# Patient Record
Sex: Female | Born: 1975 | Hispanic: Yes | Marital: Married | Smoking: Never smoker
Health system: Southern US, Community
[De-identification: ages and names within clinical notes are randomized; demographics above are authoritative.]

## PROBLEM LIST (undated history)

## (undated) ENCOUNTER — Emergency Department: Admission: EM | Discharge status: Absent without leave | Payer: BLUE CROSS/BLUE SHIELD

## (undated) DIAGNOSIS — E119 Type 2 diabetes mellitus without complications: Secondary | ICD-10-CM

## (undated) DIAGNOSIS — E781 Pure hyperglyceridemia: Secondary | ICD-10-CM

## (undated) DIAGNOSIS — K859 Acute pancreatitis without necrosis or infection, unspecified: Secondary | ICD-10-CM

## (undated) HISTORY — PX: ABDOMINAL HYSTERECTOMY: SHX81

## (undated) HISTORY — PX: LAPAROSCOPIC UNILATERAL SALPINGO OOPHERECTOMY: SHX5935

---

## 2004-12-04 ENCOUNTER — Emergency Department: Payer: Self-pay | Admitting: Emergency Medicine

## 2004-12-05 ENCOUNTER — Ambulatory Visit: Payer: Self-pay | Admitting: Emergency Medicine

## 2004-12-06 ENCOUNTER — Ambulatory Visit: Payer: Self-pay | Admitting: Internal Medicine

## 2007-12-07 ENCOUNTER — Emergency Department: Payer: Self-pay | Admitting: Unknown Physician Specialty

## 2011-04-27 ENCOUNTER — Emergency Department: Payer: Self-pay | Admitting: *Deleted

## 2011-04-27 LAB — COMPREHENSIVE METABOLIC PANEL
Albumin: 4 g/dL (ref 3.4–5.0)
Alkaline Phosphatase: 82 U/L (ref 50–136)
Anion Gap: 13 (ref 7–16)
BUN: 9 mg/dL (ref 7–18)
Bilirubin,Total: 0.3 mg/dL (ref 0.2–1.0)
Calcium, Total: 9.1 mg/dL (ref 8.5–10.1)
Chloride: 106 mmol/L (ref 98–107)
Creatinine: 0.65 mg/dL (ref 0.60–1.30)
EGFR (African American): >60
EGFR (Non-African Amer.): >60
Glucose: 102 mg/dL — ABNORMAL HIGH (ref 65–99)
Potassium: 3.6 mmol/L (ref 3.5–5.1)
SGPT (ALT): 41 U/L
Total Protein: 8.7 g/dL — ABNORMAL HIGH (ref 6.4–8.2)

## 2011-04-27 LAB — CBC
HGB: 12.7 g/dL (ref 12.0–16.0)
MCH: 29.7 pg (ref 26.0–34.0)
MCHC: 33.8 g/dL (ref 32.0–36.0)
MCV: 88 fL (ref 80–100)
Platelet: 336 10*3/uL (ref 150–440)

## 2011-07-03 ENCOUNTER — Emergency Department: Payer: Self-pay | Admitting: Emergency Medicine

## 2012-06-15 ENCOUNTER — Emergency Department: Payer: Self-pay | Admitting: Emergency Medicine

## 2013-11-01 ENCOUNTER — Emergency Department: Payer: Self-pay | Admitting: Emergency Medicine

## 2013-11-01 LAB — CBC WITH DIFFERENTIAL/PLATELET
BASOS ABS: 0.2 10*3/uL — AB (ref 0.0–0.1)
Basophil %: 1.2 %
EOS ABS: 0.2 10*3/uL (ref 0.0–0.7)
Eosinophil %: 1.2 %
HCT: 37.2 % (ref 35.0–47.0)
HGB: 12.6 g/dL (ref 12.0–16.0)
LYMPHS ABS: 3 10*3/uL (ref 1.0–3.6)
Lymphocyte %: 21.7 %
MCH: 29.7 pg (ref 26.0–34.0)
MCHC: 33.9 g/dL (ref 32.0–36.0)
MCV: 88 fL (ref 80–100)
Monocyte #: 0.7 x10 3/mm (ref 0.2–0.9)
Monocyte %: 5.1 %
NEUTROS PCT: 70.8 %
Neutrophil #: 9.8 10*3/uL — ABNORMAL HIGH (ref 1.4–6.5)
PLATELETS: 311 10*3/uL (ref 150–440)
RBC: 4.24 10*6/uL (ref 3.80–5.20)
RDW: 13 % (ref 11.5–14.5)
WBC: 13.9 10*3/uL — ABNORMAL HIGH (ref 3.6–11.0)

## 2013-11-01 LAB — COMPREHENSIVE METABOLIC PANEL
ALBUMIN: 2 g/dL — AB (ref 3.4–5.0)
Anion Gap: 9 (ref 7–16)
Bilirubin,Total: 0.8 mg/dL (ref 0.2–1.0)
CHLORIDE: 101 mmol/L (ref 98–107)
CO2: 24 mmol/L (ref 21–32)
GLUCOSE: 333 mg/dL — AB (ref 65–99)
Potassium: 4.7 mmol/L (ref 3.5–5.1)
SGOT(AST): 328 U/L — ABNORMAL HIGH (ref 15–37)
SGPT (ALT): 121 U/L — ABNORMAL HIGH
Sodium: 134 mmol/L — ABNORMAL LOW (ref 136–145)
Total Protein: 8 g/dL (ref 6.4–8.2)

## 2013-11-01 LAB — LIPASE, BLOOD: Lipase: 316 U/L (ref 73–393)

## 2013-11-01 LAB — TROPONIN I

## 2014-10-21 ENCOUNTER — Encounter: Payer: Self-pay | Admitting: Medical Oncology

## 2014-10-21 ENCOUNTER — Emergency Department: Payer: No Typology Code available for payment source

## 2014-10-21 ENCOUNTER — Emergency Department
Admission: EM | Admit: 2014-10-21 | Discharge: 2014-10-21 | Disposition: A | Discharge status: Home / Self Care | Payer: No Typology Code available for payment source | Attending: Emergency Medicine | Admitting: Emergency Medicine

## 2014-10-21 DIAGNOSIS — Z880 Allergy status to penicillin: Secondary | ICD-10-CM | POA: Insufficient documentation

## 2014-10-21 DIAGNOSIS — K5792 Diverticulitis of intestine, part unspecified, without perforation or abscess without bleeding: Secondary | ICD-10-CM | POA: Insufficient documentation

## 2014-10-21 DIAGNOSIS — F419 Anxiety disorder, unspecified: Secondary | ICD-10-CM | POA: Diagnosis not present

## 2014-10-21 DIAGNOSIS — R1032 Left lower quadrant pain: Secondary | ICD-10-CM | POA: Diagnosis present

## 2014-10-21 LAB — COMPREHENSIVE METABOLIC PANEL
ALBUMIN: 4.2 g/dL (ref 3.5–5.0)
ALT: 43 U/L (ref 14–54)
AST: 41 U/L (ref 15–41)
Alkaline Phosphatase: 67 U/L (ref 38–126)
Anion gap: 11 (ref 5–15)
BUN: 9 mg/dL (ref 6–20)
CHLORIDE: 104 mmol/L (ref 101–111)
CO2: 24 mmol/L (ref 22–32)
CREATININE: 0.46 mg/dL (ref 0.44–1.00)
Calcium: 9.5 mg/dL (ref 8.9–10.3)
GFR calc Af Amer: >60 mL/min (ref >60)
GLUCOSE: 113 mg/dL — AB (ref 65–99)
POTASSIUM: 3.8 mmol/L (ref 3.5–5.1)
SODIUM: 139 mmol/L (ref 135–145)
Total Bilirubin: 0.6 mg/dL (ref 0.3–1.2)
Total Protein: 8.1 g/dL (ref 6.5–8.1)

## 2014-10-21 LAB — URINALYSIS COMPLETE WITH MICROSCOPIC (ARMC ONLY)
BACTERIA UA: NONE SEEN
BILIRUBIN URINE: NEGATIVE
GLUCOSE, UA: NEGATIVE mg/dL
HGB URINE DIPSTICK: NEGATIVE
Ketones, ur: NEGATIVE mg/dL
Leukocytes, UA: NEGATIVE
Nitrite: NEGATIVE
PH: 5 (ref 5.0–8.0)
Protein, ur: NEGATIVE mg/dL
Specific Gravity, Urine: 1.025 (ref 1.005–1.030)

## 2014-10-21 LAB — CBC
HEMATOCRIT: 38.6 % (ref 35.0–47.0)
Hemoglobin: 12.8 g/dL (ref 12.0–16.0)
MCH: 29.4 pg (ref 26.0–34.0)
MCHC: 33.3 g/dL (ref 32.0–36.0)
MCV: 88.3 fL (ref 80.0–100.0)
PLATELETS: 275 10*3/uL (ref 150–440)
RBC: 4.37 MIL/uL (ref 3.80–5.20)
RDW: 12.6 % (ref 11.5–14.5)
WBC: 9.3 10*3/uL (ref 3.6–11.0)

## 2014-10-21 LAB — LIPASE, BLOOD: LIPASE: 24 U/L (ref 22–51)

## 2014-10-21 MED ORDER — MORPHINE SULFATE (PF) 4 MG/ML IV SOLN
4.0000 mg | Freq: Once | INTRAVENOUS | Status: AC
  Administered 2014-10-21: 4 mg via INTRAVENOUS
  Filled 2014-10-21: qty 1

## 2014-10-21 MED ORDER — METRONIDAZOLE 500 MG PO TABS: 500.0000 mg | ORAL_TABLET | Freq: Three times a day (TID) | ORAL | Status: AC

## 2014-10-21 MED ORDER — CIPROFLOXACIN HCL 500 MG PO TABS
500.0000 mg | ORAL_TABLET | ORAL | Status: AC
  Administered 2014-10-21: 500 mg via ORAL
  Filled 2014-10-21: qty 1

## 2014-10-21 MED ORDER — ONDANSETRON HCL 4 MG PO TABS: ORAL_TABLET | ORAL | Status: DC

## 2014-10-21 MED ORDER — HYDROCODONE-ACETAMINOPHEN 5-325 MG PO TABS: 1.0000 | ORAL_TABLET | ORAL | Status: DC | PRN

## 2014-10-21 MED ORDER — IOHEXOL 300 MG/ML  SOLN
100.0000 mL | Freq: Once | INTRAMUSCULAR | Status: AC | PRN
  Administered 2014-10-21: 100 mL via INTRAVENOUS

## 2014-10-21 MED ORDER — ONDANSETRON HCL 4 MG/2ML IJ SOLN
4.0000 mg | INTRAMUSCULAR | Status: AC
  Administered 2014-10-21: 4 mg via INTRAVENOUS
  Filled 2014-10-21: qty 2

## 2014-10-21 MED ORDER — CIPROFLOXACIN HCL 500 MG PO TABS: 500.0000 mg | ORAL_TABLET | Freq: Two times a day (BID) | ORAL | Status: AC

## 2014-10-21 MED ORDER — IOHEXOL 240 MG/ML SOLN
25.0000 mL | Freq: Once | INTRAMUSCULAR | Status: AC | PRN
  Administered 2014-10-21: 25 mL via ORAL

## 2014-10-21 MED ORDER — METRONIDAZOLE 500 MG PO TABS
500.0000 mg | ORAL_TABLET | Freq: Once | ORAL | Status: AC
  Administered 2014-10-21: 500 mg via ORAL
  Filled 2014-10-21: qty 1

## 2014-10-21 NOTE — ED Notes (Signed)
Via interpreter: Pt reports that she was recently diagnosed with a vaginal infection at [facility], was placed on abx. Since then pt has been having worsening left sided lower abd pain that is now radiating to left flank.

## 2014-10-21 NOTE — Discharge Instructions (Signed)
Diverticulitis (Diverticulitis) La diverticulitis es la inflamacin o infeccin de pequeas bolsas en el colon que se forman por una afeccin llamada diverticulosis. Las bolsas en el colon se denominan divertculos. El colon, o intestino grueso, es el lugar donde se absorbe agua y se forman las heces. Entre las complicaciones de la diverticulitis, se incluyen:  Hemorragias.  Infeccin grave.  Dolor intenso.  Perforacin del colon.  Obstruccin del colon. CAUSAS  La diverticulitis est causada por bacterias y La diverticulitis se produce cuando queda materia fecal retenida en los divertculos. Esto permite que crezcan bacterias en los divertculos, lo que puede llevar a la inflamacin e infeccin. FACTORES DE RIESGO Las personas con diverticulosis corren riesgo de presentar diverticulitis. Las dietas que no incluyen suficiente fibra proveniente de frutas y vegetales pueden predisponer a la diverticulitis. SNTOMAS  Entre los sntomas de diverticulitis, se incluyen:  Dolor y molestias en el abdomen. El dolor en general aparece en el lado izquierdo del abdomen, pero puede aparecer en otras zonas.  Fiebre o escalofros  [place].  Clicos.  Nuseas.  Vmitos.  Estreimiento.  Diarrea.  Sangre en la materia fecal. DIAGNSTICO  El mdico le preguntar acerca de sus antecedentes mdicos y le har un examen fsico. Es posible que le hagan estudios, porque hay muchas enfermedades que pueden causar los mismos sntomas que la diverticulitis. Los estudios pueden incluir:  Anlisis de sangre.  Anlisis de orina.  Estudios por imgenes del abdomen, entre ellos, radiografas y tomografas computarizadas. Cuando la afeccin est controlada, el mdico puede recomendarle que se haga una colonoscopa. La colonoscopa puede mostrar la gravedad de los divertculos y si hay algo ms que est causando los sntomas. TRATAMIENTO  La mayora de los casos de diverticulitis son leves y pueden  tratarse en el hogar. El tratamiento puede incluir:  Tomar medicamentos para el dolor de venta libre.  Seguir una dieta lquida absoluta.  Tomar antibiticos por va oral durante 7a 10das. Los casos ms graves pueden tratarse en el hospital. El tratamiento puede incluir:  No comer ni beber nada.  Tomar medicamentos para el dolor recetados.  Recibir antibiticos a travs de una va IV.  Recibir lquidos y nutricin a travs de una va IV.  Ciruga. INSTRUCCIONES PARA EL CUIDADO EN EL HOGAR   Siga cuidadosamente las indicaciones del mdico.  Siga las indicaciones del mdico acerca de si debe consumir una dieta lquida o de otro tipo. Cuando los sntomas mejoren, el mdico puede indicarle que modifique la dieta y recomendarle que consuma una dieta con alto contenido de fibra. Las frutas y los vegetales son buenas fuentes de fibra. La fibra facilita la eliminacin de heces.  Tome los suplementos con fibra o los probiticos segn las indicaciones del mdico.  Tome los medicamentos solamente como se lo haya indicado el mdico.  Cumpla con todas las visitas de control. SOLICITE ATENCIN MDICA SI:   El dolor no mejora.  Le resulta difcil alimentarse.  Los movimientos intestinales no se normalizan. SOLICITE ATENCIN MDICA DE INMEDIATO SI:   El dolor empeora.  Los sntomas no mejoran.  Los sntomas empeoran repentinamente.  Tiene fiebre.  Ha vomitado repetidas veces.  La materia fecal es sanguinolenta o negra, de aspecto alquitranado. ASEGRESE DE QUE:   Comprende estas instrucciones.  Controlar su afeccin.  Recibir ayuda de inmediato si no mejora o si empeora. Document Released: 11/20/2004 Document Revised: 02/15/2013 [employer] Patient Information 2015 [employer]. This information is not intended to replace advice given to you by your health care   provider. Make sure you discuss any questions you have with your health care provider. ° °

## 2014-10-21 NOTE — ED Provider Notes (Signed)
[facility] Emergency Department Provider Note  ____________________________________________  Time seen: Approximately 6:12 PM  I have reviewed the triage vital signs and the nursing notes.   HISTORY  Chief Complaint Abdominal Pain  A hospital Spanish interpreter was utilized for this patient encounter  HPI Sabrina Hanna is a 39 y.o. female with a past medical history of ovarian cysts and fibroids status post hysterectomy and right-sided oophorectomy about 2 years ago at [facility].  She presents now with 2 weeks of gradual onset but worsening left lower abdominal pain that is radiating to her left flank.  She was recently treated for a vaginal infection at [facility] but continues to have pain.  She is not having any vaginal discharge or vaginal bleeding.She denies dysuria and states that her urine does not have a foul order odor.  She is had some nausea but no vomiting.  She is very concerned and anxious about the pain and her husband is concerned that she may have cancer.  Movement makes the pain worse and nothing makes it better.  She has been taking ibuprofen but it is not helping her.   History reviewed. No pertinent past medical history.  There are no active problems to display for this patient.   Past Surgical History  Procedure Laterality Date  . Abdominal hysterectomy    . Laparoscopic unilateral salpingo oopherectomy Right     Current Outpatient Rx  Name  Route  Sig  Dispense  Refill  . ciprofloxacin (CIPRO) 500 MG tablet   Oral   Take 1 tablet (500 mg total) by mouth 2 (two) times daily.   20 tablet   0   . HYDROcodone-acetaminophen (NORCO/VICODIN) 5-325 MG per tablet   Oral   Take 1-2 tablets by mouth every 4 (four) hours as needed for moderate pain.   15 tablet   0   . metroNIDAZOLE (FLAGYL) 500 MG tablet   Oral   Take 1 tablet (500 mg total) by mouth 3 (three) times daily.   30 tablet   0   . ondansetron (ZOFRAN) 4 MG tablet     Take 1-2 tabs by mouth every 8 hours as needed for nausea/vomiting   30 tablet   0     Allergies Penicillins  History reviewed. No pertinent family history.  Social History Social History  Substance Use Topics  . Smoking status: Never Smoker   . Smokeless tobacco: None  . Alcohol Use: No    Review of Systems Constitutional: Intermittent subjective fever/chills Eyes: No visual changes. ENT: No sore throat. Cardiovascular: Denies chest pain. Respiratory: Denies shortness of breath. Gastrointestinal: Severe gradual worsening left lower quadrant abdominal pain with nausea, no vomiting.  No diarrhea.  No constipation. Genitourinary: Negative for dysuria. Musculoskeletal: Negative for back pain. Skin: Negative for rash. Neurological: Negative for headaches, focal weakness or numbness.  10-point ROS otherwise negative.  ____________________________________________   PHYSICAL EXAM:  VITAL SIGNS: ED Triage Vitals  Enc Vitals Group     BP 10/21/14 1609 115/75 mmHg     Pulse Rate 10/21/14 1609 76     Resp 10/21/14 1609 18     Temp 10/21/14 1609 97.7 F (36.5 C)     Temp Source 10/21/14 1609 Oral     SpO2 10/21/14 1609 98 %     Weight 10/21/14 1609 161 lb (73.029 kg)     Height 10/21/14 1609 5\' 2"  (1.575 m)     Head Cir --      Peak Flow --  Pain Score 10/21/14 1610 10     Pain Loc --      Pain Edu? --      Excl. in GC? --     Constitutional: Alert and oriented. Well appearing and in no acute distress.  Anxious Eyes: Conjunctivae are normal. PERRL. EOMI. Head: Atraumatic. Nose: No congestion/rhinnorhea. Mouth/Throat: Mucous membranes are moist.  Oropharynx non-erythematous. Neck: No stridor.   Cardiovascular: Normal rate, regular rhythm. Grossly normal heart sounds.  Good peripheral circulation. Respiratory: Normal respiratory effort.  No retractions. Lungs CTAB. Gastrointestinal: Soft with tenderness to palpation primarily in the suprapubic region and left  lower quadrant.  Guarding, no rebound. Musculoskeletal: No lower extremity tenderness nor edema.  No joint effusions. Neurologic:  Normal speech and language. No gross focal neurologic deficits are appreciated.  Skin:  Skin is warm, dry and intact. No rash noted.   ____________________________________________   LABS (all labs ordered are listed, but only abnormal results are displayed)  Labs Reviewed  COMPREHENSIVE METABOLIC PANEL - Abnormal; Notable for the following:    Glucose, Bld 113 (*)    All other components within normal limits  URINALYSIS COMPLETEWITH MICROSCOPIC ([facility] ONLY) - Abnormal; Notable for the following:    Color, Urine YELLOW (*)    APPearance CLEAR (*)    Squamous Epithelial / LPF 0-5 (*)    All other components within normal limits  LIPASE, BLOOD  CBC   ____________________________________________  EKG  Not indicated ____________________________________________  RADIOLOGY   Ct Abdomen Pelvis W Contrast  10/21/2014   CLINICAL DATA:  LEFT lower quadrant pain, recently diagnosed with vaginal infection, on antibiotics but pain has increased, past history of partial hysterectomy and RIGHT oophorectomy  EXAM: CT ABDOMEN AND PELVIS WITH CONTRAST  TECHNIQUE: Multidetector CT imaging of the abdomen and pelvis was performed using the standard protocol following bolus administration of intravenous contrast.  CONTRAST:  OMNIPAQUE IOHEXOL 300 MG/ML SOLN IV. Dilute oral contrast.  COMPARISON:  11/01/2013  FINDINGS: Lung bases clear.  Diffuse fatty infiltration of liver.  Liver, spleen, gallbladder, pancreas, kidneys, and adrenal glands normal.  Focal pericolic inflammation identified adjacent to the descending colon with note of a few scattered colonic diverticula, question diverticulitis.  No evidence of perforation or abscess.  Stomach and remaining bowel loops unremarkable.  Uterus surgically absent with nonvisualization of LEFT ovary ; RIGHT ovary is grossly  normal in appearance and unchanged.  Unremarkable bladder and ureters.  No mass, adenopathy, free air, free fluid, hernia, or acute bone lesion.  IMPRESSION: Minimal wall thickening and pericolic inflammatory changes at the mid descending colon most consistent with acute diverticulitis.  Fatty infiltration of liver.   Electronically Signed   By: Ulyses Southward M.D.   On: 10/21/2014 19:47    ____________________________________________   PROCEDURES  Procedure(s) performed: None  Critical Care performed: No ____________________________________________   INITIAL IMPRESSION / ASSESSMENT AND PLAN / ED COURSE  Pertinent labs & imaging results that were available during my care of the patient were reviewed by me and considered in my medical decision making (see chart for details).  The patient's CT scan is consistent with acute diverticulitis with no free air or abscess formation.  Her lab work was all reassuring including no evidence of UTI and no evidence of systemic infection (no leukocytosis).  Using the Spanish interpreter I updated the patient and her husband about the findings and explained that I was prescribing antibiotics and pain medicine.  I explained that she should return to the emergency  department if she gets worse or if she is not improved significantly within a few days.  They understand and agree with the plan.  ____________________________________________  FINAL CLINICAL IMPRESSION(S) / ED DIAGNOSES  Final diagnoses:  Acute diverticulitis      NEW MEDICATIONS STARTED DURING THIS VISIT:  Discharge Medication List as of 10/21/2014  8:44 PM    START taking these medications   Details  ciprofloxacin (CIPRO) 500 MG tablet Take 1 tablet (500 mg total) by mouth 2 (two) times daily., Starting 10/21/2014, Until Sat 10/28/14, Print    HYDROcodone-acetaminophen (NORCO/VICODIN) 5-325 MG per tablet Take 1-2 tablets by mouth every 4 (four) hours as needed for moderate pain., Starting  10/21/2014, Until Discontinued, Print    metroNIDAZOLE (FLAGYL) 500 MG tablet Take 1 tablet (500 mg total) by mouth 3 (three) times daily., Starting 10/21/2014, Until Sat 11/04/14, Print    ondansetron (ZOFRAN) 4 MG tablet Take 1-2 tabs by mouth every 8 hours as needed for nausea/vomiting, Print         Loleta Rose, MD 10/21/14 2230

## 2014-10-23 ENCOUNTER — Encounter: Payer: Self-pay | Admitting: Emergency Medicine

## 2014-10-23 ENCOUNTER — Emergency Department
Admission: EM | Admit: 2014-10-23 | Discharge: 2014-10-23 | Disposition: A | Discharge status: Home / Self Care | Payer: No Typology Code available for payment source | Attending: Emergency Medicine | Admitting: Emergency Medicine

## 2014-10-23 DIAGNOSIS — G8929 Other chronic pain: Secondary | ICD-10-CM | POA: Insufficient documentation

## 2014-10-23 DIAGNOSIS — Z880 Allergy status to penicillin: Secondary | ICD-10-CM | POA: Diagnosis not present

## 2014-10-23 DIAGNOSIS — R1030 Lower abdominal pain, unspecified: Secondary | ICD-10-CM | POA: Insufficient documentation

## 2014-10-23 DIAGNOSIS — R109 Unspecified abdominal pain: Secondary | ICD-10-CM | POA: Diagnosis present

## 2014-10-23 LAB — COMPREHENSIVE METABOLIC PANEL
ALBUMIN: 4.1 g/dL (ref 3.5–5.0)
ALK PHOS: 72 U/L (ref 38–126)
ALT: 55 U/L — ABNORMAL HIGH (ref 14–54)
AST: 68 U/L — AB (ref 15–41)
Anion gap: 13 (ref 5–15)
BILIRUBIN TOTAL: 0.7 mg/dL (ref 0.3–1.2)
BUN: 9 mg/dL (ref 6–20)
CALCIUM: 9 mg/dL (ref 8.9–10.3)
CO2: 19 mmol/L — ABNORMAL LOW (ref 22–32)
Chloride: 105 mmol/L (ref 101–111)
Creatinine, Ser: 0.64 mg/dL (ref 0.44–1.00)
GFR calc Af Amer: >60 mL/min (ref >60)
GLUCOSE: 322 mg/dL — AB (ref 65–99)
Potassium: 4.5 mmol/L (ref 3.5–5.1)
Sodium: 137 mmol/L (ref 135–145)
TOTAL PROTEIN: 7.8 g/dL (ref 6.5–8.1)

## 2014-10-23 LAB — CBC WITH DIFFERENTIAL/PLATELET
BASOS ABS: 0 10*3/uL (ref 0–0.1)
BASOS PCT: 0 %
Eosinophils Absolute: 0.1 10*3/uL (ref 0–0.7)
Eosinophils Relative: 2 %
HEMATOCRIT: 39.5 % (ref 35.0–47.0)
HEMOGLOBIN: 13.1 g/dL (ref 12.0–16.0)
LYMPHS PCT: 27 %
Lymphs Abs: 1.9 10*3/uL (ref 1.0–3.6)
MCH: 29.8 pg (ref 26.0–34.0)
MCHC: 33.2 g/dL (ref 32.0–36.0)
MCV: 89.6 fL (ref 80.0–100.0)
MONOS PCT: 5 %
Monocytes Absolute: 0.3 10*3/uL (ref 0.2–0.9)
NEUTROS ABS: 4.6 10*3/uL (ref 1.4–6.5)
NEUTROS PCT: 66 %
Platelets: 234 10*3/uL (ref 150–440)
RBC: 4.41 MIL/uL (ref 3.80–5.20)
RDW: 12.8 % (ref 11.5–14.5)
WBC: 7 10*3/uL (ref 3.6–11.0)

## 2014-10-23 LAB — LIPASE, BLOOD: Lipase: 29 U/L (ref 22–51)

## 2014-10-23 MED ORDER — HYDROMORPHONE HCL 1 MG/ML IJ SOLN
1.0000 mg | Freq: Once | INTRAMUSCULAR | Status: AC
  Administered 2014-10-23: 1 mg via INTRAVENOUS
  Filled 2014-10-23: qty 1

## 2014-10-23 NOTE — ED Notes (Signed)
Pt presents with abd pain since Saturday. Pt was seen here on Saturday for same and dx with diverticulitis. Pt was given zofran, flagyl, cipro and hydrocodone at time of disacharge. Was told to return if no better in 48hrs.

## 2014-10-23 NOTE — ED Notes (Signed)
Interpreter here to help with discharge of patient. Patient and spouse asked questions and were given answers via interpreter.

## 2014-10-23 NOTE — ED Notes (Signed)
Patient medicated for pain. Resting quietly, watching TV, husband at bedside.

## 2014-10-23 NOTE — Discharge Instructions (Signed)
Dolor abdominal en las mujeres  (Abdominal Pain, Women)  El dolor abdominal (en el estómago, la pelvis o el vientre) puede tener muchas causas. Es importante que le informe a su médico:  · La ubicación del dolor.  · ¿Viene y se va, o persiste todo el tiempo?  · ¿Hay situaciones que inician el dolor (comer ciertos alimentos, la actividad física)?  · ¿Tiene otros síntomas asociados al dolor (fiebre, náuseas, vómitos, diarrea)?  Todo es de gran ayuda cuando se trata de hallar la causa del dolor.  CAUSAS  · Estómago: Infecciones por virus o bacterias, o úlcera.  · Intestino: Apendicitis (apéndice inflamado), ileitis regional (enfermedad de Crohn), colitis ulcerosa (colon inflamado), síndrome del colon irritable, diverticulitis (inflamación de los divertículos del colon) o cáncer de estómago oo intestino.  · Enfermedades de la vesícula biliar o cálculos.  · Enfermedades renales, cálculos o infecciones en el riñón.  · Infección o cáncer del páncreas.  · Fibromialgia (trastorno doloroso)  · Enfermedades de los órganos femeninos:  · Uterus: Útero: fibroma (tumor no canceroso) o infección  · Trompas de [place]: infección o embarazo ectópico  · En los ovarios, quistes o tumores.  · Adherencias pélvicas (tejido cicatrizal).  · Endometriosis (el tejido que cubre el útero se desarrolla en la pelvis y los órganos pélvicos).  · Síndrome de congestión pélvica (los órganos femeninos se llenan de sangre antes del periodo menstrual(  · Dolor durante el periodo menstrual.  · Dolor durante la ovulación (al producir óvulos).  · Dolor al usar el DIU (dispositivo intrauterino para el control de la natalidad)  · Cáncer en los órganos femeninos.  · Dolor funcional (no está originado en una enfermedad, puede mejorar sin tratamiento).  · Dolor de origen psicológico  · Depresión.  DIAGNÓSTICO  Su médico decidirá la gravedad del dolor a través del examen físico  · Análisis de sangre  · Radiografías  · Ecografías  · TC (tomografía computada, tipo  especial de radiografías).  · IMR (resonancia magnética)  · Cultivos, en el caso una infección  · Colon por enema de bario (se inserta una sustancia de contraste en el intestino grueso para mejorar la observación con rayos X.)  · Colonoscopía (observación del intestino con un tubo luminoso).  · Laparoscopía (examen del interior del abdomen con un tubo que tiene una luz).  · Cirugía exploratoria abdominal mayor (se observa el abdomen realizando una gran incisión).  TRATAMIENTO  El tratamiento dependerá de la causa del problema.   · Muchos de estos casos pueden controlarse y tratarse en casa.  · Medicamentos de venta libre indicados por el médico.  · Medicamentos con receta.  · Antibióticos, en caso de infección  · Píldoras anticonceptivas, en el caso de períodos dolorosos o dolor al ovular.  · Tratamiento hormonal, para la endometriosis  · Inyecciones para bloqueo nervioso selectivo.  · Fisioterapia.  · Antidepresivos.  · Consejos por parte de un psícólogo o psiquiatra.  · Cirugía mayor o menor.  INSTRUCCIONES PARA EL CUIDADO DOMICILIARIO  · No tome ni administre laxantes a menos que se lo haya indicado su médico.  · Tome analgésicos de venta libre sólo si se lo ha indicado el profesional que lo asiste. No tome aspirina, ya que puede causar molestias en el estómago o hemorragias.  · Consuma una dieta líquida (caldo o agua) según lo indicado por el médico. Progrese lentamente a una dieta blanda, según la tolerancia, si el dolor se relaciona con el estómago o el intestino.  ·   no se [employer] o la [place] tratar con:  Acupuntura.  Ejercicios de relajacin (yoga,  meditacin).  Terapia grupal.  Psicoterapia. SOLICITE ATENCIN MDICA SI:  Nota que ciertos Pharmacist, community de [place].  El tratamiento indicado para Arboriculturist no Marketing executive.  Necesita analgsicos ms fuertes.  Quiere que le retiren el DIU.  Si se siente confundido o desfalleciente.  Presenta nuseas o vmitos.  Aparece una erupcin cutnea.  Sufre efectos adversos o una reaccin alrgica debido a los medicamentos que toma. SOLICITE ATENCIN MDICA DE INMEDIATO SI:  El dolor persiste o se agrava.  Tiene fiebre.  Siente el dolor slo en algunos sectores del abdomen. Si se localiza en la zona derecha, posiblemente podra tratarse de apendicitis. En un adulto, si se localiza en la regin inferior izquierda del abdomen, podra tratarse de colitis o diverticulitis.  Hay sangre en las heces (deposiciones de color rojo brillante o negro alquitranado), con o sin vmitos.  Usted presenta sangre en la orina.  Siente escalofros con o sin fiebre.  Se desmaya. ASEGRESE QUE:   Comprende estas instrucciones.  Controlar su enfermedad.  Solicitar ayuda de inmediato si no mejora o si empeora. Document Released: 05/29/2008 Document Revised: 05/05/2011 [facility] Patient Information 2015 [place], [place]. This information is not intended to replace advice given to you by your health care provider. Make sure you discuss any questions you have with your health care provider. Dolor crnico (Chronic Pain) El dolor crnico puede definirse como aquel que aparece y desaparece, y dura entre 3 y 6 meses o ms. Puede tener muchas causas, lo que hace difcil realizar un diagnstico. Hay muchas opciones de tratamiento disponibles. Sin embargo, Editor, commissioning un tratamiento que resulte bien para usted puede requerir diferentes abordajes hasta que se encuentre el [place]. Muchas personas se benefician con una combinacin de uno o ms tipos de tratamiento para Museum/gallery conservator. SNTOMAS  El dolor crnico puede producirse en cualquier parte del organismo y puede variar desde leve a muy intenso. Algunos tipos de dolor crnico son:  Dolor de [place].  Dolor lumbar.  Dolor por cncer.  Dolor por artritis.  Dolor neurognico. Este ltimo dolor es el resultado de lesiones nerviosas. Las personas que sufren dolor crnico tambin pueden tener otros sntomas como:  Depresin.  Enojo.  Insomnio.  Ansiedad. DIAGNSTICO  El medico har el diagnstico de la afeccin luego de un [place]. En muchos casos, el foco inicial estar en excluir trastornos que sean causa de dolor. El mdico indicar algunos anlisis para diagnosticar el problema, segn los sntomas que [place]. Entre estas pruebas se incluyen:   Anlisis de [place].   Tomografa computada.   Resonancia magntica.   Radiografas.   Ecografas.   Estudios de [employer].  Es posible que deba consultar a Music therapist.  TRATAMIENTO  Hallar un tratamiento que de resultado puede llevar mucho tiempo. Posiblemente lo derivarn a un especialista. El especialista podr indicarle medicamentos o terapias, como:   Meditacin de atencin plena o yoga.  Medicamentos o inyecciones de anestsicos o analgsicos en la columna vertebral o en la zona que la rodea.  Estimulacin elctrica local.  Acupuntura.   Terapia de masajes.   Terapia con aromas, colores, luz o sonidos.   Biorretroalimentacin.   Trabajo con un fisioterapeuta para Technical sales engineer rigidez.   Ejercicios regulares y suaves.   Terapia cognitivo conductual.   Grupos de apoyo.  En algunos casos podr indicarse la [place].  INSTRUCCIONES  PARA EL CUIDADO EN EL HOGAR   Tome todos los [employer] como le indic el mdico.   Disminuya el estrs en su vida relajndose y haciendo ciertas cosas, como escuchar msica tranquila.   Haga ejercicios o actividades segn las indicaciones del mdico.   Consuma una  dieta saludable e incluya vegetales, frutas, pescado y carnes [place].   Cumpla con todas las visitas de control, segn le indique su mdico.   Asista a un grupo de apoyo con otras personas que sufren de dolor crnico. SOLICITE ATENCIN MDICA SI:   El dolor empeora.   Siente un nuevo dolor, que no tena antes.   No tolera los medicamentos que le indic el mdico.   Presenta nuevos sntomas desde la ltima consulta al mdico.  SOLICITE ATENCIN MDICA DE INMEDIATO SI:   Se siente dbil.   Tiene menos sensibilidad o siente adormecimientos.   Pierde el control de la vejiga o del intestino.   El dolor empeora repentinamente.   Comienza a tener temblores.  Comienza a sentir escalofros.  Se siente confundido.  Siente dolor en el pecho.  Le falta el aire.  ASEGRESE DE QUE:  Comprende estas instrucciones.  Controlar su afeccin.  Recibir ayuda de inmediato si no mejora o si empeora. Document Released: 02/10/2005 Document Revised: 10/13/2012 [facility] Patient Information 2015 [place], [place]. This information is not intended to replace advice given to you by your health care provider. Make sure you discuss any questions you have with your health care provider.

## 2014-10-23 NOTE — ED Provider Notes (Signed)
[facility] Emergency Department Provider Note     Time seen: ----------------------------------------- 10:22 AM on 10/23/2014 -----------------------------------------    I have reviewed the triage vital signs and the nursing notes.   HISTORY  Chief Complaint Abdominal Pain    HPI Sabrina Hanna is a 39 y.o. female who presents to ER with abdominal pain since Saturday. Patient states she's been here on Saturday for same and was diagnosed with diverticulitis. She was given Zofran Flagyl Cipro and Vicodin. Was told to return if no better in 48 hours. Patient states she also has had some chronic abdominal pain is been seen at [facility] for same. Nothing makes her symptoms better or worse, also associated back pain.   History reviewed. No pertinent past medical history.  There are no active problems to display for this patient.   Past Surgical History  Procedure Laterality Date  . Abdominal hysterectomy    . Laparoscopic unilateral salpingo oopherectomy Right     Allergies Penicillins  Social History Social History  Substance Use Topics  . Smoking status: Never Smoker   . Smokeless tobacco: None  . Alcohol Use: No    Review of Systems Constitutional: Negative for fever. Eyes: Negative for visual changes. ENT: Negative for sore throat. Cardiovascular: Negative for chest pain. Respiratory: Negative for shortness of breath. Gastrointestinal: Positive for abdominal pain Genitourinary: Negative for dysuria. Musculoskeletal: Positive for back pain Skin: Negative for rash. Neurological: Negative for headaches, focal weakness or numbness.  10-point ROS otherwise negative.  ____________________________________________   PHYSICAL EXAM:  VITAL SIGNS: ED Triage Vitals  Enc Vitals Group     BP 10/23/14 1002 116/77 mmHg     Pulse Rate 10/23/14 1002 92     Resp 10/23/14 1002 18     Temp 10/23/14 1002 98.9 F (37.2 C)     Temp Source  10/23/14 1002 Oral     SpO2 10/23/14 1002 97 %     Weight --      Height --      Head Cir --      Peak Flow --      Pain Score 10/23/14 1003 8     Pain Loc --      Pain Edu? --      Excl. in GC? --     Constitutional: Alert and oriented. Well appearing and in no distress. Eyes: Conjunctivae are normal. PERRL. Normal extraocular movements. ENT   Head: Normocephalic and atraumatic.   Nose: No congestion/rhinnorhea.   Mouth/Throat: Mucous membranes are moist.   Neck: No stridor. Cardiovascular: Normal rate, regular rhythm. Normal and symmetric distal pulses are present in all extremities. No murmurs, rubs, or gallops. Respiratory: Normal respiratory effort without tachypnea nor retractions. Breath sounds are clear and equal bilaterally. No wheezes/rales/rhonchi. Gastrointestinal: Severe abdominal tenderness, out of proportion to exam. Worse on the left side of the abdomen.  Musculoskeletal: Nontender with normal range of motion in all extremities. No joint effusions.  No lower extremity tenderness nor edema. Neurologic:  Normal speech and language. No gross focal neurologic deficits are appreciated. Speech is normal. No gait instability. Skin:  Skin is warm, dry and intact. No rash noted. Psychiatric: Mood and affect are normal. Speech and behavior are normal. Patient exhibits appropriate insight and judgment.  ____________________________________________  ED COURSE:  Pertinent labs & imaging results that were available during my care of the patient were reviewed by me and considered in my medical decision making (see chart for details). Unclear etiology, likely chronic  pain management  ____________________________________________    LABS (pertinent positives/negatives)  Labs Reviewed  COMPREHENSIVE METABOLIC PANEL - Abnormal; Notable for the following:    CO2 19 (*)    Glucose, Bld 322 (*)    AST 68 (*)    ALT 55 (*)    All other components within normal limits   CBC WITH DIFFERENTIAL/PLATELET  LIPASE, BLOOD  URINALYSIS COMPLETEWITH MICROSCOPIC ([facility] ONLY)     ____________________________________________  FINAL ASSESSMENT AND PLAN  Chronic abdominal pain  Plan: Patient with labs and imaging as dictated above. Patient with chronic pain management, advised I do not have an exact etiology for her in terms of this time. She is stable for outpatient follow-up with [facility] chronic pelvic pain   Emily Filbert, MD   Emily Filbert, MD 10/23/14 [phone]

## 2014-10-23 NOTE — ED Notes (Signed)
Pt presents to ED with central and left side abdominal pain. Was seen here Saturday and instructed to return in 48 hours if the pain was worse. Pt states pain is constant but has worsened in the last month. Pt reports being seen at [facility] 2 weeks ago for a vaginal infection and has completed the antibiotics prescribed.

## 2015-10-05 ENCOUNTER — Emergency Department
Admission: EM | Admit: 2015-10-05 | Discharge: 2015-10-06 | Disposition: A | Discharge status: Home / Self Care | Payer: BLUE CROSS/BLUE SHIELD | Attending: Emergency Medicine | Admitting: Emergency Medicine

## 2015-10-05 ENCOUNTER — Emergency Department: Payer: BLUE CROSS/BLUE SHIELD

## 2015-10-05 ENCOUNTER — Encounter: Payer: Self-pay | Admitting: Emergency Medicine

## 2015-10-05 ENCOUNTER — Other Ambulatory Visit: Payer: Self-pay

## 2015-10-05 DIAGNOSIS — R079 Chest pain, unspecified: Secondary | ICD-10-CM

## 2015-10-05 DIAGNOSIS — R739 Hyperglycemia, unspecified: Secondary | ICD-10-CM

## 2015-10-05 DIAGNOSIS — E1165 Type 2 diabetes mellitus with hyperglycemia: Secondary | ICD-10-CM | POA: Diagnosis not present

## 2015-10-05 DIAGNOSIS — R0789 Other chest pain: Secondary | ICD-10-CM | POA: Diagnosis present

## 2015-10-05 HISTORY — DX: Type 2 diabetes mellitus without complications: E11.9

## 2015-10-05 LAB — TROPONIN I

## 2015-10-05 LAB — CBC
HCT: 35 % (ref 35.0–47.0)
Hemoglobin: 12.4 g/dL (ref 12.0–16.0)
MCH: 31.2 pg (ref 26.0–34.0)
MCHC: 35.5 g/dL (ref 32.0–36.0)
MCV: 87.8 fL (ref 80.0–100.0)
PLATELETS: 228 10*3/uL (ref 150–440)
RBC: 3.99 MIL/uL (ref 3.80–5.20)
RDW: 13 % (ref 11.5–14.5)
WBC: 12.4 10*3/uL — AB (ref 3.6–11.0)

## 2015-10-05 LAB — BASIC METABOLIC PANEL
Anion gap: 9 (ref 5–15)
BUN: 12 mg/dL (ref 6–20)
CHLORIDE: 102 mmol/L (ref 101–111)
CO2: 23 mmol/L (ref 22–32)
CREATININE: 0.65 mg/dL (ref 0.44–1.00)
Calcium: 8.7 mg/dL — ABNORMAL LOW (ref 8.9–10.3)
Glucose, Bld: 297 mg/dL — ABNORMAL HIGH (ref 65–99)
Potassium: 3.5 mmol/L (ref 3.5–5.1)
SODIUM: 134 mmol/L — AB (ref 135–145)

## 2015-10-05 MED ORDER — SODIUM CHLORIDE 0.9 % IV BOLUS (SEPSIS)
1000.0000 mL | Freq: Once | INTRAVENOUS | Status: AC
  Administered 2015-10-05: 1000 mL via INTRAVENOUS

## 2015-10-05 MED ORDER — GI COCKTAIL ~~LOC~~
ORAL | Status: AC
  Administered 2015-10-05: 30 mL via ORAL
  Filled 2015-10-05: qty 30

## 2015-10-05 MED ORDER — DIPHENHYDRAMINE HCL 50 MG/ML IJ SOLN
50.0000 mg | Freq: Once | INTRAMUSCULAR | Status: AC
Start: 2015-10-06 — End: 2015-10-05
  Administered 2015-10-05: 50 mg via INTRAVENOUS

## 2015-10-05 MED ORDER — GI COCKTAIL ~~LOC~~
30.0000 mL | Freq: Once | ORAL | Status: AC
  Administered 2015-10-05: 30 mL via ORAL

## 2015-10-05 NOTE — ED Triage Notes (Signed)
Pt. States chest pain and shortness of breath that started at around 1 pm today.  Pt. States substernal chest pain that does not radiate.  Pt. States hx of acid reflux.

## 2015-10-05 NOTE — ED Notes (Signed)
2 unsuccessful PIV attempts by this RN. 

## 2015-10-05 NOTE — ED Notes (Signed)
Called to pt's room for c/o trouble breathing and possible reaction to GI cocktail. Manson PasseyBrown, MD at bedside.

## 2015-10-05 NOTE — ED Notes (Signed)
Pt placed on 2L O2 via [place] for comfort. Pt's O2 sats at 100%.

## 2015-10-05 NOTE — ED Notes (Addendum)
Pt states she drank a Monster energy drink around 11am and thinks it might have caused her chest pain. Pt states she drank a Red Bull in the past which caused similar s/x's.

## 2015-10-06 LAB — TROPONIN I: Troponin I: <0.03 ng/mL (ref <0.03)

## 2015-10-06 LAB — GLUCOSE, CAPILLARY: GLUCOSE-CAPILLARY: 176 mg/dL — AB (ref 65–99)

## 2015-10-06 LAB — FIBRIN DERIVATIVES D-DIMER (ARMC ONLY): Fibrin derivatives D-dimer (ARMC): 187 (ref 0–499)

## 2015-10-06 MED ORDER — METFORMIN HCL 500 MG PO TABS: 500.0000 mg | ORAL_TABLET | Freq: Two times a day (BID) | ORAL | 0 refills | Status: DC

## 2015-10-06 MED ORDER — SODIUM CHLORIDE 0.9 % IV BOLUS (SEPSIS)
1000.0000 mL | Freq: Once | INTRAVENOUS | Status: AC
  Administered 2015-10-06: 1000 mL via INTRAVENOUS

## 2015-10-06 NOTE — ED Provider Notes (Signed)
[facility] Emergency Department Provider Note  ____________________________________________   First MD Initiated Contact with Patient 10/06/15 0000     (approximate)  I have reviewed the triage vital signs and the nursing notes.   HISTORY  Chief Complaint Chest Pain (Pt. presents to ED from triage for chest pain and shortness of breath.)    HPI Sabrina Hanna is a 40 y.o. female history GERD and diabetes (noncompliant with medication) presents with central chest pain described as pressure since 1 PM this afternoon. Patient denies any accompanying symptoms no dyspnea diaphoresis nausea or vomiting. Patient denies any B pain or swelling.   Past Medical History:  Diagnosis Date  . Diabetes mellitus without complication (HCC)     There are no active problems to display for this patient.   Past Surgical History:  Procedure Laterality Date  . ABDOMINAL HYSTERECTOMY    . LAPAROSCOPIC UNILATERAL SALPINGO OOPHERECTOMY Right     Prior to Admission medications   Medication Sig Start Date End Date Taking? Authorizing Provider  HYDROcodone-acetaminophen (NORCO/VICODIN) 5-325 MG per tablet Take 1-2 tablets by mouth every 4 (four) hours as needed for moderate pain. 10/21/14   Loleta Roseory Forbach, MD  ondansetron [facility](ZOFRAN) 4 MG tablet Take 1-2 tabs by mouth every 8 hours as needed for nausea/vomiting 10/21/14   Loleta Roseory Forbach, MD    Allergies Penicillins  No family history on file.  Social History Social History  Substance Use Topics  . Smoking status: Never Smoker  . Smokeless tobacco: Never Used  . Alcohol use No    Review of Systems Constitutional: No fever/chills Eyes: No visual changes. ENT: No sore throat. Cardiovascular: Positive for chest pain. Respiratory: Denies shortness of breath. Gastrointestinal: No abdominal pain.  No nausea, no vomiting.  No diarrhea.  No constipation. Genitourinary: Negative for dysuria. Musculoskeletal: Negative for  back pain. Skin: Negative for rash. Neurological: Negative for headaches, focal weakness or numbness.  10-point ROS otherwise negative.  ____________________________________________   PHYSICAL EXAM:  VITAL SIGNS: ED Triage Vitals  Enc Vitals Group     BP 10/05/15 2211 122/86     Pulse Rate 10/05/15 2211 70     Resp 10/05/15 2211 18     Temp 10/05/15 2211 97.5 F (36.4 C)     Temp src --      SpO2 10/05/15 2211 95 %     Weight 10/05/15 2213 164 lb (74.4 kg)     Height 10/05/15 2213 5' (1.524 m)     Head Circumference --      Peak Flow --      Pain Score 10/05/15 2213 7     Pain Loc --      Pain Edu? --      Excl. in GC? --     Constitutional: Alert and oriented. Well appearing and in no acute distress. Eyes: Conjunctivae are normal. PERRL. EOMI. Head: Atraumatic. Ears:  Healthy appearing ear canals and TMs bilaterally Nose: No congestion/rhinnorhea. Mouth/Throat: Mucous membranes are moist.  Oropharynx non-erythematous. Neck: No stridor.  No meningeal signs.   Cardiovascular: Normal rate, regular rhythm. Good peripheral circulation. Grossly normal heart sounds.   Respiratory: Normal respiratory effort.  No retractions. Lungs CTAB. Gastrointestinal: Soft and nontender. No distention.  Musculoskeletal: No lower extremity tenderness nor edema. No gross deformities of extremities. Neurologic:  Normal speech and language. No gross focal neurologic deficits are appreciated.  Skin:  Skin is warm, dry and intact. No rash noted. Psychiatric: Mood and affect are normal. Speech  and behavior are normal. ____________________________________________   LABS (all labs ordered are listed, but only abnormal results are displayed)  Labs Reviewed  BASIC METABOLIC PANEL - Abnormal; Notable for the following:       Result Value   Sodium 134 (*)    Glucose, Bld 297 (*)    Calcium 8.7 (*)    All other components within normal limits  CBC - Abnormal; Notable for the following:    WBC  12.4 (*)    All other components within normal limits  TROPONIN I  FIBRIN DERIVATIVES D-DIMER ([facility] ONLY)   ____________________________________________  EKG  ED ECG REPORT I, [place] Kayleana Waites, the attending physician, personally viewed and interpreted this ECG.   Date: 10/06/2015  EKG Time: 10:13 PM  Rate: 62  Rhythm: Normal sinus rhythm  Axis: Normal  Intervals: Normal  ST&T Change: None  ____________________________________________  RADIOLOGY I, [place] Mccutchen, personally viewed and evaluated these images (plain radiographs) as part of my medical decision making, as well as reviewing the written report by the radiologist.  Dg Chest 2 View  Result Date: 10/05/2015 CLINICAL DATA:  Chest pain and shortness of breath beginning at 1300 hours today, substernal chest pain without radiation, history acid reflux, diabetes mellitus EXAM: CHEST  2 VIEW COMPARISON:  06/15/2012 FINDINGS: Upper normal heart size. Mediastinal contours and pulmonary vascularity normal. Lungs clear. No pleural effusion or pneumothorax. Bones unremarkable. IMPRESSION: No acute abnormalities. Electronically Signed   By: Ulyses Southward M.D.   On: 10/05/2015 22:58    ____________________________________________   PROCEDURES  Procedure(s) performed:   Procedures   Critical Care performed: ____________________________________________   INITIAL IMPRESSION / ASSESSMENT AND PLAN / ED COURSE  Pertinent labs & imaging results that were available during my care of the patient were reviewed by me and considered in my medical decision making (see chart for details).  No clear etiology for the patient's chest pain identified EKG unremarkable troponin negative 2 d-dimer negative. Discussed patient's hyperglycemia with known history of diabetes to which she admits to being noncompliant with medication such will prescribe metformin twice a day with outpatient follow-up.  Clinical Course     ____________________________________________  FINAL CLINICAL IMPRESSION(S) / ED DIAGNOSES  Final diagnoses:  Nonspecific chest pain  Hyperglycemia     MEDICATIONS GIVEN DURING THIS VISIT:  Medications  sodium chloride 0.9 % bolus 1,000 mL (not administered)  gi cocktail (Maalox,Lidocaine,Donnatal) (30 mLs Oral Given 10/05/15 2332)  sodium chloride 0.9 % bolus 1,000 mL (1,000 mLs Intravenous New Bag/Given 10/05/15 2344)  diphenhydrAMINE (BENADRYL) injection 50 mg (50 mg Intravenous Given 10/05/15 2347)     NEW OUTPATIENT MEDICATIONS STARTED DURING THIS VISIT:  New Prescriptions   No medications on file      Note:  This document was prepared using Dragon voice recognition software and may include unintentional dictation errors.    Darci Current, MD 10/06/15 [phone]

## 2016-04-08 ENCOUNTER — Inpatient Hospital Stay
Admission: EM | Admit: 2016-04-08 | Discharge: 2016-04-09 | DRG: 918 | Disposition: A | Discharge status: Other Acute Inpatient Hospital | Payer: Self-pay | Attending: Pulmonary Disease | Admitting: Pulmonary Disease

## 2016-04-08 ENCOUNTER — Encounter: Payer: Self-pay | Admitting: Emergency Medicine

## 2016-04-08 DIAGNOSIS — R1084 Generalized abdominal pain: Secondary | ICD-10-CM | POA: Diagnosis present

## 2016-04-08 DIAGNOSIS — E872 Acidosis: Secondary | ICD-10-CM | POA: Diagnosis present

## 2016-04-08 DIAGNOSIS — T1491XA Suicide attempt, initial encounter: Secondary | ICD-10-CM

## 2016-04-08 DIAGNOSIS — D72829 Elevated white blood cell count, unspecified: Secondary | ICD-10-CM | POA: Diagnosis present

## 2016-04-08 DIAGNOSIS — Z90710 Acquired absence of both cervix and uterus: Secondary | ICD-10-CM

## 2016-04-08 DIAGNOSIS — F322 Major depressive disorder, single episode, severe without psychotic features: Secondary | ICD-10-CM | POA: Diagnosis present

## 2016-04-08 DIAGNOSIS — K219 Gastro-esophageal reflux disease without esophagitis: Secondary | ICD-10-CM | POA: Diagnosis present

## 2016-04-08 DIAGNOSIS — E118 Type 2 diabetes mellitus with unspecified complications: Secondary | ICD-10-CM

## 2016-04-08 DIAGNOSIS — E119 Type 2 diabetes mellitus without complications: Secondary | ICD-10-CM | POA: Diagnosis present

## 2016-04-08 DIAGNOSIS — Z7984 Long term (current) use of oral hypoglycemic drugs: Secondary | ICD-10-CM

## 2016-04-08 DIAGNOSIS — T383X2A Poisoning by insulin and oral hypoglycemic [antidiabetic] drugs, intentional self-harm, initial encounter: Principal | ICD-10-CM | POA: Diagnosis present

## 2016-04-08 DIAGNOSIS — T383X1A Poisoning by insulin and oral hypoglycemic [antidiabetic] drugs, accidental (unintentional), initial encounter: Secondary | ICD-10-CM

## 2016-04-08 DIAGNOSIS — Y92009 Unspecified place in unspecified non-institutional (private) residence as the place of occurrence of the external cause: Secondary | ICD-10-CM

## 2016-04-08 LAB — URINALYSIS, COMPLETE (UACMP) WITH MICROSCOPIC
BACTERIA UA: NONE SEEN
BILIRUBIN URINE: NEGATIVE
HGB URINE DIPSTICK: NEGATIVE
Ketones, ur: 20 mg/dL — AB
LEUKOCYTES UA: NEGATIVE
NITRITE: NEGATIVE
PROTEIN: NEGATIVE mg/dL
RBC / HPF: NONE SEEN RBC/hpf (ref 0–5)
SPECIFIC GRAVITY, URINE: 1.013 (ref 1.005–1.030)
Squamous Epithelial / LPF: NONE SEEN
pH: 5 (ref 5.0–8.0)

## 2016-04-08 LAB — LACTIC ACID, PLASMA
LACTIC ACID, VENOUS: 2 mmol/L — AB (ref 0.5–1.9)
Lactic Acid, Venous: 2.9 mmol/L (ref 0.5–1.9)
Lactic Acid, Venous: 4.8 mmol/L (ref 0.5–1.9)

## 2016-04-08 LAB — BLOOD GAS, VENOUS
ACID-BASE DEFICIT: 6.9 mmol/L — AB (ref 0.0–2.0)
BICARBONATE: 18.5 mmol/L — AB (ref 20.0–28.0)
O2 Saturation: 76.4 %
PATIENT TEMPERATURE: 37
PH VEN: 7.32 (ref 7.250–7.430)
PO2 VEN: 45 mmHg (ref 32.0–45.0)
pCO2, Ven: 36 mmHg — ABNORMAL LOW (ref 44.0–60.0)

## 2016-04-08 LAB — BASIC METABOLIC PANEL
Anion gap: 11 (ref 5–15)
BUN: 8 mg/dL (ref 6–20)
CHLORIDE: 106 mmol/L (ref 101–111)
CO2: 18 mmol/L — ABNORMAL LOW (ref 22–32)
CREATININE: 0.51 mg/dL (ref 0.44–1.00)
Calcium: 8.2 mg/dL — ABNORMAL LOW (ref 8.9–10.3)
Glucose, Bld: 126 mg/dL — ABNORMAL HIGH (ref 65–99)
POTASSIUM: 3.5 mmol/L (ref 3.5–5.1)
SODIUM: 135 mmol/L (ref 135–145)

## 2016-04-08 LAB — CBC WITH DIFFERENTIAL/PLATELET
BASOS PCT: 0 %
Basophils Absolute: 0 10*3/uL (ref 0–0.1)
Basophils Absolute: 0 10*3/uL (ref 0–0.1)
Basophils Relative: 0 %
EOS ABS: 0.3 10*3/uL (ref 0–0.7)
Eosinophils Absolute: 0.1 10*3/uL (ref 0–0.7)
Eosinophils Relative: 2 %
Eosinophils Relative: 1 %
HCT: 40.9 % (ref 35.0–47.0)
HEMATOCRIT: 36.5 % (ref 35.0–47.0)
Hemoglobin: 14.1 g/dL (ref 12.0–16.0)
Hemoglobin: 12.4 g/dL (ref 12.0–16.0)
LYMPHS ABS: 3.4 10*3/uL (ref 1.0–3.6)
LYMPHS ABS: 2.3 10*3/uL (ref 1.0–3.6)
LYMPHS PCT: 20 %
Lymphocytes Relative: 26 %
MCH: 30.6 pg (ref 26.0–34.0)
MCH: 30.8 pg (ref 26.0–34.0)
MCHC: 34.4 g/dL (ref 32.0–36.0)
MCHC: 34 g/dL (ref 32.0–36.0)
MCV: 89 fL (ref 80.0–100.0)
MCV: 90.5 fL (ref 80.0–100.0)
MONO ABS: 0.8 10*3/uL (ref 0.2–0.9)
MONO ABS: 0.6 10*3/uL (ref 0.2–0.9)
MONOS PCT: 6 %
MONOS PCT: 5 %
NEUTROS ABS: 8.4 10*3/uL — AB (ref 1.4–6.5)
NEUTROS ABS: 8.4 10*3/uL — AB (ref 1.4–6.5)
Neutrophils Relative %: 66 %
Neutrophils Relative %: 74 %
PLATELETS: 272 10*3/uL (ref 150–440)
Platelets: 233 10*3/uL (ref 150–440)
RBC: 4.6 MIL/uL (ref 3.80–5.20)
RBC: 4.03 MIL/uL (ref 3.80–5.20)
RDW: 13.3 % (ref 11.5–14.5)
RDW: 13.2 % (ref 11.5–14.5)
WBC: 12.9 10*3/uL — AB (ref 3.6–11.0)
WBC: 11.5 10*3/uL — ABNORMAL HIGH (ref 3.6–11.0)

## 2016-04-08 LAB — GLUCOSE, CAPILLARY
GLUCOSE-CAPILLARY: 351 mg/dL — AB (ref 65–99)
GLUCOSE-CAPILLARY: 293 mg/dL — AB (ref 65–99)
GLUCOSE-CAPILLARY: 300 mg/dL — AB (ref 65–99)
GLUCOSE-CAPILLARY: 109 mg/dL — AB (ref 65–99)
GLUCOSE-CAPILLARY: 236 mg/dL — AB (ref 65–99)
Glucose-Capillary: 261 mg/dL — ABNORMAL HIGH (ref 65–99)
Glucose-Capillary: 311 mg/dL — ABNORMAL HIGH (ref 65–99)
Glucose-Capillary: 287 mg/dL — ABNORMAL HIGH (ref 65–99)
Glucose-Capillary: 243 mg/dL — ABNORMAL HIGH (ref 65–99)
Glucose-Capillary: 199 mg/dL — ABNORMAL HIGH (ref 65–99)

## 2016-04-08 LAB — COMPREHENSIVE METABOLIC PANEL
ALBUMIN: 4.6 g/dL (ref 3.5–5.0)
ALBUMIN: 3.8 g/dL (ref 3.5–5.0)
ALBUMIN: 4.3 g/dL (ref 3.5–5.0)
ALK PHOS: 65 U/L (ref 38–126)
ALT: 39 U/L (ref 14–54)
ALT: 33 U/L (ref 14–54)
ALT: 40 U/L (ref 14–54)
ANION GAP: 11 (ref 5–15)
AST: 39 U/L (ref 15–41)
AST: 38 U/L (ref 15–41)
AST: 44 U/L — ABNORMAL HIGH (ref 15–41)
Alkaline Phosphatase: 57 U/L (ref 38–126)
Alkaline Phosphatase: 64 U/L (ref 38–126)
Anion gap: 10 (ref 5–15)
Anion gap: 12 (ref 5–15)
BILIRUBIN TOTAL: 0.5 mg/dL (ref 0.3–1.2)
BUN: 11 mg/dL (ref 6–20)
BUN: 10 mg/dL (ref 6–20)
BUN: 9 mg/dL (ref 6–20)
CHLORIDE: 108 mmol/L (ref 101–111)
CO2: 18 mmol/L — AB (ref 22–32)
CO2: 17 mmol/L — AB (ref 22–32)
CO2: 14 mmol/L — AB (ref 22–32)
Calcium: 9.7 mg/dL (ref 8.9–10.3)
Calcium: 8.2 mg/dL — ABNORMAL LOW (ref 8.9–10.3)
Calcium: 8.5 mg/dL — ABNORMAL LOW (ref 8.9–10.3)
Chloride: 108 mmol/L (ref 101–111)
Chloride: 110 mmol/L (ref 101–111)
Creatinine, Ser: 0.49 mg/dL (ref 0.44–1.00)
Creatinine, Ser: 0.62 mg/dL (ref 0.44–1.00)
Creatinine, Ser: 0.65 mg/dL (ref 0.44–1.00)
GFR calc Af Amer: >60 mL/min (ref >60)
GFR calc Af Amer: >60 mL/min (ref >60)
GFR calc non Af Amer: >60 mL/min (ref >60)
GFR calc non Af Amer: >60 mL/min (ref >60)
GFR calc non Af Amer: >60 mL/min (ref >60)
GLUCOSE: 166 mg/dL — AB (ref 65–99)
GLUCOSE: 250 mg/dL — AB (ref 65–99)
GLUCOSE: 314 mg/dL — AB (ref 65–99)
POTASSIUM: 3.9 mmol/L (ref 3.5–5.1)
POTASSIUM: 3.9 mmol/L (ref 3.5–5.1)
POTASSIUM: 3.8 mmol/L (ref 3.5–5.1)
SODIUM: 137 mmol/L (ref 135–145)
Sodium: 137 mmol/L (ref 135–145)
Sodium: 134 mmol/L — ABNORMAL LOW (ref 135–145)
TOTAL PROTEIN: 7.2 g/dL (ref 6.5–8.1)
Total Bilirubin: 0.7 mg/dL (ref 0.3–1.2)
Total Bilirubin: 0.5 mg/dL (ref 0.3–1.2)
Total Protein: 8.5 g/dL — ABNORMAL HIGH (ref 6.5–8.1)
Total Protein: 7.6 g/dL (ref 6.5–8.1)

## 2016-04-08 LAB — URINE DRUG SCREEN, QUALITATIVE (ARMC ONLY)
Amphetamines, Ur Screen: NOT DETECTED
BARBITURATES, UR SCREEN: NOT DETECTED
Benzodiazepine, Ur Scrn: NOT DETECTED
CANNABINOID 50 NG, UR ~~LOC~~: NOT DETECTED
COCAINE METABOLITE, UR ~~LOC~~: NOT DETECTED
MDMA (ECSTASY) UR SCREEN: NOT DETECTED
Methadone Scn, Ur: NOT DETECTED
Opiate, Ur Screen: POSITIVE — AB
PHENCYCLIDINE (PCP) UR S: NOT DETECTED
Tricyclic, Ur Screen: NOT DETECTED

## 2016-04-08 LAB — MAGNESIUM
MAGNESIUM: 1.7 mg/dL (ref 1.7–2.4)
Magnesium: 2.4 mg/dL (ref 1.7–2.4)

## 2016-04-08 LAB — ACETAMINOPHEN LEVEL: Acetaminophen (Tylenol), Serum: <10 ug/mL — ABNORMAL LOW (ref 10–30)

## 2016-04-08 LAB — SALICYLATE LEVEL: Salicylate Lvl: <7 mg/dL (ref 2.8–30.0)

## 2016-04-08 LAB — LIPASE, BLOOD: Lipase: 27 U/L (ref 11–51)

## 2016-04-08 LAB — PHOSPHORUS: Phosphorus: 2.9 mg/dL (ref 2.5–4.6)

## 2016-04-08 LAB — MRSA PCR SCREENING: MRSA by PCR: NEGATIVE

## 2016-04-08 LAB — ETHANOL: Alcohol, Ethyl (B): <5 mg/dL (ref <5)

## 2016-04-08 MED ORDER — FAMOTIDINE IN NACL 20-0.9 MG/50ML-% IV SOLN
20.0000 mg | Freq: Two times a day (BID) | INTRAVENOUS | Status: DC
  Administered 2016-04-08: 20 mg via INTRAVENOUS
  Filled 2016-04-08: qty 50

## 2016-04-08 MED ORDER — SODIUM CHLORIDE 0.9 % IV SOLN: 250.0000 mL | INTRAVENOUS | Status: DC | PRN

## 2016-04-08 MED ORDER — MORPHINE SULFATE (PF) 4 MG/ML IV SOLN
4.0000 mg | Freq: Once | INTRAVENOUS | Status: AC
  Administered 2016-04-08: 4 mg via INTRAVENOUS
  Filled 2016-04-08: qty 1

## 2016-04-08 MED ORDER — MORPHINE SULFATE (PF) 2 MG/ML IV SOLN: 1.0000 mg | INTRAVENOUS | Status: DC | PRN

## 2016-04-08 MED ORDER — PROMETHAZINE HCL 25 MG/ML IJ SOLN
12.5000 mg | Freq: Once | INTRAMUSCULAR | Status: AC
  Administered 2016-04-08: 12.5 mg via INTRAVENOUS
  Filled 2016-04-08: qty 1

## 2016-04-08 MED ORDER — ACETAMINOPHEN 325 MG PO TABS
650.0000 mg | ORAL_TABLET | ORAL | Status: DC | PRN
  Administered 2016-04-08 – 2016-04-09 (×2): 650 mg via ORAL
  Filled 2016-04-08 (×2): qty 2

## 2016-04-08 MED ORDER — ONDANSETRON HCL 4 MG/2ML IJ SOLN
4.0000 mg | Freq: Four times a day (QID) | INTRAMUSCULAR | Status: DC | PRN
  Administered 2016-04-09 (×2): 4 mg via INTRAVENOUS
  Filled 2016-04-08 (×2): qty 2

## 2016-04-08 MED ORDER — MORPHINE SULFATE (PF) 4 MG/ML IV SOLN
2.0000 mg | INTRAVENOUS | Status: DC | PRN
  Administered 2016-04-08: 2 mg via INTRAVENOUS
  Filled 2016-04-08: qty 1

## 2016-04-08 MED ORDER — MORPHINE SULFATE (PF) 4 MG/ML IV SOLN
1.0000 mg | INTRAVENOUS | Status: DC | PRN
  Administered 2016-04-08: 2 mg via INTRAVENOUS
  Filled 2016-04-08: qty 1

## 2016-04-08 MED ORDER — SODIUM CHLORIDE 0.9 % IV SOLN
INTRAVENOUS | Status: DC
  Administered 2016-04-08: 06:00:00 via INTRAVENOUS

## 2016-04-08 MED ORDER — ONDANSETRON HCL 4 MG/2ML IJ SOLN
4.0000 mg | Freq: Once | INTRAMUSCULAR | Status: AC
  Administered 2016-04-08: 4 mg via INTRAVENOUS
  Filled 2016-04-08: qty 2

## 2016-04-08 MED ORDER — MAGNESIUM SULFATE 2 GM/50ML IV SOLN
2.0000 g | Freq: Once | INTRAVENOUS | Status: AC
  Administered 2016-04-08: 2 g via INTRAVENOUS
  Filled 2016-04-08: qty 50

## 2016-04-08 MED ORDER — INSULIN ASPART 100 UNIT/ML ~~LOC~~ SOLN
0.0000 [IU] | SUBCUTANEOUS | Status: DC
  Administered 2016-04-08: 2 [IU] via SUBCUTANEOUS
  Administered 2016-04-08: 3 [IU] via SUBCUTANEOUS
  Administered 2016-04-09 (×2): 1 [IU] via SUBCUTANEOUS
  Filled 2016-04-08: qty 1
  Filled 2016-04-08: qty 2
  Filled 2016-04-08: qty 3
  Filled 2016-04-08: qty 1

## 2016-04-08 MED ORDER — SODIUM CHLORIDE 0.9 % IV BOLUS (SEPSIS)
1000.0000 mL | Freq: Once | INTRAVENOUS | Status: AC
  Administered 2016-04-08: 1000 mL via INTRAVENOUS

## 2016-04-08 MED ORDER — STERILE WATER FOR INJECTION IV SOLN
INTRAVENOUS | Status: DC
  Administered 2016-04-08: 16:00:00 via INTRAVENOUS
  Filled 2016-04-08 (×2): qty 850

## 2016-04-08 MED ORDER — ENOXAPARIN SODIUM 40 MG/0.4ML ~~LOC~~ SOLN
40.0000 mg | SUBCUTANEOUS | Status: DC
  Administered 2016-04-08: 40 mg via SUBCUTANEOUS
  Filled 2016-04-08: qty 0.4

## 2016-04-08 MED ORDER — ORAL CARE MOUTH RINSE: 15.0000 mL | Freq: Two times a day (BID) | OROMUCOSAL | Status: DC

## 2016-04-08 MED ORDER — CHARCOAL ACTIVATED PO LIQD
75.0000 g | Freq: Once | ORAL | Status: AC
  Administered 2016-04-08: 75 g via ORAL
  Filled 2016-04-08: qty 480

## 2016-04-08 NOTE — ED Provider Notes (Signed)
[facility] Emergency Department Provider Note  ____________________________________________   None    (approximate)  I have reviewed the triage vital signs and the nursing notes.   HISTORY  Chief Complaint Drug Overdose   HPI Sabrina Hanna is a 41 y.o. female with a history of diabetes on metformin who is presenting after an intentional overdose. Per EMS, the patient was found to be taking handfuls of her metformin and took at least 40, 500 mg tabs at about one hour prior to arrival. The patient says that she took these tabs and intent to kill herself. She has been having marital issues and reportedly, is having an extramarital affair. The pill count in the bottle of metformin now is 38 pills. The total amount was 160 but the initial prescription was written in November. The patient at this time is complaining of cramping and diffuse abdominal pain. Per EMS, she had several episodes of clear, mucous-like vomitus in route.   Past Medical History:  Diagnosis Date  . Diabetes mellitus without complication (HCC)     There are no active problems to display for this patient.   Past Surgical History:  Procedure Laterality Date  . ABDOMINAL HYSTERECTOMY    . LAPAROSCOPIC UNILATERAL SALPINGO OOPHERECTOMY Right     Prior to Admission medications   Medication Sig Start Date End Date Taking? Authorizing Provider  metFORMIN (GLUCOPHAGE) 500 MG tablet Take 500 mg by mouth 2 (two) times daily with a meal.   Yes Historical Provider, MD  nitrofurantoin, macrocrystal-monohydrate, (MACROBID) 100 MG capsule Take 1 capsule by mouth 2 (two) times daily. 04/02/16 04/08/16 Yes Historical Provider, MD    Allergies Penicillins  History reviewed. No pertinent family history.  Social History Social History  Substance Use Topics  . Smoking status: Never Smoker  . Smokeless tobacco: Never Used  . Alcohol use No    Review of Systems Constitutional: No  fever/chills Eyes: No visual changes. ENT: No sore throat. Cardiovascular: Denies chest pain. Respiratory: Denies shortness of breath. Gastrointestinal:  No diarrhea.  No constipation. Genitourinary: Negative for dysuria. Musculoskeletal: Negative for back pain. Skin: Negative for rash. Neurological: Negative for headaches, focal weakness or numbness.  10-point ROS otherwise negative.  ____________________________________________   PHYSICAL EXAM:  VITAL SIGNS: ED Triage Vitals  Enc Vitals Group     BP 04/08/16 0339 (!) 133/92     Pulse Rate 04/08/16 0339 97     Resp 04/08/16 0339 12     Temp 04/08/16 0339 98.6 F (37 C)     Temp Source 04/08/16 0339 Oral     SpO2 04/08/16 0339 99 %     Weight 04/08/16 0341 163 lb (73.9 kg)     Height 04/08/16 0341 5' (1.524 m)     Head Circumference --      Peak Flow --      Pain Score 04/08/16 0341 9     Pain Loc --      Pain Edu? --      Excl. in GC? --     Constitutional: Alert and oriented. Patient holding her abdomen and appears uncomfortable. Eyes: Conjunctivae are normal. PERRL. EOMI. Head: Atraumatic. Nose: No congestion/rhinnorhea. Mouth/Throat: Mucous membranes are moist.   Neck: No stridor.   Cardiovascular: Normal rate, regular rhythm. Grossly normal heart sounds.   Respiratory: Normal respiratory effort.  No retractions. Lungs CTAB. Gastrointestinal: Soft with mild and diffuse tenderness to palpation. No distention.  Musculoskeletal: No lower extremity tenderness nor edema.  No  joint effusions. Neurologic:  Normal speech and language. No gross focal neurologic deficits are appreciated.  Skin:  Skin is warm, dry and intact. No rash noted. Psychiatric: Positive for suicidal ideation.  ____________________________________________   LABS (all labs ordered are listed, but only abnormal results are displayed)  Labs Reviewed  COMPREHENSIVE METABOLIC PANEL - Abnormal; Notable for the following:       Result Value   CO2  18 (*)    Glucose, Bld 166 (*)    Total Protein 8.5 (*)    All other components within normal limits  ACETAMINOPHEN LEVEL - Abnormal; Notable for the following:    Acetaminophen (Tylenol), Serum <10 (*)    All other components within normal limits  LACTIC ACID, PLASMA - Abnormal; Notable for the following:    Lactic Acid, Venous 2.9 (*)    All other components within normal limits  BLOOD GAS, VENOUS - Abnormal; Notable for the following:    pCO2, Ven 36 (*)    Bicarbonate 18.5 (*)    Acid-base deficit 6.9 (*)    All other components within normal limits  LIPASE, BLOOD  SALICYLATE LEVEL  ETHANOL  CBC WITH DIFFERENTIAL/PLATELET  URINALYSIS, COMPLETE (UACMP) WITH MICROSCOPIC  URINE DRUG SCREEN, QUALITATIVE ([facility] ONLY)  LACTIC ACID, PLASMA  CBG MONITORING, ED  POC URINE PREG, ED  CBG MONITORING, ED  CBG MONITORING, ED  CBG MONITORING, ED  CBG MONITORING, ED   ____________________________________________  EKG  ED ECG REPORT I, Idamae Coccia,  Teena Irani, the attending physician, personally viewed and interpreted this ECG.   Date: 04/08/2016  EKG Time: 0336  Rate: 117  Rhythm: sinus tachycardia  Axis: normal  Intervals:none  ST&T Change: No ST segment elevation or depression. T-wave inversions in 1 as well as aVL.  EKG machine read as atrial fibrillation. However, there are easily visible P waves in V2 as well as V4 and V5. T-wave inversions in 1 as well as aVL. New from previous EKGs. ____________________________________________  RADIOLOGY   ____________________________________________   PROCEDURES  Procedure(s) performed:   Procedures  Critical Care performed:  CRITICAL CARE Performed by: Arelia Longest   Total critical care time: 35 minutes  Critical care time was exclusive of separately billable procedures and treating other patients.  Critical care was necessary to treat or prevent imminent or life-threatening deterioration.  Critical care was time  spent personally by me on the following activities: development of treatment plan with patient and/or surrogate as well as nursing, discussions with consultants, evaluation of patient's response to treatment, examination of patient, obtaining history from patient or surrogate, ordering and performing treatments and interventions, ordering and review of laboratory studies, ordering and review of radiographic studies, pulse oximetry and re-evaluation of patient's condition.   ____________________________________________   INITIAL IMPRESSION / ASSESSMENT AND PLAN / ED COURSE  Pertinent labs & imaging results that were available during my care of the patient were reviewed by me and considered in my medical decision making (see chart for details).  ----------------------------------------- 4:49 AM on 04/08/2016 -----------------------------------------  Patient with large overdose of metformin with intent to kill self. I also discussed case with the patient's husband was in the family waiting area who said that the patient has been having an extramarital affair for about the past year and a half. He is aware of the patient's overdose and witnessed overdose. I discussed with him as well as the patient's daughter was also in the room that the patient appears to be relatively stable right  now but due to the amount of medicine that was taken could very well worsened and for that reason she will need to be admitted to the ICU. I also discussed the case with Malachi BondsGloria at poison control who recommends repeat CMP as well as lactic acid every 2 hours. I discussed this with Dr. Delton CoombesByrum of the ICU service who accepts the patient for permission to ICU. The patient was placed under involuntary commitment. She is aware of the severity of her overdose and that she'll need to be admitted to the ICU at this time.      ____________________________________________   FINAL CLINICAL IMPRESSION(S) / ED DIAGNOSES  Metformin  overdose, intentional. Suicidal ideation.    NEW MEDICATIONS STARTED DURING THIS VISIT:  New Prescriptions   No medications on file     Note:  This document was prepared using Dragon voice recognition software and may include unintentional dictation errors.    Myrna Blazeravid Matthew Codi Folkerts, MD 04/08/16 [phone]

## 2016-04-08 NOTE — BH Assessment (Signed)
Clinician confirmed with EDP Dr. Pershing ProudSchaevitz that pt is to be admitted to medical floor.

## 2016-04-08 NOTE — ED Notes (Signed)
Sabrina StanleyLisa Hanna sitting with pt

## 2016-04-08 NOTE — Consult Note (Signed)
[facility] Consult   Reason for Consult:  Consult for 41 year old woman in the critical care unit after overdosing on metformin Referring Physician:  Alva Garnet Patient Identification: Sabrina Hanna MRN:  [id number] Principal Diagnosis: Severe major depression, single episode, without psychotic features [facility]) Diagnosis:   Patient Active Problem List   Diagnosis Date Noted  . Intentional metformin overdose (Sabrina Hanna) [T38.3X2A] 04/08/2016  . Suicidal ideation [R45.851] 04/08/2016  . Severe major depression, single episode, without psychotic features (Sabrina Hanna) [F32.2] 04/08/2016  . Diabetes mellitus without complication (Sabrina Hanna) [R51.8] 04/08/2016    Total Time spent with patient: 1 hour  Subjective:   Sabrina Hanna is a 41 y.o. female patient admitted with "I took a bunch of pills".  HPI:  Patient interviewed. Chart reviewed. Patient was interviewed with the help of a hospital certified [place] language interpreter. 41 year old woman came into the hospital after taking an overdose of metformin last night. She tells me that she was arguing with her husband. This argument was part of a long-standing issue they have been having for the last several months ever since she was unfaithful to him. He was bringing it up again and getting angry at her. She poured out her entire bottle of metformin pills into her hand and swallowed them pill. She tells me that she wanted to die at the time. Nevertheless she is denying being depressed now. Says that she sleeps okay and eats okay. Tries to minimize most symptoms. Currently denies any suicidal ideation. Has a superficial description of how everything is going to be fine in the future. Not currently receiving any kind of outpatient psychiatric treatment.  Social history: Patient works at [employer]. Lives with her husband and 2 daughters at home. She was unfaithful to her husband several months ago and he found out about it. Ever since they have  been arguing although she is trying to minimize the issue.  Medical history: Diabetes maintained normally on metformin. Denies any other ongoing medical problems.  Substance abuse history: Denies regular alcohol or drug abuse  Past Psychiatric History: Denies any past psychiatric treatment. Never been in a psychiatric hospital. Never been prescribed any psychiatric medicine. Never tried to kill her self before.  Risk to Self: Is patient at risk for suicide?: Yes Risk to Others:   Prior Inpatient Therapy:   Prior Outpatient Therapy:    Past Medical History:  Past Medical History:  Diagnosis Date  . Diabetes mellitus without complication [facility])     Past Surgical History:  Procedure Laterality Date  . ABDOMINAL HYSTERECTOMY    . LAPAROSCOPIC UNILATERAL SALPINGO OOPHERECTOMY Right    Family History: History reviewed. No pertinent family history. Family Psychiatric  History: Denies any family history of mental illness Social History:  History  Alcohol Use No     History  Drug Use No    Social History   Social History  . Marital status: Married    Spouse name: N/A  . Number of children: N/A  . Years of education: N/A   Social History Main Topics  . Smoking status: Never Smoker  . Smokeless tobacco: Never Used  . Alcohol use No  . Drug use: No  . Sexual activity: Yes   Other Topics Concern  . None   Social History Narrative  . None   Additional Social History:    Allergies:   Allergies  Allergen Reactions  . Penicillins Rash and Other (See Comments)    Has patient had a PCN reaction causing immediate rash,  facial/tongue/throat swelling, SOB or lightheadedness with hypotension: No Has patient had a PCN reaction causing severe rash involving mucus membranes or skin necrosis: No Has patient had a PCN reaction that required hospitalization No Has patient had a PCN reaction occurring within the last 10 years: No If all of the above answers are "NO", then may proceed  with Cephalosporin use.     Labs:  Results for orders placed or performed during the hospital encounter of 04/08/16 (from the past 48 hour(s))  CBC with Differential     Status: Abnormal   Collection Time: 04/08/16  3:44 AM  Result Value Ref Range   WBC 12.9 (H) 3.6 - 11.0 K/uL   RBC 4.60 3.80 - 5.20 MIL/uL   Hemoglobin 14.1 12.0 - 16.0 g/dL   HCT 32.3 34.0 - 71.1 %   MCV 89.0 80.0 - 100.0 fL   MCH 30.6 26.0 - 34.0 pg   MCHC 34.4 32.0 - 36.0 g/dL   RDW 38.8 10.6 - 71.9 %   Platelets 272 150 - 440 K/uL   Neutrophils Relative % 66 %   Lymphocytes Relative 26 %   Monocytes Relative 6 %   Eosinophils Relative 2 %   Basophils Relative 0 %   Neutro Abs 8.4 (H) 1.4 - 6.5 K/uL   Lymphs Abs 3.4 1.0 - 3.6 K/uL   Monocytes Absolute 0.8 0.2 - 0.9 K/uL   Eosinophils Absolute 0.3 0 - 0.7 K/uL   Basophils Absolute 0.0 0 - 0.1 K/uL   WBC Morphology ATYPICAL LYMPHOCYTES    Smear Review MORPHOLOGY UNREMARKABLE   Comprehensive metabolic panel     Status: Abnormal   Collection Time: 04/08/16  3:44 AM  Result Value Ref Range   Sodium 137 135 - 145 mmol/L   Potassium 3.9 3.5 - 5.1 mmol/L   Chloride 108 101 - 111 mmol/L   CO2 18 (L) 22 - 32 mmol/L   Glucose, Bld 166 (H) 65 - 99 mg/dL   BUN 11 6 - 20 mg/dL   Creatinine, Ser 3.42 0.44 - 1.00 mg/dL   Calcium 9.7 8.9 - 99.5 mg/dL   Total Protein 8.5 (H) 6.5 - 8.1 g/dL   Albumin 4.6 3.5 - 5.0 g/dL   AST 39 15 - 41 U/L   ALT 39 14 - 54 U/L   Alkaline Phosphatase 65 38 - 126 U/L   Total Bilirubin 0.7 0.3 - 1.2 mg/dL   GFR calc non Af Amer >60 >60 mL/min   GFR calc Af Amer >60 >60 mL/min    Comment: (NOTE) The eGFR has been calculated using the CKD EPI equation. This calculation has not been validated in all clinical situations. eGFR's persistently <60 mL/min signify possible Chronic Kidney Disease.    Anion gap 11 5 - 15  Lipase, blood     Status: None   Collection Time: 04/08/16  3:44 AM  Result Value Ref Range   Lipase 27 11 - 51 U/L   Acetaminophen level     Status: Abnormal   Collection Time: 04/08/16  3:44 AM  Result Value Ref Range   Acetaminophen (Tylenol), Serum <10 (L) 10 - 30 ug/mL    Comment:        THERAPEUTIC CONCENTRATIONS VARY SIGNIFICANTLY. A RANGE OF 10-30 ug/mL MAY BE AN EFFECTIVE CONCENTRATION FOR MANY PATIENTS. HOWEVER, SOME ARE BEST TREATED AT CONCENTRATIONS OUTSIDE THIS RANGE. ACETAMINOPHEN CONCENTRATIONS >150 ug/mL AT 4 HOURS AFTER INGESTION AND >50 ug/mL AT 12 HOURS AFTER INGESTION ARE OFTEN ASSOCIATED WITH  TOXIC REACTIONS.   Salicylate level     Status: None   Collection Time: 04/08/16  3:44 AM  Result Value Ref Range   Salicylate Lvl <6.1 2.8 - 30.0 mg/dL  Ethanol     Status: None   Collection Time: 04/08/16  3:44 AM  Result Value Ref Range   Alcohol, Ethyl (B) <5 <5 mg/dL    Comment:        LOWEST DETECTABLE LIMIT FOR SERUM ALCOHOL IS 5 mg/dL FOR MEDICAL PURPOSES ONLY   Lactic acid, plasma     Status: Abnormal   Collection Time: 04/08/16  3:45 AM  Result Value Ref Range   Lactic Acid, Venous 2.9 (HH) 0.5 - 1.9 mmol/L    Comment: CRITICAL RESULT CALLED TO, READ BACK BY AND VERIFIED WITH KUCH YUAL ON 04/08/16 AT 0429 BY TLB   Blood gas, venous     Status: Abnormal   Collection Time: 04/08/16  3:45 AM  Result Value Ref Range   pH, Ven 7.32 7.250 - 7.430   pCO2, Ven 36 (L) 44.0 - 60.0 mmHg   pO2, Ven 45.0 32.0 - 45.0 mmHg   Bicarbonate 18.5 (L) 20.0 - 28.0 mmol/L   Acid-base deficit 6.9 (H) 0.0 - 2.0 mmol/L   O2 Saturation 76.4 %   Patient temperature 37.0    Collection site VEIN    Sample type VENOUS   Magnesium     Status: None   Collection Time: 04/08/16  5:01 AM  Result Value Ref Range   Magnesium 1.7 1.7 - 2.4 mg/dL  Phosphorus     Status: None   Collection Time: 04/08/16  5:01 AM  Result Value Ref Range   Phosphorus 2.9 2.5 - 4.6 mg/dL  Comprehensive metabolic panel     Status: Abnormal   Collection Time: 04/08/16  5:02 AM  Result Value Ref Range   Sodium 137  135 - 145 mmol/L   Potassium 3.9 3.5 - 5.1 mmol/L   Chloride 110 101 - 111 mmol/L   CO2 17 (L) 22 - 32 mmol/L   Glucose, Bld 250 (H) 65 - 99 mg/dL   BUN 10 6 - 20 mg/dL   Creatinine, Ser 0.62 0.44 - 1.00 mg/dL   Calcium 8.2 (L) 8.9 - 10.3 mg/dL   Total Protein 7.2 6.5 - 8.1 g/dL   Albumin 3.8 3.5 - 5.0 g/dL   AST 38 15 - 41 U/L   ALT 33 14 - 54 U/L   Alkaline Phosphatase 57 38 - 126 U/L   Total Bilirubin 0.5 0.3 - 1.2 mg/dL   GFR calc non Af Amer >60 >60 mL/min   GFR calc Af Amer >60 >60 mL/min    Comment: (NOTE) The eGFR has been calculated using the CKD EPI equation. This calculation has not been validated in all clinical situations. eGFR's persistently <60 mL/min signify possible Chronic Kidney Disease.    Anion gap 10 5 - 15  CBC with Differential/Platelet     Status: Abnormal   Collection Time: 04/08/16  5:02 AM  Result Value Ref Range   WBC 11.5 (H) 3.6 - 11.0 K/uL   RBC 4.03 3.80 - 5.20 MIL/uL   Hemoglobin 12.4 12.0 - 16.0 g/dL   HCT 36.5 35.0 - 47.0 %   MCV 90.5 80.0 - 100.0 fL   MCH 30.8 26.0 - 34.0 pg   MCHC 34.0 32.0 - 36.0 g/dL   RDW 13.2 11.5 - 14.5 %   Platelets 233 150 - 440 K/uL  Neutrophils Relative % 74 %   Neutro Abs 8.4 (H) 1.4 - 6.5 K/uL   Lymphocytes Relative 20 %   Lymphs Abs 2.3 1.0 - 3.6 K/uL   Monocytes Relative 5 %   Monocytes Absolute 0.6 0.2 - 0.9 K/uL   Eosinophils Relative 1 %   Eosinophils Absolute 0.1 0 - 0.7 K/uL   Basophils Relative 0 %   Basophils Absolute 0.0 0 - 0.1 K/uL  Glucose, capillary     Status: Abnormal   Collection Time: 04/08/16  5:11 AM  Result Value Ref Range   Glucose-Capillary 261 (H) 65 - 99 mg/dL  Glucose, capillary     Status: Abnormal   Collection Time: 04/08/16  5:51 AM  Result Value Ref Range   Glucose-Capillary 311 (H) 65 - 99 mg/dL  Glucose, capillary     Status: Abnormal   Collection Time: 04/08/16  7:06 AM  Result Value Ref Range   Glucose-Capillary 351 (H) 65 - 99 mg/dL  Lactic acid, plasma      Status: Abnormal   Collection Time: 04/08/16  7:36 AM  Result Value Ref Range   Lactic Acid, Venous 4.8 (HH) 0.5 - 1.9 mmol/L    Comment: CRITICAL RESULT CALLED TO, READ BACK BY AND VERIFIED WITH BROOKE GREGORY 04/08/16 0825 KLW   Comprehensive metabolic panel     Status: Abnormal   Collection Time: 04/08/16  7:36 AM  Result Value Ref Range   Sodium 134 (L) 135 - 145 mmol/L   Potassium 3.8 3.5 - 5.1 mmol/L   Chloride 108 101 - 111 mmol/L   CO2 14 (L) 22 - 32 mmol/L   Glucose, Bld 314 (H) 65 - 99 mg/dL   BUN 9 6 - 20 mg/dL   Creatinine, Ser 0.65 0.44 - 1.00 mg/dL   Calcium 8.5 (L) 8.9 - 10.3 mg/dL   Total Protein 7.6 6.5 - 8.1 g/dL   Albumin 4.3 3.5 - 5.0 g/dL   AST 44 (H) 15 - 41 U/L   ALT 40 14 - 54 U/L   Alkaline Phosphatase 64 38 - 126 U/L   Total Bilirubin 0.5 0.3 - 1.2 mg/dL   GFR calc non Af Amer >60 >60 mL/min   GFR calc Af Amer >60 >60 mL/min    Comment: (NOTE) The eGFR has been calculated using the CKD EPI equation. This calculation has not been validated in all clinical situations. eGFR's persistently <60 mL/min signify possible Chronic Kidney Disease.    Anion gap 12 5 - 15  Magnesium     Status: None   Collection Time: 04/08/16  7:36 AM  Result Value Ref Range   Magnesium 2.4 1.7 - 2.4 mg/dL  Glucose, capillary     Status: Abnormal   Collection Time: 04/08/16  8:02 AM  Result Value Ref Range   Glucose-Capillary 293 (H) 65 - 99 mg/dL   Comment 1 Notify RN   MRSA PCR Screening     Status: None   Collection Time: 04/08/16  8:56 AM  Result Value Ref Range   MRSA by PCR NEGATIVE NEGATIVE    Comment:        The GeneXpert MRSA Assay (FDA approved for NASAL specimens only), is one component of a comprehensive MRSA colonization surveillance program. It is not intended to diagnose MRSA infection nor to guide or monitor treatment for MRSA infections.   Urinalysis, Complete w Microscopic     Status: Abnormal   Collection Time: 04/08/16  9:00 AM  Result Value  Ref  Range   Color, Urine STRAW (A) YELLOW   APPearance CLEAR (A) CLEAR   Specific Gravity, Urine 1.013 1.005 - 1.030   pH 5.0 5.0 - 8.0   Glucose, UA >=500 (A) NEGATIVE mg/dL   Hgb urine dipstick NEGATIVE NEGATIVE   Bilirubin Urine NEGATIVE NEGATIVE   Ketones, ur 20 (A) NEGATIVE mg/dL   Protein, ur NEGATIVE NEGATIVE mg/dL   Nitrite NEGATIVE NEGATIVE   Leukocytes, UA NEGATIVE NEGATIVE   RBC / HPF NONE SEEN 0 - 5 RBC/hpf   WBC, UA 0-5 0 - 5 WBC/hpf   Bacteria, UA NONE SEEN NONE SEEN   Squamous Epithelial / LPF NONE SEEN NONE SEEN   Mucous PRESENT   Urine Drug Screen, Qualitative ([facility] only)     Status: Abnormal   Collection Time: 04/08/16  9:00 AM  Result Value Ref Range   Tricyclic, Ur Screen NONE DETECTED NONE DETECTED   Amphetamines, Ur Screen NONE DETECTED NONE DETECTED   MDMA (Ecstasy)Ur Screen NONE DETECTED NONE DETECTED   Cocaine Metabolite,Ur [place] NONE DETECTED NONE DETECTED   Opiate, Ur Screen POSITIVE (A) NONE DETECTED   Phencyclidine (PCP) Ur S NONE DETECTED NONE DETECTED   Cannabinoid 50 Ng, Ur [place] NONE DETECTED NONE DETECTED   Barbiturates, Ur Screen NONE DETECTED NONE DETECTED   Benzodiazepine, Ur Scrn NONE DETECTED NONE DETECTED   Methadone Scn, Ur NONE DETECTED NONE DETECTED    Comment: (NOTE) 097  Tricyclics, urine               Cutoff 1000 ng/mL 200  Amphetamines, urine             Cutoff 1000 ng/mL 300  MDMA (Ecstasy), urine           Cutoff 500 ng/mL 400  Cocaine Metabolite, urine       Cutoff 300 ng/mL 500  Opiate, urine                   Cutoff 300 ng/mL 600  Phencyclidine (PCP), urine      Cutoff 25 ng/mL 700  Cannabinoid, urine              Cutoff 50 ng/mL 800  Barbiturates, urine             Cutoff 200 ng/mL 900  Benzodiazepine, urine           Cutoff 200 ng/mL 1000 Methadone, urine                Cutoff 300 ng/mL 1100 1200 The urine drug screen provides only a preliminary, unconfirmed 1300 analytical test result and should not be used for  non-medical 1400 purposes. Clinical consideration and professional judgment should 1500 be applied to any positive drug screen result due to possible 1600 interfering substances. A more specific alternate chemical method 1700 must be used in order to obtain a confirmed analytical result.  1800 Gas chromato graphy / mass spectrometry (GC/MS) is the preferred 1900 confirmatory method.   Glucose, capillary     Status: Abnormal   Collection Time: 04/08/16  9:00 AM  Result Value Ref Range   Glucose-Capillary 300 (H) 65 - 99 mg/dL   Comment 1 Notify RN   Glucose, capillary     Status: Abnormal   Collection Time: 04/08/16 10:02 AM  Result Value Ref Range   Glucose-Capillary 287 (H) 65 - 99 mg/dL   Comment 1 Notify RN   Glucose, capillary     Status: Abnormal   Collection Time: 04/08/16  11:01 AM  Result Value Ref Range   Glucose-Capillary 243 (H) 65 - 99 mg/dL   Comment 1 Notify RN   Glucose, capillary     Status: Abnormal   Collection Time: 04/08/16 12:20 PM  Result Value Ref Range   Glucose-Capillary 199 (H) 65 - 99 mg/dL  Lactic acid, plasma     Status: Abnormal   Collection Time: 04/08/16  3:50 PM  Result Value Ref Range   Lactic Acid, Venous 2.0 (HH) 0.5 - 1.9 mmol/L    Comment: CRITICAL RESULT CALLED TO, READ BACK BY AND VERIFIED WITH BROOKE GREGORY AT 1654 ON 04/08/2016 JLJ   Basic metabolic panel     Status: Abnormal   Collection Time: 04/08/16  3:50 PM  Result Value Ref Range   Sodium 135 135 - 145 mmol/L   Potassium 3.5 3.5 - 5.1 mmol/L   Chloride 106 101 - 111 mmol/L   CO2 18 (L) 22 - 32 mmol/L   Glucose, Bld 126 (H) 65 - 99 mg/dL   BUN 8 6 - 20 mg/dL   Creatinine, Ser 0.51 0.44 - 1.00 mg/dL   Calcium 8.2 (L) 8.9 - 10.3 mg/dL   GFR calc non Af Amer >60 >60 mL/min   GFR calc Af Amer >60 >60 mL/min    Comment: (NOTE) The eGFR has been calculated using the CKD EPI equation. This calculation has not been validated in all clinical situations. eGFR's persistently <60  mL/min signify possible Chronic Kidney Disease.    Anion gap 11 5 - 15  Glucose, capillary     Status: Abnormal   Collection Time: 04/08/16  4:15 PM  Result Value Ref Range   Glucose-Capillary 109 (H) 65 - 99 mg/dL    Current Facility-Administered Medications  Medication Dose Route Frequency Provider Last Rate Last Dose  . 0.9 %  sodium chloride infusion  250 mL Intravenous PRN Awilda Bill, NP      . acetaminophen (TYLENOL) tablet 650 mg  650 mg Oral Q4H PRN Awilda Bill, NP      . insulin aspart (novoLOG) injection 0-9 Units  0-9 Units Subcutaneous Q4H Wilhelmina Mcardle, MD   2 Units at 04/08/16 1535  . morphine 4 MG/ML injection 2 mg  2 mg Intravenous Q3H PRN Wilhelmina Mcardle, MD      . ondansetron [facility]) injection 4 mg  4 mg Intravenous Q6H PRN Awilda Bill, NP      . sodium bicarbonate 150 mEq in sterile water 1,000 mL infusion   Intravenous Continuous Wilhelmina Mcardle, MD 75 mL/hr at 04/08/16 1535      Musculoskeletal: Strength & Muscle Tone: within normal limits Gait & Station: unable to stand Patient leans: N/A  Psychiatric Specialty Exam: Physical Exam  Nursing note and vitals reviewed. Constitutional: She appears well-developed and well-nourished.  HENT:  Head: Normocephalic and atraumatic.  Eyes: Conjunctivae are normal. Pupils are equal, round, and reactive to light.  Neck: Normal range of motion.  Cardiovascular: Regular rhythm and normal heart sounds.   Respiratory: Effort normal.  GI: Soft.  Musculoskeletal: Normal range of motion.  Neurological: She is alert.  Skin: Skin is warm and dry.  Psychiatric: Her affect is blunt. Her speech is delayed. She is slowed. She expresses impulsivity. She expresses no suicidal ideation. She exhibits abnormal recent memory.    Review of Systems  Constitutional: Negative.   HENT: Negative.   Eyes: Negative.   Respiratory: Negative.   Cardiovascular: Negative.   Gastrointestinal: Negative.  Musculoskeletal:  Negative.   Skin: Negative.   Neurological: Negative.   Psychiatric/Behavioral: Positive for depression and suicidal ideas. Negative for hallucinations, memory loss and substance abuse. The patient is not nervous/anxious and does not have insomnia.     Blood pressure 107/78, pulse 84, temperature 99.2 F (37.3 C), temperature source Oral, resp. rate (!) 24, height 5' (1.524 m), weight 75.1 kg (165 lb 9.1 oz), SpO2 97 %.Body mass index is 32.33 kg/m.  General Appearance: Casual  Eye Contact:  Fair  Speech:  Slow  Volume:  Decreased  Mood:  Dysphoric  Affect:  Congruent  Thought Process:  Goal Directed  Orientation:  Full (Time, Place, and Person)  Thought Content:  Illogical  Suicidal Thoughts:  No  Homicidal Thoughts:  No  Memory:  Immediate;   Good Recent;   Fair Remote;   Fair  Judgement:  Impaired  Insight:  Shallow  Psychomotor Activity:  Decreased  Concentration:  Concentration: Poor  Recall:  Poor  Fund of Knowledge:  Fair  Language:  Fair  Akathisia:  No  Handed:  Right  AIMS (if indicated):     Assets:  Desire for Improvement Housing Physical Health Resilience Social Support  ADL's:  Intact  Cognition:  WNL  Sleep:        Treatment Plan Summary: Daily contact with patient to assess and evaluate symptoms and progress in treatment and Plan 41 year old woman who made a serious suicide attempt although was impulsive. She is trying to minimize behavior now. Affect still blunted. Serious social problems at home. Currently still in the critical care unit but stabilizing and will probably be ready for discharge within the next day. Patient would do well to be transferred to the psychiatric unit. Continue IVC. Defer starting any psychiatric medicine for now.  Disposition: Recommend psychiatric Inpatient admission when medically cleared. Supportive therapy provided about ongoing stressors.  Alethia Berthold, MD 04/08/2016 7:33 PM

## 2016-04-08 NOTE — ED Notes (Addendum)
Blood glucose upon arrival is 174 @ 0330

## 2016-04-08 NOTE — ED Triage Notes (Addendum)
Pt arrived via [facility] to be evaluated for attempted SI, reported taking 40 tabs of 500mg  metformin around 0230 tonight. Pt verbalizes SI attempt. Reports nausea/vomiting. Pt states she is depressed because she has arguing with her spouse. She denies physical altercation.

## 2016-04-08 NOTE — Progress Notes (Signed)
Update given to Poison Control on the phone as patient's nurse was at lunch. Poison Control asked about patient's last lab values, EKG, IV fluids, and mental status. They confirmed that we are a facility that could perform emergent dialysis if patient needed it. Poison Control staff member stated she would be speaking with her toxicologist about potential recommendations and would be calling back.

## 2016-04-08 NOTE — H&P (Signed)
PULMONARY / CRITICAL CARE MEDICINE   Name: Sabrina Hanna MRN: [id number] DOB: Jul 12, 1975    ADMISSION DATE:  04/08/2016 CONSULTATION DATE:  04/08/2016  REFERRING MD: Dr. Pershing ProudSchaevitz  CHIEF COMPLAINT:  Intentional Metformin Overdose  HISTORY OF PRESENT ILLNESS:   This is a 41 yo female with a PMH of Type II Diabetes Mellitus, Fatty Liver, Constipation, GERD, and Abdominal Pain.  She presented to [facility] ER 02/13 following an intentional metformin overdose.  Per ER notes EMS found the pt taking handfuls of her metformin, she took at least 40 tabs of 500 mg metformin 1 hour prior to EMS arrival.  According to the pt she had an argument with her husband that started on 02/12, and as a result she attempted to kill herself by taking the metformin.  Per ER notes the pts husband reported she has been having an extramarital affair for a year and a half, which prompted the argument. Upon pts arrival to the ER there were 38 pills in the metformin bottle. The total amount in the bottle was 160, however the initial prescription was written in November.  The ER physician ordered activated charcoal per Poison Control recommendations, however the pt could not tolerate the medication due to vomiting after administration.  The pt is currently complaining of severe abdominal pain with nausea and vomiting.  Per EMS, the pt had several episodes of clear, mucous like vomitus en route to the ER. She is currently involuntarily committed, psych consult pending. PCCM contacted to [facility] for frequent monitoring, initial laboratory values revealed lactic acidosis.    PAST MEDICAL HISTORY :  She  has a past medical history of Diabetes mellitus without complication (HCC).  PAST SURGICAL HISTORY: She  has a past surgical history that includes Abdominal hysterectomy and Laparoscopic unilateral salpingo oophorectomy (Right).  Allergies  Allergen Reactions  . Penicillins Rash and Other (See Comments)     Has patient had a PCN reaction causing immediate rash, facial/tongue/throat swelling, SOB or lightheadedness with hypotension: No Has patient had a PCN reaction causing severe rash involving mucus membranes or skin necrosis: No Has patient had a PCN reaction that required hospitalization No Has patient had a PCN reaction occurring within the last 10 years: No If all of the above answers are "NO", then may proceed with Cephalosporin use.     No current facility-administered medications on file prior to encounter.    No current outpatient prescriptions on file prior to encounter.    FAMILY HISTORY:  Her has no family status information on file.    SOCIAL HISTORY: She  reports that she has never smoked. She has never used smokeless tobacco. She reports that she does not drink alcohol.  REVIEW OF SYSTEMS:  Positives in BOLD  Gen: Denies fever, chills, weight change, fatigue, night sweats HEENT: Denies blurred vision, double vision, hearing loss, tinnitus, sinus congestion, rhinorrhea, sore throat, neck stiffness, dysphagia PULM: Denies shortness of breath, cough, sputum production, hemoptysis, wheezing CV: Denies chest pain, edema, orthopnea, paroxysmal nocturnal dyspnea, palpitations GI: abdominal pain, nausea, vomiting, diarrhea, hematochezia, melena, constipation, change in bowel habits GU: Denies dysuria, hematuria, polyuria, oliguria, urethral discharge Endocrine: Denies hot or cold intolerance, polyuria, polyphagia or appetite change Derm: Denies rash, dry skin, scaling or peeling skin change Heme: Denies easy bruising, bleeding, bleeding gums Neuro: Denies headache, numbness, weakness, slurred speech, loss of memory or consciousness  SUBJECTIVE:  Pt currently c/o severe abdominal pain and nausea as well vomiting no other complaints at  this time.  She states currently she does not have any thoughts of harming herself or others.  VITAL SIGNS: BP (!) 136/93   Pulse 92   Temp  98.6 F (37 C) (Oral)   Resp 17   Ht 5' (1.524 m)   Wt 73.9 kg (163 lb)   SpO2 96%   BMI 31.83 kg/m   HEMODYNAMICS:    VENTILATOR SETTINGS:    INTAKE / OUTPUT: No intake/output data recorded.  PHYSICAL EXAMINATION: General: well developed, well nourished Hispanic female currently tearful  Neuro: alert and oriented, follows commands, PERRLA HEENT: supple, no JVD Cardiovascular: NSR, rrr, no M/R/G Lungs: clear throughout, even, non labored Abdomen: hyperactive BS, soft, obese, non distended, tender in all quadrants Musculoskeletal: normal bulk and tone Skin: intact no rashes or lesions  LABS:  BMET  Recent Labs Lab 04/08/16 0344  NA 137  K 3.9  CL 108  CO2 18*  BUN 11  CREATININE 0.49  GLUCOSE 166*    Electrolytes  Recent Labs Lab 04/08/16 0344  CALCIUM 9.7    CBC  Recent Labs Lab 04/08/16 0344  WBC 12.9*  HGB 14.1  HCT 40.9  PLT 272    Coag's No results for input(s): APTT, INR in the last 168 hours.  Sepsis Markers  Recent Labs Lab 04/08/16 0345  LATICACIDVEN 2.9*    ABG No results for input(s): PHART, PCO2ART, PO2ART in the last 168 hours.  Liver Enzymes  Recent Labs Lab 04/08/16 0344  AST 39  ALT 39  ALKPHOS 65  BILITOT 0.7  ALBUMIN 4.6    Cardiac Enzymes No results for input(s): TROPONINI, PROBNP in the last 168 hours.  Glucose No results for input(s): GLUCAP in the last 168 hours.  Imaging No results found.  STUDIES:  None   CULTURES: None   ANTIBIOTICS: None  SIGNIFICANT EVENTS: 02/13-Pt admitted to [facility] Unit due to intentional metformin overdose PCCM contacted to admit pt   LINES/TUBES: PIV's 02/13>>  ASSESSMENT / PLAN:  PULMONARY A: No acute issues P:   Supplemental O2 to maintain O2 sats >92% Prn CXR and ABG's  CARDIOVASCULAR A:  No acute issues  P:  Continuous telemetry monitoring   RENAL A:   Lactic acidosis  P:   Trend Lactic acids q2hrs x3  CMP's q2hrs x3 Monitor  UOP Replace electrolytes as indicated   GASTROINTESTINAL A:   Abdominal Pain Nausea and Vomiting Hx: GERD, Fatty Liver, and Abdominal Pain  P:   Pepcid for GERD  Keep NPO for now Prn Zofran    HEMATOLOGIC A:   No acute issues  P:  Trend CBC Lovenox for VTE prophylaxis  Monitor for s/sx of bleeding   INFECTIOUS A:   Mild leukocytosis  P:   Trend WBC's and monitor fever curve   ENDOCRINE A:   Intentional metformin overdose Hx: Type II DM P:   CBG's q1hrs Will start D5W gtt if pt develops hypoglycemia   NEUROLOGIC A:   Intentional Metformin Overdose Suicidal Ideation P:   Sitter at bedside for suicide precautions Pt involuntarily committed  Psychiatry consulted appreciate input Prn Morphine for pain management   FAMILY  - Updates: No family at bedside to update.  Discussed plan of care with pt and all questions answered utilizing a Spanish interpreter 04/08/16.  Sonda Rumble, AGNP  Pulmonary/Critical Care Pager [phone] (please enter 7 digits) PCCM Consult Pager [phone] (please enter 7 digits)  PCCM ATTENDING ATTESTATION:  I have evaluated patient with the APP [place], reviewed  database in its entirety and discussed care plan in detail. In addition, this patient was discussed on multidisciplinary rounds.   Important exam findings: No distress Chest clear Reg, no M NABS No edema  Major problems addressed by PCCM team: Intentional metformin OD Lactic acidosis DM 2   PLAN/REC: As above Discussed with poison control Change IVFs - HCO3 infusion Recheck BMET and lactate later this afternoon Continue to monitor in ICU through today   Billy Fischer, MD PCCM service Mobile [phone] Pager [phone]

## 2016-04-08 NOTE — ED Notes (Signed)
Poison control called by Dr. Ernestina PennaSchavetz, spoke with Jacki ConesLaurie, recommends to observe pt and repeat chemistry every 2 hours until condition improves.

## 2016-04-08 NOTE — Progress Notes (Signed)
Pt has remained alert and oriented with no c/o pain. NSR on cardiac monitor. Pt placed on [facility]LNC for tachypnea in the upper 20s-30s. earlier in the shift. RR are even and unlabored. SpO2 >90%. Pt is with a sitter at bedside for suicide precautions. Pt speaks little english-family has been at bedside periodically throughout the day.  Lactic Acid has remained elevated-peak of 4.8, although trending down at 2.0 now. Dr Ardyth Manam was requested by poison control to call for concerns related to acidosis. Labs have been reordered and bicarb gtt started per poison control recommendations. Psych is currently at bedside with with family and Spanish interpreter.

## 2016-04-09 ENCOUNTER — Inpatient Hospital Stay
Admission: EM | Admit: 2016-04-09 | Discharge: 2016-04-11 | DRG: 882 | Disposition: A | Discharge status: Home / Self Care | Payer: No Typology Code available for payment source | Source: Intra-hospital | Attending: Psychiatry | Admitting: Psychiatry

## 2016-04-09 DIAGNOSIS — F4325 Adjustment disorder with mixed disturbance of emotions and conduct: Principal | ICD-10-CM | POA: Diagnosis present

## 2016-04-09 DIAGNOSIS — T383X2D Poisoning by insulin and oral hypoglycemic [antidiabetic] drugs, intentional self-harm, subsequent encounter: Secondary | ICD-10-CM

## 2016-04-09 DIAGNOSIS — Z811 Family history of alcohol abuse and dependence: Secondary | ICD-10-CM

## 2016-04-09 DIAGNOSIS — G4700 Insomnia, unspecified: Secondary | ICD-10-CM | POA: Diagnosis present

## 2016-04-09 DIAGNOSIS — Z630 Problems in relationship with spouse or partner: Secondary | ICD-10-CM | POA: Diagnosis not present

## 2016-04-09 DIAGNOSIS — E119 Type 2 diabetes mellitus without complications: Secondary | ICD-10-CM | POA: Diagnosis present

## 2016-04-09 DIAGNOSIS — Z7984 Long term (current) use of oral hypoglycemic drugs: Secondary | ICD-10-CM

## 2016-04-09 DIAGNOSIS — Z90710 Acquired absence of both cervix and uterus: Secondary | ICD-10-CM

## 2016-04-09 DIAGNOSIS — T383X2A Poisoning by insulin and oral hypoglycemic [antidiabetic] drugs, intentional self-harm, initial encounter: Secondary | ICD-10-CM | POA: Diagnosis present

## 2016-04-09 DIAGNOSIS — R45851 Suicidal ideations: Secondary | ICD-10-CM

## 2016-04-09 LAB — CBC WITH DIFFERENTIAL/PLATELET
Basophils Absolute: 0.1 10*3/uL (ref 0–0.1)
Basophils Relative: 2 %
EOS ABS: 0.2 10*3/uL (ref 0–0.7)
Eosinophils Relative: 2 %
HCT: 34.6 % — ABNORMAL LOW (ref 35.0–47.0)
Hemoglobin: 11.9 g/dL — ABNORMAL LOW (ref 12.0–16.0)
LYMPHS ABS: 2.4 10*3/uL (ref 1.0–3.6)
Lymphocytes Relative: 25 %
MCH: 30.6 pg (ref 26.0–34.0)
MCHC: 34.4 g/dL (ref 32.0–36.0)
MCV: 89 fL (ref 80.0–100.0)
MONOS PCT: 4 %
Monocytes Absolute: 0.4 10*3/uL (ref 0.2–0.9)
Neutro Abs: 6.4 10*3/uL (ref 1.4–6.5)
Neutrophils Relative %: 67 %
PLATELETS: 223 10*3/uL (ref 150–440)
RBC: 3.89 MIL/uL (ref 3.80–5.20)
RDW: 12.8 % (ref 11.5–14.5)
WBC: 9.6 10*3/uL (ref 3.6–11.0)

## 2016-04-09 LAB — COMPREHENSIVE METABOLIC PANEL
ALT: 34 U/L (ref 14–54)
ANION GAP: 7 (ref 5–15)
AST: 35 U/L (ref 15–41)
Albumin: 3.6 g/dL (ref 3.5–5.0)
Alkaline Phosphatase: 49 U/L (ref 38–126)
BUN: 9 mg/dL (ref 6–20)
CHLORIDE: 102 mmol/L (ref 101–111)
CO2: 27 mmol/L (ref 22–32)
Calcium: 8.2 mg/dL — ABNORMAL LOW (ref 8.9–10.3)
Creatinine, Ser: 0.43 mg/dL — ABNORMAL LOW (ref 0.44–1.00)
GFR calc non Af Amer: >60 mL/min (ref >60)
Glucose, Bld: 121 mg/dL — ABNORMAL HIGH (ref 65–99)
POTASSIUM: 3.1 mmol/L — AB (ref 3.5–5.1)
SODIUM: 136 mmol/L (ref 135–145)
Total Bilirubin: 0.7 mg/dL (ref 0.3–1.2)
Total Protein: 6.7 g/dL (ref 6.5–8.1)

## 2016-04-09 LAB — GLUCOSE, CAPILLARY
GLUCOSE-CAPILLARY: 127 mg/dL — AB (ref 65–99)
GLUCOSE-CAPILLARY: 137 mg/dL — AB (ref 65–99)
Glucose-Capillary: 172 mg/dL — ABNORMAL HIGH (ref 65–99)
Glucose-Capillary: 120 mg/dL — ABNORMAL HIGH (ref 65–99)
Glucose-Capillary: 196 mg/dL — ABNORMAL HIGH (ref 65–99)

## 2016-04-09 MED ORDER — INSULIN ASPART 100 UNIT/ML ~~LOC~~ SOLN: 0.0000 [IU] | Freq: Every day | SUBCUTANEOUS | Status: DC

## 2016-04-09 MED ORDER — TRAZODONE HCL 100 MG PO TABS: 100.0000 mg | ORAL_TABLET | Freq: Every evening | ORAL | Status: DC | PRN

## 2016-04-09 MED ORDER — ACETAMINOPHEN 325 MG PO TABS: 650.0000 mg | ORAL_TABLET | Freq: Four times a day (QID) | ORAL | Status: DC | PRN

## 2016-04-09 MED ORDER — METFORMIN HCL 500 MG PO TABS
500.0000 mg | ORAL_TABLET | Freq: Two times a day (BID) | ORAL | Status: DC
  Administered 2016-04-09 – 2016-04-11 (×4): 500 mg via ORAL
  Filled 2016-04-09 (×4): qty 1

## 2016-04-09 MED ORDER — MAGNESIUM HYDROXIDE 400 MG/5ML PO SUSP: 30.0000 mL | Freq: Every day | ORAL | Status: DC | PRN

## 2016-04-09 MED ORDER — HYDROXYZINE HCL 25 MG PO TABS: 25.0000 mg | ORAL_TABLET | Freq: Three times a day (TID) | ORAL | Status: DC | PRN

## 2016-04-09 MED ORDER — ALUM & MAG HYDROXIDE-SIMETH 200-200-20 MG/5ML PO SUSP: 30.0000 mL | ORAL | Status: DC | PRN

## 2016-04-09 MED ORDER — INSULIN ASPART 100 UNIT/ML ~~LOC~~ SOLN
0.0000 [IU] | Freq: Three times a day (TID) | SUBCUTANEOUS | Status: DC
  Administered 2016-04-09: 3 [IU] via SUBCUTANEOUS
  Filled 2016-04-09: qty 3

## 2016-04-09 MED ORDER — ONDANSETRON HCL 4 MG PO TABS: 4.0000 mg | ORAL_TABLET | Freq: Three times a day (TID) | ORAL | 0 refills | Status: DC | PRN

## 2016-04-09 NOTE — Progress Notes (Signed)
Patient presents with an anxious  affect and slightly guarded behavior during admission interview and assessment. Interpreter used because pt speaks minimal english.VS monitored and recorded. Skin check performed.Contraband was not found. Patient was oriented to unit and schedule. Pt denies SI/HI/AVH at this time. PO fluids provided. Safety maintained.

## 2016-04-09 NOTE — Discharge Summary (Signed)
Physician Discharge Summary  Patient ID: Sabrina Hanna MRN: [id number] DOB/AGE: Aug 09, 1975 41 y.o.  Admit date: 04/08/2016 Discharge date: 04/09/2016    Discharge Diagnoses:         Intentional Metformin Overdose        Severe major depression         Lactic Acidosis-much improved         Suicidal Ideation       Type II Diabetes Mellitus         Nausea            Vomiting-resolved          Abdominal Pain        GERD        Mildly Elevated Leukocytosis-resolved                                                                       DISCHARGE PLAN BY DIAGNOSIS    Type II Diabetes Mellitus Plan: Continue Metformin 500 mg twice a day  Pt aware of the s/sx of hypoglycemia and hyperglycemia.  She knows to notify her PCP or go to the ER if she develops s/sx  Severe major depression Suicidal ideation Intentional metformin overdose Plan: Psychiatric consulted and recommendation for pt to be discharged to Psychiatric Unit for additional management  GERD Abdominal Pain Plan: Instructed pt to take over the counter Pepcid 20 mg twice a day Instructed to avoid spicy foods and acidic foods  Nausea Plan: Will order prn po zofran               DISCHARGE SUMMARY   Sabrina Hanna is a 41 y.o. y/o female with a PMH of of Type II Diabetes Mellitus, Fatty Liver, Constipation, GERD, and Abdominal Pain.  She presented to [facility] ER 02/13 following an intentional metformin overdose.  Per ER notes EMS found the pt taking handfuls of her metformin, she took at least 40 tabs of 500 mg metformin 1 hour prior to EMS arrival.  According to the pt she had an argument with her husband that started on 02/12, and as a result she attempted to kill herself by taking the metformin.  Per ER notes the pts husband reported she had been having an extramarital affair for a year and a half, which prompted the argument. Upon pts arrival to the ER there were 38 pills in the metformin bottle. The total amount  in the bottle was 160, however the initial prescription was written in November.  The ER physician ordered activated charcoal per Poison Control recommendations, however the pt could not tolerate the medication due to vomiting after administration.  The pt also complained of severe abdominal pain with nausea and vomiting.  Per EMS, the pt had several episodes of clear, mucous like vomitus en route to the ER. She was involuntarily committed, and psych consulted recommending transfer to psychiatric unit . PCCM admitted to [facility] Unit for frequent monitoring, initial laboratory values in ER revealed lactic acidosis. Pt stabilized and transferred to medsurg unit 02/13.    SIGNIFICANT DIAGNOSTIC STUDIES None  SIGNIFICANT EVENTS 02/13-Pt admitted to [facility] Unit due to intentional metformin overdose PCCM contacted to admit pt 02/13-Pt stabilized and transferred to [facility] unit   MICRO DATA  None  ANTIBIOTICS None  CONSULTS Intensivist Psychiatric  TUBES / LINES None   Discharge Exam: General: well developed, well nourished resting in bed, in NAD. Neuro: A&O x 3, non-focal.  HEENT: /AT. PERRL, sclerae anicteric. Cardiovascular: RRR, no M/R/G.  Lungs: Respirations even and unlabored.  CTA bilaterally, No W/R/R.  Abdomen: BS x 4, soft, NT/ND.  Musculoskeletal: No gross deformities, no edema.  Skin: Intact, warm, no rashes.   Vitals:   04/09/16 0200 04/09/16 0255 04/09/16 0559 04/09/16 1257  BP: 107/72 105/73 110/69 110/65  Pulse: 74 71 70 83  Resp: 19 (!) 22 (!) 22 18  Temp:  98.1 F (36.7 C) 98.1 F (36.7 C) 98.5 F (36.9 C)  TempSrc:  Oral Oral Oral  SpO2: 90% 99% 100% 100%  Weight:  74 kg (163 lb 3.2 oz)    Height:  5' (1.524 m)       Discharge Labs  BMET  Recent Labs Lab 04/08/16 0344 04/08/16 0501 04/08/16 0502 04/08/16 0736 04/08/16 1550 04/09/16 0600  NA 137  --  137 134* 135 136  K 3.9  --  3.9 3.8 3.5 3.1*  CL 108  --  110 108 106 102  CO2 18*  --   17* 14* 18* 27  GLUCOSE 166*  --  250* 314* 126* 121*  BUN 11  --  _0 CREATININE 0.49  --  0.62 0.65 0.51 0.43*  CALCIUM 9.7  --  8.2* 8.5* 8.2* 8.2*  MG  --  1.7  --  2.4  --   --   PHOS  --  2.9  --   --   --   --     CBC  Recent Labs Lab 04/08/16 0344 04/08/16 0502 04/09/16 0600  HGB 14.1 12.4 11.9*  HCT 40.9 36.5 34.6*  WBC 12.9* 11.5* 9.6  PLT 272 233 223    Anti-Coagulation No results for input(s): INR in the last 168 hours.        Allergies as of 04/09/2016      Reactions   Penicillins Rash, Other (See Comments)   Has patient had a PCN reaction causing immediate rash, facial/tongue/throat swelling, SOB or lightheadedness with hypotension: No Has patient had a PCN reaction causing severe rash involving mucus membranes or skin necrosis: No Has patient had a PCN reaction that required hospitalization No Has patient had a PCN reaction occurring within the last 10 years: No If all of the above answers are "NO", then may proceed with Cephalosporin use.      Medication List    STOP taking these medications   nitrofurantoin (macrocrystal-monohydrate) 100 MG capsule Commonly known as:  MACROBID     TAKE these medications   metFORMIN 500 MG tablet Commonly known as:  GLUCOPHAGE Take 500 mg by mouth 2 (two) times daily with a meal.   ondansetron 4 MG tablet Commonly known as:  ZOFRAN Take 1 tablet (4 mg total) by mouth every 8 (eight) hours as needed for nausea or vomiting.        Disposition: Pt will discharge to the Psychiatric Unit per Psychiatrist Dr. Weber Cooks recommendations.   Discharged Condition: [name] has met maximum benefit of inpatient care and is medically stable and cleared for discharge.  Patient is pending follow up as above.      Marda Stalker, [place] Pager [phone] (please enter 7 digits) PCCM Consult Pager [phone] (please enter 7 digits)   Pt seen and discharge plan discussed  as  above  Merton Border, MD PCCM service Mobile [phone] Pager [phone] 04/09/2016

## 2016-04-09 NOTE — Plan of Care (Signed)
Problem: Education: Goal: Knowledge of [facility] Education information/materials will improve Outcome: Progressing VSS since arriving to unit, free of falls during shift.  Reported nausea, received PRN IV Zofran 4mg  x1.  No other complaints.  Pt sleeping majority of shift on unit.  Suicide precautions continued.  Sitter and husband at bedside, call bell within reach.  WCTM.

## 2016-04-09 NOTE — Clinical Social Work Note (Signed)
CSW consulted for Intentional Overdose. Pt has been evaluated by psych and inpatient psych treatment is recommended. Pt is ready for discharge and will discharge to [facility], per RN. CSW is signing off as no further needs identified.   Dede QuerySarah Felipa Laroche, MSW, LCSW  Clinical Social Worker  [phone]

## 2016-04-09 NOTE — Tx Team (Signed)
Initial Treatment Plan 04/09/2016 7:17 PM Litzi Monacelli IHK:[id number]    PATIENT STRESSORS: Marital or family conflict   PATIENT STRENGTHS: Average or above average intelligence Motivation for treatment/growth Physical Health Supportive family/friends   PATIENT IDENTIFIED PROBLEMS:   Patient denies problems when asked Only stressors mentioned was occasional issues with teen daughters                   DISCHARGE CRITERIA:  Improved stabilization in mood, thinking, and/or behavior Reduction of life-threatening or endangering symptoms to within safe limits Verbal commitment to aftercare and medication compliance  PRELIMINARY DISCHARGE PLAN: Attend aftercare/continuing care group Participate in family therapy Return to previous living arrangement Return to previous work or school arrangements  PATIENT/FAMILY INVOLVEMENT: This treatment plan has been presented to and reviewed with the patient, Sabrina Hanna,The patient and family have been given the opportunity to ask questions and make suggestions.  Shela NevinValerie S Antrice Pal, RN 04/09/2016, 7:17 PM

## 2016-04-09 NOTE — Progress Notes (Signed)
[facility] All belongings packed and returned to the husband, he was made aware of the new visiting hours for [facility]. 0 paper prescriptions were given to patient. She will be transported down with security. IVC papers will be given to security, her d/c papers were given to the husband.

## 2016-04-10 ENCOUNTER — Encounter: Payer: Self-pay | Admitting: Psychiatry

## 2016-04-10 DIAGNOSIS — F4325 Adjustment disorder with mixed disturbance of emotions and conduct: Secondary | ICD-10-CM

## 2016-04-10 NOTE — [facility] Group Notes (Signed)
[facility] LCSW Group Therapy  04/10/2016 11:00 AM  Type of Therapy:  Group Therapy  Participation Level:  Active  Participation Quality:  Appropriate  Affect:  Appropriate  Cognitive:  Alert  Insight:  Improving  Engagement in Therapy:  Improving  Modes of Intervention:  Activity, Discussion, Education, Problem-solving, Reality Testing and Support  Summary of Progress/Problems: Balance in life: Patients will discuss the concept of balance and how it looks and feels to be unbalanced. Pt will identify areas in their life that is unbalanced and ways to become more balanced. They discussed what aspects in their lives has influenced their self care. Patients also discussed self care in the areas of self regulation/control, hygiene/appearance, sleep/relaxation, healthy leisure, healthy eating habits, exercise, inner peace/spirituality, self improvement, sobriety, and health management. They were challenged to identify changes that are needed in order to improve self care. Patient discussed wanting to improve her diet and start using exercise as a coping skill. CSW provided support to patient.    Kimiyo Carmicheal G. Garnette CzechSampson MSW, LCSWA 04/10/2016, 11:01 AM

## 2016-04-10 NOTE — Plan of Care (Signed)
Problem: Coping: Goal: Ability to cope will improve Outcome: Not Met (add Reason) Isolative. Patient will focus on the present.

## 2016-04-10 NOTE — Progress Notes (Signed)
Recreation Therapy Notes  Date: 02.15.18 Time: 1:00 pm Location: Craft Room  Group Topic: Leisure Education  Goal Area(s) Addresses:  Patient will identify activities for each letter of the alphabet.  Patient will verbalize ability to integrate positive leisure into life post d/c. Patient will verbalize ability to use leisure as a coping skill.  Behavioral Response: Did not attend  Intervention: Leisure Alphabet  Activity: Patients were given a Leisure Alphabet worksheet and were instructed to write healthy leisure activities for each letter of the alphabet.  Education: LRT educated patients on what they need to participate in leisure.  Education Outcome: Patient did not attend group.   Clinical Observations/Feedback: Patient did not attend group.  Cassidie Veiga M, LRT/CTRS 04/10/2016 1:36 PM 

## 2016-04-10 NOTE — [facility] Suicide Risk Assessment (Signed)
[facility] INPATIENT:  Family/Significant Other Suicide Prevention Education  Suicide Prevention Education:  Contact Attempts:Sabrina Hanna(husband [phone]), has been identified by the patient as the family member/significant other with whom the patient will be residing, and identified as the person(s) who will aid the patient in the event of a mental health crisis.  With written consent from the patient, two attempts were made to provide suicide prevention education, prior to and/or following the patient's discharge.  We were unsuccessful in providing suicide prevention education.  A suicide education pamphlet was given to the patient to share with family/significant other.  Date and time of first attempt: 04-10-16 / 3:14pm   Sabrina Hanna G. Garnette CzechSampson MSW, LCSWA 04/10/2016, 3:15 PM

## 2016-04-10 NOTE — [facility] Group Notes (Signed)
[facility] LCSW Group Therapy Note  Type of Therapy and Topic:  Group Therapy:  Goals Group: SMART Goals  Participation Level:  Patient did not attend group. CSW invited patient to group.   Description of Group:   The purpose of a daily goals group is to assist and guide patients in setting recovery/wellness-related goals.  The objective is to set goals as they relate to the crisis in which they were admitted. Patients will be using SMART goal modalities to set measurable goals.  Characteristics of realistic goals will be discussed and patients will be assisted in setting and processing how one will reach their goal. Facilitator will also assist patients in applying interventions and coping skills learned in psycho-education groups to the SMART goal and process how one will achieve defined goal.  Therapeutic Goals: -Patients will develop and document one goal related to or their crisis in which brought them into treatment. -Patients will be guided by LCSW using SMART goal setting modality in how to set a measurable, attainable, realistic and time sensitive goal.  -Patients will process barriers in reaching goal. -Patients will process interventions in how to overcome and successful in reaching goal.   Summary of Patient Progress:  Patient Goal: Patient did not attend group. CSW invited patient to group.    Therapeutic Modalities:   Motivational Interviewing Engineer, manufacturing systemsCognitive Behavioral Therapy Crisis Intervention Model SMART goals setting  Shontay Wallner G. Garnette CzechSampson MSW, LCSWA 04/10/2016 10:59 AM

## 2016-04-10 NOTE — [facility] Group Notes (Signed)
[facility] Group Notes:  (Nursing/MHT/Case Management/Adjunct)  Date:  04/10/2016  Time:  9:25 PM  Type of Therapy:  Psychoeducational Skills  Participation Level:  Did Not Attend  Participation Quality: Summary of Progress/Problems:  Mayra NeerJackie L Annalis Kaczmarczyk 04/10/2016, 9:25 PM

## 2016-04-10 NOTE — H&P (Signed)
Psychiatric Admission Assessment Adult  Patient Identification: Aviana Shevlin MRN:  [id number] Date of Evaluation:  04/10/2016 Chief Complaint:  Suicidal overdose Principal Diagnosis: Adjustment disorder with mixed disturbance of emotions and conduct Diagnosis:   Patient Active Problem List   Diagnosis Date Noted  . Adjustment disorder with mixed disturbance of emotions and conduct [F43.25] 04/10/2016  . Intentional metformin overdose ([place]) [T38.3X2A] 04/08/2016  . Diabetes mellitus without complication ([place]) [I95.1] 04/08/2016   History of Present Illness:   The patient is a 41 year old married [place] female. She presented to our emergency Department on February 13 after an overdose on 40 tablets of 500 mg metformin. The overdose were preceded by an argument with her husband.  Patient tells me that 3 months ago she started having an affair. She has left her husband twice that has decided at the end to come back. She tells me she ended her affair and her husband was willing to work things out with her. He however has been very distrustful. She says that they were having an argument the night of the overdose about her lying to him. She tells me that earlier in the week she had told him she was at a meeting about work and was unable to answer his calls. He realize, by going to her workplace, that she was not in a meeting and this led to the arguments this week.   Patient stated that she has not been having issues with depression. She denies having problems with his sleep, appetite, energy and mood or concentration prior to the overdose. She denies a prior history of depression or suicidal attempts. She states at that time of the overdose she just thought that if she took the medication she was going to stop harming her husband and her daughters.    Patient says the overdose was very impulsive. Her husband was in the restroom when she took the overdose and immediately after she took the  medications he called 911.  Patient denies any history of trauma  Patient denies any history of substance abuse   Associated Signs/Symptoms: Depression Symptoms:  suicidal attempt, (Hypo) Manic Symptoms:  Impulsivity, Anxiety Symptoms:  denies Psychotic Symptoms:  denies PTSD Symptoms: Negative Total Time spent with patient: 1 hour  Past Psychiatric History: Patient denies any history of suicide attempts. No psychiatric treatment or prior hospitalizations  Is the patient at risk to self? Yes.    Has the patient been a risk to self in the past 6 months? No.  Has the patient been a risk to self within the distant past? No.  Is the patient a risk to others? No.  Has the patient been a risk to others in the past 6 months? No.  Has the patient been a risk to others within the distant past? No.     Past Medical History:  Past Medical History:  Diagnosis Date  . Diabetes mellitus without complication [facility])     Past Surgical History:  Procedure Laterality Date  . ABDOMINAL HYSTERECTOMY    . LAPAROSCOPIC UNILATERAL SALPINGO OOPHERECTOMY Right    Family History: History reviewed. No pertinent family history.  Family Psychiatric  History: States that her grandfather was an alcoholic. There is no other family history mental illness or substance abuse. Denies any family history of suicide  Tobacco Screening: Have you used any form of tobacco in the last 30 days? (Cigarettes, Smokeless Tobacco, Cigars, and/or Pipes): No  Social History: Patient has been married with her husband for 22 years.  They have 2 daughters one 34 and the other one 68 year old. 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 Patient denies that they have any financial stressors at this time. She denies any legal problems. She is originally from [place]. She has been in the [place] for 22 years. As far as her education she completed sixth grade in [place] History  Alcohol Use No      History  Drug Use No    Additional Social History:      Allergies:   Allergies  Allergen Reactions  . Penicillins Rash and Other (See Comments)    Has patient had a PCN reaction causing immediate rash, facial/tongue/throat swelling, SOB or lightheadedness with hypotension: No Has patient had a PCN reaction causing severe rash involving mucus membranes or skin necrosis: No Has patient had a PCN reaction that required hospitalization No Has patient had a PCN reaction occurring within the last 10 years: No If all of the above answers are "NO", then may proceed with Cephalosporin use.    Lab Results:  Results for orders placed or performed during the hospital encounter of 04/08/16 (from the past 48 hour(s))  Lactic acid, plasma     Status: Abnormal   Collection Time: 04/08/16  3:50 PM  Result Value Ref Range   Lactic Acid, Venous 2.0 (HH) 0.5 - 1.9 mmol/L    Comment: CRITICAL RESULT CALLED TO, READ BACK BY AND VERIFIED WITH BROOKE GREGORY AT 9030 ON 04/08/2016 JLJ   Basic metabolic panel     Status: Abnormal   Collection Time: 04/08/16  3:50 PM  Result Value Ref Range   Sodium 135 135 - 145 mmol/L   Potassium 3.5 3.5 - 5.1 mmol/L   Chloride 106 101 - 111 mmol/L   CO2 18 (L) 22 - 32 mmol/L   Glucose, Bld 126 (H) 65 - 99 mg/dL   BUN 8 6 - 20 mg/dL   Creatinine, Ser 0.51 0.44 - 1.00 mg/dL   Calcium 8.2 (L) 8.9 - 10.3 mg/dL   GFR calc non Af Amer >60 >60 mL/min   GFR calc Af Amer >60 >60 mL/min    Comment: (NOTE) The eGFR has been calculated using the CKD EPI equation. This calculation has not been validated in all clinical situations. eGFR's persistently <60 mL/min signify possible Chronic Kidney Disease.    Anion gap 11 5 - 15  Glucose, capillary     Status: Abnormal   Collection Time: 04/08/16  4:15 PM  Result Value Ref Range   Glucose-Capillary 109 (H) 65 - 99 mg/dL  Glucose, capillary     Status: Abnormal   Collection Time: 04/08/16  7:52 PM  Result Value Ref Range    Glucose-Capillary 236 (H) 65 - 99 mg/dL  Glucose, capillary     Status: Abnormal   Collection Time: 04/09/16 12:19 AM  Result Value Ref Range   Glucose-Capillary 137 (H) 65 - 99 mg/dL  Glucose, capillary     Status: Abnormal   Collection Time: 04/09/16  4:01 AM  Result Value Ref Range   Glucose-Capillary 127 (H) 65 - 99 mg/dL  CBC with Differential/Platelet     Status: Abnormal   Collection Time: 04/09/16  6:00 AM  Result Value Ref Range   WBC 9.6 3.6 - 11.0 K/uL   RBC 3.89 3.80 - 5.20 MIL/uL   Hemoglobin 11.9 (L) 12.0 - 16.0 g/dL   HCT 34.6 (L) 35.0 - 47.0 %   MCV 89.0 80.0 -  100.0 fL   MCH 30.6 26.0 - 34.0 pg   MCHC 34.4 32.0 - 36.0 g/dL   RDW 12.8 11.5 - 14.5 %   Platelets 223 150 - 440 K/uL   Neutrophils Relative % 67 %   Neutro Abs 6.4 1.4 - 6.5 K/uL   Lymphocytes Relative 25 %   Lymphs Abs 2.4 1.0 - 3.6 K/uL   Monocytes Relative 4 %   Monocytes Absolute 0.4 0.2 - 0.9 K/uL   Eosinophils Relative 2 %   Eosinophils Absolute 0.2 0 - 0.7 K/uL   Basophils Relative 2 %   Basophils Absolute 0.1 0 - 0.1 K/uL  Comprehensive metabolic panel     Status: Abnormal   Collection Time: 04/09/16  6:00 AM  Result Value Ref Range   Sodium 136 135 - 145 mmol/L   Potassium 3.1 (L) 3.5 - 5.1 mmol/L   Chloride 102 101 - 111 mmol/L   CO2 27 22 - 32 mmol/L   Glucose, Bld 121 (H) 65 - 99 mg/dL   BUN 9 6 - 20 mg/dL   Creatinine, Ser 0.43 (L) 0.44 - 1.00 mg/dL   Calcium 8.2 (L) 8.9 - 10.3 mg/dL   Total Protein 6.7 6.5 - 8.1 g/dL   Albumin 3.6 3.5 - 5.0 g/dL   AST 35 15 - 41 U/L   ALT 34 14 - 54 U/L   Alkaline Phosphatase 49 38 - 126 U/L   Total Bilirubin 0.7 0.3 - 1.2 mg/dL   GFR calc non Af Amer >60 >60 mL/min   GFR calc Af Amer >60 >60 mL/min    Comment: (NOTE) The eGFR has been calculated using the CKD EPI equation. This calculation has not been validated in all clinical situations. eGFR's persistently <60 mL/min signify possible Chronic Kidney Disease.    Anion gap 7 5 - 15   Glucose, capillary     Status: Abnormal   Collection Time: 04/09/16  7:30 AM  Result Value Ref Range   Glucose-Capillary 120 (H) 65 - 99 mg/dL   Comment 1 Notify RN   Glucose, capillary     Status: Abnormal   Collection Time: 04/09/16 11:42 AM  Result Value Ref Range   Glucose-Capillary 196 (H) 65 - 99 mg/dL   Comment 1 Notify RN     Blood Alcohol level:  Lab Results  Component Value Date   ETH <5 73/53/2992    Metabolic Disorder Labs:  No results found for: HGBA1C, MPG No results found for: PROLACTIN No results found for: CHOL, TRIG, HDL, CHOLHDL, VLDL, LDLCALC  Current Medications: Current Facility-Administered Medications  Medication Dose Route Frequency Provider Last Rate Last Dose  . acetaminophen (TYLENOL) tablet 650 mg  650 mg Oral Q6H PRN Gonzella Lex, MD      . alum & mag hydroxide-simeth (MAALOX/MYLANTA) 200-200-20 MG/5ML suspension 30 mL  30 mL Oral Q4H PRN Gonzella Lex, MD      . hydrOXYzine (ATARAX/VISTARIL) tablet 25 mg  25 mg Oral TID PRN Gonzella Lex, MD      . magnesium hydroxide (MILK OF MAGNESIA) suspension 30 mL  30 mL Oral Daily PRN Gonzella Lex, MD      . metFORMIN (GLUCOPHAGE) tablet 500 mg  500 mg Oral BID WC Gonzella Lex, MD   500 mg at 04/10/16 0827  . traZODone (DESYREL) tablet 100 mg  100 mg Oral QHS PRN Gonzella Lex, MD       PTA Medications: Prescriptions Prior to Admission  Medication Sig Dispense Refill Last Dose  . metFORMIN (GLUCOPHAGE) 500 MG tablet Take 500 mg by mouth 2 (two) times daily with a meal.   04/08/2016 at Unknown time  . ondansetron (ZOFRAN) 4 MG tablet Take 1 tablet (4 mg total) by mouth every 8 (eight) hours as needed for nausea or vomiting. 20 tablet 0     Musculoskeletal: Strength & Muscle Tone: within normal limits Gait & Station: normal Patient leans: N/A  Psychiatric Specialty Exam: Physical Exam  Constitutional: She is oriented to person, place, and time. She appears well-developed and well-nourished.   HENT:  Head: Normocephalic.  Eyes: Conjunctivae and EOM are normal.  Neck: Normal range of motion.  Respiratory: Effort normal.  Neurological: She is alert and oriented to person, place, and time.    Review of Systems  Constitutional: Negative.   HENT: Negative.   Eyes: Negative.   Respiratory: Negative.   Cardiovascular: Negative.   Gastrointestinal: Negative.   Genitourinary: Negative.   Musculoskeletal: Negative.   Skin: Negative.   Neurological: Negative.   Endo/Heme/Allergies: Negative.   Psychiatric/Behavioral: Negative.     Blood pressure 122/75, pulse 76, temperature 97.8 F (36.6 C), resp. rate 18, height 5' (1.524 m), weight 71.7 kg (158 lb), SpO2 99 %.Body mass index is 30.86 kg/m.  General Appearance: Well Groomed  Eye Contact:  Good  Speech:  Clear and Coherent  Volume:  Normal  Mood:  Euthymic  Affect:  Appropriate and Congruent  Thought Process:  Linear and Descriptions of Associations: Intact  Orientation:  Full (Time, Place, and Person)  Thought Content:  Hallucinations: None  Suicidal Thoughts:  No  Homicidal Thoughts:  No  Memory:  Immediate;   Good Recent;   Good Remote;   Good  Judgement:  Fair  Insight:  Fair  Psychomotor Activity:  Normal  Concentration:  Concentration: Good and Attention Span: Good  Recall:  Good  Fund of Knowledge:  Good  Language:  Good  Akathisia:  No  Handed:    AIMS (if indicated):     Assets:  Communication Skills Physical Health  ADL's:  Intact  Cognition:  WNL  Sleep:  Number of Hours: 5.15    Treatment Plan Summary:  Patient is a 41 year old [place] female who presented to our hospital after an impulsive overdose with metformin right after an argument with her husband.  Patient has been having marital problems for the last 3 months and has been trying to work things out with him.  Adjustment disorder: Currently patient denies having any depressive symptoms. She says she has not been having any issues with  depression. She denies any prior history of psychiatric issues.   For insomnia and she is currently receiving trazodone when necessary  Diabetes the patient has been started on a diabetic diet. She is on metformin 500 mg twice a day.  Diet carb modified  Precautions every 15 minute checks  Hospitalization  status currently voluntary  Follow-up to be determined  Disposition once a stable she will be discharged back to her home.  We'll obtain collateral information from her husband.   Physician Treatment Plan for Primary Diagnosis: Adjustment disorder with mixed disturbance of emotions and conduct Long Term Goal(s): Improvement in symptoms so as ready for discharge  Short Term Goals: Ability to identify changes in lifestyle to reduce recurrence of condition will improve, Ability to verbalize feelings will improve, Ability to demonstrate self-control will improve, Ability to identify and develop effective coping behaviors will improve and Ability to  identify triggers associated with substance abuse/mental health issues will improve  Physician Treatment Plan for Secondary Diagnosis: Principal Problem:   Adjustment disorder with mixed disturbance of emotions and conduct Active Problems:   Intentional metformin overdose ([place])   Diabetes mellitus without complication ([place])  Long Term Goal(s): Improvement in symptoms so as ready for discharge  Short Term Goals: Ability to identify changes in lifestyle to reduce recurrence of condition will improve, Ability to demonstrate self-control will improve, Ability to identify and develop effective coping behaviors will improve and Ability to identify triggers associated with substance abuse/mental health issues will improve  I certify that inpatient services furnished can reasonably be expected to improve the patient's condition.    Hildred Priest, MD 2/15/20181:44 PM

## 2016-04-10 NOTE — Plan of Care (Signed)
Problem: Medication: Goal: Compliance with prescribed medication regimen will improve Outcome: Progressing Patient takes all scheduled medications this shift

## 2016-04-10 NOTE — [facility] Suicide Risk Assessment (Signed)
[facility]   Nursing information obtained from:    Demographic factors:    Current Mental Status:    Loss Factors:    Historical Factors:    Risk Reduction Factors:     Total Time spent with patient: 1 hour Principal Problem: Adjustment disorder with mixed disturbance of emotions and conduct Diagnosis:   Patient Active Problem List   Diagnosis Date Noted  . Adjustment disorder with mixed disturbance of emotions and conduct [F43.25] 04/10/2016  . Intentional metformin overdose ([facility]) [T38.3X2A] 04/08/2016  . Diabetes mellitus without complication ([facility]) [E11.9] 04/08/2016   Subjective Data:   Continued Clinical Symptoms:  Alcohol Use Disorder Identification Test Final Score (AUDIT): 0 The "Alcohol Use Disorders Identification Test", Guidelines for Use in Primary Care, Second Edition.  World Science writerHealth Organization [facility](WHO). Score between 0-7:  no or low risk or alcohol related problems. Score between 8-15:  moderate risk of alcohol related problems. Score between 16-19:  high risk of alcohol related problems. Score 20 or above:  warrants further diagnostic evaluation for alcohol dependence and treatment.   CLINICAL FACTORS:   Depression:   Impulsivity    Psychiatric Specialty Exam: Physical Exam  ROS  Blood pressure 122/75, pulse 76, temperature 97.8 F (36.6 C), resp. rate 18, height 5' (1.524 m), weight 71.7 kg (158 lb), SpO2 99 %.Body mass index is 30.86 kg/m.                                                    Sleep:  Number of Hours: 5.15      COGNITIVE FEATURES THAT CONTRIBUTE TO RISK:  None    SUICIDE RISK:   Moderate:  Frequent suicidal ideation with limited intensity, and duration, some specificity in terms of plans, no associated intent, good self-control, limited dysphoria/symptomatology, some risk factors present, and identifiable protective factors, including available and accessible social support.  [facility]  I certify that inpatient services furnished can reasonably be expected to improve the patient's condition.   Jimmy FootmanHernandez-Gonzalez,  Laury Huizar, MD 04/10/2016, 1:43 PM

## 2016-04-10 NOTE — Progress Notes (Signed)
Patient pleasant and cooperative with care. Med and group compliant. Denies SI, HI, AVH. Encouragement and support offered. Safety checks maintained. Pt receptive and remains safe on unit with q 15 min checks.

## 2016-04-10 NOTE — Progress Notes (Signed)
Recreation Therapy Notes  INPATIENT RECREATION THERAPY ASSESSMENT  Patient Details Name: Sabrina Hanna MRN: [id number] DOB: [birth year] Today's Date: 04/10/2016  Patient Stressors:  Patient reported no stressors.  Coping Skills:   Exercise, Art/Dance, Talking, Sports, Music  Personal Challenges:  Patient reported no personal challenges.  Leisure Interests (2+):  Individual - Other (Comment) (Cook, sew)  Awareness of Community Resources:  No  Community Resources:     Current Use:    If no, Barriers?:    Patient Strengths:  Loving  Patient Identified Areas of Improvement:  No  Current Recreation Participation:  Spending time with family  Patient Goal for Hospitalization:  Work hard and buy a house  [place]  [place] Residence[place]  [place]   Current SI (including self-harm):  No  Current HI:  No  Consent to Intern Participation: N/A   Jacquelynn CreeGreene,Lenna Hagarty M, LRT/CTRS 04/10/2016, 3:32 PM

## 2016-04-10 NOTE — Progress Notes (Signed)
Patient remain in bed throughout the shift. Did not attend group. No PRNs given. No voiced thoughts of hurting herself. No behavior issues noted. q 15 min checks maintained for safety. Will continue to monitor for behavior.

## 2016-04-10 NOTE — BHH Counselor (Signed)
Adult Comprehensive Assessment  Patient ID: Sabrina Hanna, female   DOB: May 10, 1975, 41 y.o.   MRN: [id number]  Information Source: Information source: Patient  Current Stressors:  Educational / Learning stressors: n/a Employment / Job issues: Pt is unemployed Family Relationships: n/a Surveyor, quantity / Lack of resources (include bankruptcy): n/a Housing / Lack of housing: Pt lives with husband Physical health (include injuries & life threatening diseases): n/a Social relationships: n/a Substance abuse: n/a Bereavement / Loss: n/a  Living/Environment/Situation:  Living Arrangements: Spouse/significant other Living conditions (as described by patient or guardian): Pt states it is fine  How long has patient lived in current situation?: 10 years What is atmosphere in current home: Comfortable, Supportive  Family History:  Marital status: Married Number of Years Married: 20 What types of issues is patient dealing with in the relationship?: Financial issues Additional relationship information: n/a Are you sexually active?: Yes What is your sexual orientation?: heterosexual Has your sexual activity been affected by drugs, alcohol, medication, or emotional stress?: n/a Does patient have children?: Yes How many children?: 2 How is patient's relationship with their children?: Pt has two daughters. has a close relationship with them.   Childhood History:  By whom was/is the patient raised?: Both parents Additional childhood history information: n/a Description of patient's relationship with caregiver when they were a child:  Pt states it was good Patient's description of current relationship with people who raised him/her: "It's fine" How were you disciplined when you got in trouble as a child/adolescent?: n/a Does patient have siblings?: Yes Number of Siblings: 6 Description of patient's current relationship with siblings: Pt has six sisters Did patient suffer any  verbal/emotional/physical/sexual abuse as a child?: No Did patient suffer from severe childhood neglect?: No Has patient ever been sexually abused/assaulted/raped as an adolescent or adult?: No Was the patient ever a victim of a crime or a disaster?: No Witnessed domestic violence?: No Has patient been effected by domestic violence as an adult?: No  Education:  Highest grade of school patient has completed: unknown Currently a Consulting civil engineer?: No Learning disability?: No  Employment/Work Situation:   Employment situation: Unemployed Patient's job has been impacted by current illness: No What is the longest time patient has a held a job?: unknown Where was the patient employed at that time?: unknown Has patient ever been in the [employer]?: No Has patient ever served in combat?: No Did You Receive Any Psychiatric Treatment/Services While in Equities trader?: No Are There Guns or Other Weapons in Your Home?: No Are These Comptroller?:  (n/a)  Financial Resources:   Financial resources: Income from spouse Does patient have a representative payee or guardian?: No  Alcohol/Substance Abuse:   What has been your use of drugs/alcohol within the last 12 months?: Patient denies If attempted suicide, did drugs/alcohol play a role in this?: No Alcohol/Substance Abuse Treatment Hx: Denies past history Has alcohol/substance abuse ever caused legal problems?: No  Social Support System:   Patient's Community Support System: Good Describe Community Support System: Pt has support from spouse and family Type of faith/religion: n/a How does patient's faith help to cope with current illness?: n/a  Leisure/Recreation:   Leisure and Hobbies: Pt enjoys cooking  Strengths/Needs:   What things does the patient do well?: "taking care of my family" In what areas does patient struggle / problems for patient: depression, staying busy  Discharge Plan:   Does patient have access to transportation?: Yes  (Husband) Will patient be returning to same living situation after  discharge?: Yes Currently receiving community mental health services: No If no, would patient like referral for services when discharged?: Yes (What county?) [facility]([place]) Does patient have financial barriers related to discharge medications?: No (Pt states she can afford medications.)  Summary/Recommendations:   Patient is a 41 year old female admitted voluntarily with a diagnosis of adjustment disorder with mixed disturbance of emotions and conduct. Information was obtained from psychosocial assessment completed with patient and chart review conducted by this evaluator. Patient presented to the hospital after overdose on 40 tablets of 500 mg metformin. Patient reports primary triggers for admission were her recent affair with husband and distrust from her husband. Patient denies SI/HI during assessment and states the overdose my impulsive. Patient wants to follow-up with [facility] for outpatient services. Patient will benefit from crisis stabilization, medication evaluation, group therapy and psycho education in addition to case management for discharge. At discharge, it is recommended that patient remain compliant with established discharge plan and continued treatment.    Syniyah Bourne G. Garnette CzechSampson MSW, LCSWA 04/10/2016 3:12 PM

## 2016-04-11 NOTE — [facility] Suicide Risk Assessment (Addendum)
[facility] INPATIENT:  Family/Significant Other Suicide Prevention Education  Suicide Prevention Education:  Education Completed;Sabrina PughWalter Hanna (husband [phone]), has been identified by the patient as the family member/significant other with whom the patient will be residing, and identified as the person(s) who will aid the patient in the event of a mental health crisis (suicidal ideations/suicide attempt).  With written consent from the patient, the family member/significant other has been provided the following suicide prevention education, prior to the and/or following the discharge of the patient.  The suicide prevention education provided includes the following:  Suicide risk factors  Suicide prevention and interventions  National Suicide Hotline telephone number  Sabrina Hanna assessment telephone number  Sabrina Hanna 911  Sabrina Hanna telephone number  Request made of family/significant other to:  Remove weapons (e.g., guns, rifles, knives), all items previously/currently identified as safety concern.    Remove drugs/medications (over-the-counter, prescriptions, illicit drugs), all items previously/currently identified as a safety concern.  The family member/significant other verbalizes understanding of the suicide prevention education information provided.  The family member/significant other agrees to remove the items of safety concern listed above.  Sabrina Hanna G. Sabrina CzechSampson MSW, LCSWA 04/11/2016, 9:05 AM

## 2016-04-11 NOTE — [facility] Suicide Risk Assessment (Signed)
[facility]   Principal Problem: Adjustment disorder with mixed disturbance of emotions and conduct Discharge Diagnoses:  Patient Active Problem List   Diagnosis Date Noted  . Adjustment disorder with mixed disturbance of emotions and conduct [F43.25] 04/10/2016  . Intentional metformin overdose (HCC) [T38.3X2A] 04/08/2016  . Diabetes mellitus without complication (HCC) [E11.9] 04/08/2016     Psychiatric Specialty Exam: ROS  Blood pressure 122/75, pulse 76, temperature 97.8 F (36.6 C), resp. rate 18, height 5' (1.524 m), weight 71.7 kg (158 lb), SpO2 99 %.Body mass index is 30.86 kg/m.                                                       Mental Status Per Nursing Assessment::   On Admission:     Demographic Factors:  married, latino, woman, employeed  Loss Factors: marital stressors  Historical Factors: Impulsivity  Risk Reduction Factors:   Responsible for children under 39 years of age, Sense of responsibility to family, Religious beliefs about death, Employed, Living with another person, especially a relative, Positive social support and Positive coping skills or problem solving skills  No access to guns  Continued Clinical Symptoms:  none Cognitive Features That Contribute To Risk:  None    Suicide Risk:  Minimal: No identifiable suicidal ideation.  Patients presenting with no risk factors but with morbid ruminations; may be classified as minimal risk based on the severity of the depressive symptoms   Jimmy FootmanHernandez-Gonzalez,  Dayshawn Irizarry, MD 04/11/2016, 1:24 AM

## 2016-04-11 NOTE — Discharge Summary (Signed)
Physician Discharge Summary Note  Patient:  Sabrina Hanna is an 41 y.o., female MRN:  [id number] DOB:  December 07, 1975 Patient phone:  [phone] (home)  Patient address:   [address] [place] [address],  Total Time spent with patient: 30 minutes  Date of Admission:  04/09/2016 Date of Discharge: 04/11/16  Reason for Admission:  overdose  Principal Problem: Adjustment disorder with mixed disturbance of emotions and conduct Discharge Diagnoses: Patient Active Problem List   Diagnosis Date Noted  . Adjustment disorder with mixed disturbance of emotions and conduct [F43.25] 04/10/2016  . Intentional metformin overdose ([place]) [T38.3X2A] 04/08/2016  . Diabetes mellitus without complication ([place]) [E72.0] 04/08/2016   History of Present Illness:   The patient is a 41 year old married [name] female. She presented to our emergency Department on February 13 after an overdose on 40 tablets of 500 mg metformin. The overdose were preceded by an argument with her husband.  Patient tells me that 3 months ago she started having an affair. She has left her husband twice that has decided at the end to come back. She tells me she ended her affair and her husband was willing to work things out with her. He however has been very distrustful. She says that they were having an argument the night of the overdose about her lying to him. She tells me that earlier in the week she had told him she was at a meeting about work and was unable to answer his calls. He realize, by going to her workplace, that she was not in a meeting and this led to the arguments this week.   Patient stated that she has not been having issues with depression. She denies having problems with his sleep, appetite, energy and mood or concentration prior to the overdose. She denies a prior history of depression or suicidal attempts. She states at that time of the overdose she just thought that if she took the medication she was going to  stop harming her husband and her daughters.    Patient says the overdose was very impulsive. Her husband was in the restroom when she took the overdose and immediately after she took the medications he called 911.  Patient denies any history of trauma  Patient denies any history of substance abuse  Past Psychiatric History: patient denies any history of suicide attempts. No psychiatric treatment or prior hospitalizations  Past Medical History:  Past Medical History:  Diagnosis Date  . Diabetes mellitus without complication [facility])     Past Surgical History:  Procedure Laterality Date  . ABDOMINAL HYSTERECTOMY    . LAPAROSCOPIC UNILATERAL SALPINGO OOPHERECTOMY Right    Family History: History reviewed. No pertinent family history.   Family Psychiatric  History: States that her grandfather was an alcoholic. There is no other family history mental illness or substance abuse. Denies any family history of suicide   Social History: Patient has been married with her husband for 22 years. They have 2 daughters one 18 and the other one 72 year old. 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 Patient denies that they have any financial stressors at this time. She denies any legal problems. She is originally from [place]. She has been in the [place] for 22 years. As far as her education she completed sixth grade in [place] History  Alcohol Use No     History  Drug Use No    Social History   Social History  . Marital status: Married  Spouse name: N/A  . Number of children: N/A  . Years of education: N/A   Social History Main Topics  . Smoking status: Never Smoker  . Smokeless tobacco: Never Used  . Alcohol use No  . Drug use: No  . Sexual activity: Yes   Other Topics Concern  . None   Social History Narrative  . None    Hospital Course:    Patient is a 41 year old [name] female who presented to our hospital after an impulsive overdose  with metformin right after an argument with her husband.  Patient has been having marital problems for the last 3 months and has been trying to work things out with him.  Adjustment disorder: Currently patient denies having any depressive symptoms. She says she has not been having any issues with depression. She denies any prior history of psychiatric issues.   For insomnia and she is currently receiving trazodone when necessary  Diabetes the patient has been started on a diabetic diet. She is on metformin 500 mg twice a day.  Collateral information was obtained from the patient's husband. He is advocating for the patient to be discharged today as she has some job interview later today. He feels that the patient is much improved and is not really requiring psychiatric inpatient treatment. He confirms the information provided by the patient yesterday. There is no access guns in the home or anywhere else.  This hospitalization was uneventful. The patient was pleasant, and cooperative. She did not display any unsafe or disruptive behaviors. She had appropriate interactions with peers and staff. She participated in some groups. She is currently denying any symptoms consistent with major depressive disorder. She denied depressed mood, suicidality, homicidality, hopelessness, helplessness. She is denying any psychotic symptoms. She denies any symptoms with mania or hypomania. She does not have a history of substance abuse patient does not have a prior psychiatric history. There is no family history of mental illness. Patient does not have any access to guns. She is employed, religious, has children under the age of 42  Stop working with the patient does not have any concerns about her safety or the safety of others upon discharge.  Today during assessment patient was pleasant and bright and cooperative. She was seen during treatment team meeting. Patient felt safe and ready for discharge  Physical  Findings: AIMS: Facial and Oral Movements Muscles of Facial Expression: None, normal Lips and Perioral Area: None, normal Jaw: None, normal Tongue: None, normal,Extremity Movements Upper (arms, wrists, hands, fingers): None, normal Lower (legs, knees, ankles, toes): None, normal, Trunk Movements Neck, shoulders, hips: None, normal, Overall Severity Severity of abnormal movements (highest score from questions above): None, normal Incapacitation due to abnormal movements: None, normal Patient's awareness of abnormal movements (rate only patient's report): No Awareness, Dental Status Current problems with teeth and/or dentures?: No Does patient usually wear dentures?: No  CIWA:  CIWA-Ar Total: 0 COWS:     Musculoskeletal: Strength & Muscle Tone: within normal limits Gait & Station: normal Patient leans: N/A  Psychiatric Specialty Exam: Physical Exam  Constitutional: She is oriented to person, place, and time. She appears well-developed and well-nourished.  HENT:  Head: Normocephalic and atraumatic.  Eyes: Conjunctivae are normal.  Neck: Normal range of motion.  Respiratory: Effort normal.  Musculoskeletal: Normal range of motion.  Neurological: She is alert and oriented to person, place, and time.    Review of Systems  Constitutional: Negative.   HENT: Negative.   Eyes: Negative.  Respiratory: Negative.   Cardiovascular: Negative.   Gastrointestinal: Negative.   Genitourinary: Negative.   Musculoskeletal: Negative.   Skin: Negative.   Neurological: Negative.   Endo/Heme/Allergies: Negative.   Psychiatric/Behavioral: Negative.     Blood pressure 122/75, pulse 76, temperature 97.8 F (36.6 C), resp. rate 18, height 5' (1.524 m), weight 71.7 kg (158 lb), SpO2 99 %.Body mass index is 30.86 kg/m.  General Appearance: Well Groomed  Eye Contact:  Good  Speech:  Clear and Coherent  Volume:  Normal  Mood:  Euthymic  Affect:  Appropriate and Congruent  Thought Process:   Coherent, Goal Directed, Linear and Descriptions of Associations: Intact  Orientation:  Full (Time, Place, and Person)  Thought Content:  Hallucinations: None  Suicidal Thoughts:  No  Homicidal Thoughts:  No  Memory:  Immediate;   Good Recent;   Good Remote;   Good  Judgement:  Fair  Insight:  Fair  Psychomotor Activity:  Normal  Concentration:  Concentration: Good and Attention Span: Good  Recall:  Good  Fund of Knowledge:  Good  Language:  Good  Akathisia:  No  Handed:    AIMS (if indicated):     Assets:  Agricultural consultant Housing Leisure Time [place] Talents/Skills Transportation Vocational/Educational  ADL's:  Intact  Cognition:  WNL  Sleep:  Number of Hours: 5.15     Have you used any form of tobacco in the last 30 days? (Cigarettes, Smokeless Tobacco, Cigars, and/or Pipes): No  Has this patient used any form of tobacco in the last 30 days? (Cigarettes, Smokeless Tobacco, Cigars, and/or Pipes) Yes, No  Blood Alcohol level:  Lab Results  Component Value Date   ETH <5 51/76/1607    Metabolic Disorder Labs:  No results found for: HGBA1C, MPG No results found for: PROLACTIN No results found for: CHOL, TRIG, HDL, CHOLHDL, VLDL, LDLCALC   Results for MIRAYAH, WREN (MRN [id number]) as of 04/11/2016 01:29  Ref. Range 04/09/2016 06:00 04/09/2016 07:30 04/09/2016 11:42  COMPREHENSIVE METABOLIC PANEL Unknown Rpt (A)    Sodium Latest Ref Range: 135 - 145 mmol/L 136    Potassium Latest Ref Range: 3.5 - 5.1 mmol/L 3.1 (L)    Chloride Latest Ref Range: 101 - 111 mmol/L 102    CO2 Latest Ref Range: 22 - 32 mmol/L 27    Glucose Latest Ref Range: 65 - 99 mg/dL 121 (H)    BUN Latest Ref Range: 6 - 20 mg/dL 9    Creatinine Latest Ref Range: 0.44 - 1.00 mg/dL 0.43 (L)    Calcium Latest Ref Range: 8.9 - 10.3 mg/dL 8.2 (L)    Anion gap Latest Ref Range: 5 - 15  7    Alkaline Phosphatase Latest Ref Range:  38 - 126 U/L 49    Albumin Latest Ref Range: 3.5 - 5.0 g/dL 3.6    AST Latest Ref Range: 15 - 41 U/L 35    ALT Latest Ref Range: 14 - 54 U/L 34    Total Protein Latest Ref Range: 6.5 - 8.1 g/dL 6.7    Total Bilirubin Latest Ref Range: 0.3 - 1.2 mg/dL 0.7    EGFR (African American) Latest Ref Range: >60 mL/min >60    EGFR (Non-African Amer.) Latest Ref Range: >60 mL/min >60    WBC Latest Ref Range: 3.6 - 11.0 K/uL 9.6    RBC Latest Ref Range: 3.80 - 5.20 MIL/uL 3.89    Hemoglobin Latest Ref Range: 12.0 - 16.0 g/dL  11.9 (L)    HCT Latest Ref Range: 35.0 - 47.0 % 34.6 (L)    MCV Latest Ref Range: 80.0 - 100.0 fL 89.0    MCH Latest Ref Range: 26.0 - 34.0 pg 30.6    MCHC Latest Ref Range: 32.0 - 36.0 g/dL 34.4    RDW Latest Ref Range: 11.5 - 14.5 % 12.8    Platelets Latest Ref Range: 150 - 440 K/uL 223    Neutrophils Latest Units: % 67    Lymphocytes Latest Units: % 25    Monocytes Relative Latest Units: % 4    Eosinophil Latest Units: % 2    Basophil Latest Units: % 2    NEUT# Latest Ref Range: 1.4 - 6.5 K/uL 6.4    Lymphocyte # Latest Ref Range: 1.0 - 3.6 K/uL 2.4    Monocyte # Latest Ref Range: 0.2 - 0.9 K/uL 0.4    Eosinophils Absolute Latest Ref Range: 0 - 0.7 K/uL 0.2    Basophils Absolute Latest Ref Range: 0 - 0.1 K/uL 0.1     Results for ARYANAH, ENSLOW (MRN [id number]) as of 04/11/2016 01:29  Ref. Range 04/08/2016 09:00  Appearance Latest Ref Range: CLEAR  CLEAR (A)  Bacteria, UA Latest Ref Range: NONE SEEN  NONE SEEN  Bilirubin Urine Latest Ref Range: NEGATIVE  NEGATIVE  Color, Urine Latest Ref Range: YELLOW  STRAW (A)  Glucose Latest Ref Range: NEGATIVE mg/dL >=500 (A)  Hgb urine dipstick Latest Ref Range: NEGATIVE  NEGATIVE  Ketones, ur Latest Ref Range: NEGATIVE mg/dL 20 (A)  Leukocytes, UA Latest Ref Range: NEGATIVE  NEGATIVE  Mucous Unknown PRESENT  Nitrite Latest Ref Range: NEGATIVE  NEGATIVE  pH Latest Ref Range: 5.0 - 8.0  5.0  Protein Latest Ref Range:  NEGATIVE mg/dL NEGATIVE  RBC / HPF Latest Ref Range: 0 - 5 RBC/hpf NONE SEEN  Specific Gravity, Urine Latest Ref Range: 1.005 - 1.030  1.013  Squamous Epithelial / LPF Latest Ref Range: NONE SEEN  NONE SEEN  WBC, UA Latest Ref Range: 0 - 5 WBC/hpf 0-5    See Psychiatric Specialty Exam and Suicide Risk Assessment completed by Attending Physician prior to discharge.  Discharge destination:  Home  Is patient on multiple antipsychotic therapies at discharge:  No   Has Patient had three or more failed trials of antipsychotic monotherapy by history:  No  Recommended Plan for Multiple Antipsychotic Therapies: NA   Allergies as of 04/11/2016      Reactions   Penicillins Rash, Other (See Comments)   Has patient had a PCN reaction causing immediate rash, facial/tongue/throat swelling, SOB or lightheadedness with hypotension: No Has patient had a PCN reaction causing severe rash involving mucus membranes or skin necrosis: No Has patient had a PCN reaction that required hospitalization No Has patient had a PCN reaction occurring within the last 10 years: No If all of the above answers are "NO", then may proceed with Cephalosporin use.      Medication List    STOP taking these medications   ondansetron 4 MG tablet Commonly known as:  ZOFRAN     TAKE these medications     Indication  metFORMIN 500 MG tablet Commonly known as:  GLUCOPHAGE Take 500 mg by mouth 2 (two) times daily with a meal.  Indication:  Type 2 Diabetes       >30 minutes. >50 % of the time was spent in coordination of care.  Signed: Hildred Priest, MD 04/11/2016, 1:26 AM

## 2016-04-11 NOTE — Tx Team (Signed)
Interdisciplinary Treatment and Diagnostic Plan Update  04/11/2016 Time of Session: 10:30am Sabrina Hanna MRN: [id number]  Principal Diagnosis: Adjustment disorder with mixed disturbance of emotions and conduct  Secondary Diagnoses: Principal Problem:   Adjustment disorder with mixed disturbance of emotions and conduct Active Problems:   Intentional metformin overdose (HCC)   Diabetes mellitus without complication (HCC)   Current Medications:  Current Facility-Administered Medications  Medication Dose Route Frequency Provider Last Rate Last Dose  . acetaminophen (TYLENOL) tablet 650 mg  650 mg Oral Q6H PRN Audery AmelJohn T Clapacs, MD      . alum & mag hydroxide-simeth (MAALOX/MYLANTA) 200-200-20 MG/5ML suspension 30 mL  30 mL Oral Q4H PRN Audery AmelJohn T Clapacs, MD      . hydrOXYzine (ATARAX/VISTARIL) tablet 25 mg  25 mg Oral TID PRN Audery AmelJohn T Clapacs, MD      . magnesium hydroxide (MILK OF MAGNESIA) suspension 30 mL  30 mL Oral Daily PRN Audery AmelJohn T Clapacs, MD      . metFORMIN (GLUCOPHAGE) tablet 500 mg  500 mg Oral BID WC Audery AmelJohn T Clapacs, MD   500 mg at 04/11/16 0906  . traZODone (DESYREL) tablet 100 mg  100 mg Oral QHS PRN Audery AmelJohn T Clapacs, MD       PTA Medications: Prescriptions Prior to Admission  Medication Sig Dispense Refill Last Dose  . metFORMIN (GLUCOPHAGE) 500 MG tablet Take 500 mg by mouth 2 (two) times daily with a meal.   04/08/2016 at Unknown time  . ondansetron (ZOFRAN) 4 MG tablet Take 1 tablet (4 mg total) by mouth every 8 (eight) hours as needed for nausea or vomiting. 20 tablet 0     Patient Stressors: Marital or family conflict  Patient Strengths: Average or above average intelligence Motivation for treatment/growth Physical Health Supportive family/friends  Treatment Modalities: Medication Management, Group therapy, Case management,  1 to 1 session with clinician, Psychoeducation, Recreational therapy.   Physician Treatment Plan for Primary Diagnosis: Adjustment disorder  with mixed disturbance of emotions and conduct Long Term Goal(s): Improvement in symptoms so as ready for discharge Improvement in symptoms so as ready for discharge   Short Term Goals: Ability to identify changes in lifestyle to reduce recurrence of condition will improve Ability to verbalize feelings will improve Ability to demonstrate self-control will improve Ability to identify and develop effective coping behaviors will improve Ability to identify triggers associated with substance abuse/mental health issues will improve Ability to identify changes in lifestyle to reduce recurrence of condition will improve Ability to demonstrate self-control will improve Ability to identify and develop effective coping behaviors will improve Ability to identify triggers associated with substance abuse/mental health issues will improve  Medication Management: Evaluate patient's response, side effects, and tolerance of medication regimen.  Therapeutic Interventions: 1 to 1 sessions, Unit Group sessions and Medication administration.  Evaluation of Outcomes: Adequate for Discharge  Physician Treatment Plan for Secondary Diagnosis: Principal Problem:   Adjustment disorder with mixed disturbance of emotions and conduct Active Problems:   Intentional metformin overdose (HCC)   Diabetes mellitus without complication (HCC)  Long Term Goal(s): Improvement in symptoms so as ready for discharge Improvement in symptoms so as ready for discharge   Short Term Goals: Ability to identify changes in lifestyle to reduce recurrence of condition will improve Ability to verbalize feelings will improve Ability to demonstrate self-control will improve Ability to identify and develop effective coping behaviors will improve Ability to identify triggers associated with substance abuse/mental health issues will improve Ability to identify changes  in lifestyle to reduce recurrence of condition will improve Ability to  demonstrate self-control will improve Ability to identify and develop effective coping behaviors will improve Ability to identify triggers associated with substance abuse/mental health issues will improve     Medication Management: Evaluate patient's response, side effects, and tolerance of medication regimen.  Therapeutic Interventions: 1 to 1 sessions, Unit Group sessions and Medication administration.  Evaluation of Outcomes: Adequate for Discharge   RN Treatment Plan for Primary Diagnosis: Adjustment disorder with mixed disturbance of emotions and conduct Long Term Goal(s): Knowledge of disease and therapeutic regimen to maintain health will improve  Short Term Goals: Ability to remain free from injury will improve, Ability to demonstrate self-control, Ability to participate in decision making will improve, Ability to verbalize feelings will improve, Ability to disclose and discuss suicidal ideas and Compliance with prescribed medications will improve  Medication Management: RN will administer medications as ordered by provider, will assess and evaluate patient's response and provide education to patient for prescribed medication. RN will report any adverse and/or side effects to prescribing provider.  Therapeutic Interventions: 1 on 1 counseling sessions, Psychoeducation, Medication administration, Evaluate responses to treatment, Monitor vital signs and CBGs as ordered, Perform/monitor CIWA, COWS, AIMS and Fall Risk screenings as ordered, Perform wound care treatments as ordered.  Evaluation of Outcomes: Adequate for Discharge   LCSW Treatment Plan for Primary Diagnosis: Adjustment disorder with mixed disturbance of emotions and conduct Long Term Goal(s): Safe transition to appropriate next level of care at discharge, Engage patient in therapeutic group addressing interpersonal concerns.  Short Term Goals: Engage patient in aftercare planning with referrals and resources, Increase  social support, Increase ability to appropriately verbalize feelings, Increase emotional regulation, Facilitate acceptance of mental health diagnosis and concerns and Increase skills for wellness and recovery  Therapeutic Interventions: Assess for all discharge needs, 1 to 1 time with Social worker, Explore available resources and support systems, Assess for adequacy in community support network, Educate family and significant other(s) on suicide prevention, Complete Psychosocial Assessment, Interpersonal group therapy.  Evaluation of Outcomes: Adequate for Discharge   Progress in Treatment: Attending groups: Yes. Participating in groups: Yes. Taking medication as prescribed: Yes. Toleration medication: Yes. Family/Significant other contact made: Yes, individual(s) contacted:  husband Patient understands diagnosis: Yes. Discussing patient identified problems/goals with staff: Yes. Medical problems stabilized or resolved: Yes. Denies suicidal/homicidal ideation: Yes. Issues/concerns per patient self-inventory: No. Other: n/a  New problem(s) identified: None identified at this time.   New Short Term/Long Term Goal(s): None identified at this time.   Discharge Plan or Barriers: Patient will discharge home and follow-up with [facility] for outpatient services.   Reason for Continuation of Hospitalization: Discharge 04/11/2016  Estimated Length of Stay: Discharge 04/11/2016  Attendees: Patient: Sabrina Hanna 04/11/2016 11:14 AM  Physician: Dr. Jimmy Footman, MD 04/11/2016 11:14 AM  Nursing: Leonia Reader, RN 04/11/2016 11:14 AM  RN Care Manager: 04/11/2016 11:14 AM  Social Worker: Fredrich Birks. Garnette Czech MSW, LCSWA 04/11/2016 11:14 AM  Recreational Therapist: Jacquelynn Cree, LRT/CTRS 04/11/2016 11:14 AM  Other:  04/11/2016 11:14 AM  Other:  04/11/2016 11:14 AM  Other: 04/11/2016 11:14 AM    Scribe for Treatment Team: Arelia Longest, LCSWA 04/11/2016 11:17 AM

## 2016-04-11 NOTE — Progress Notes (Addendum)
D: Patient is aware of  Discharge this shift .Patient denies suicidal /homicidal ideations. Patient received all belongings brought in  A: No Storage medications. Writer reviewed Discharge Summary, Suicide Risk Assessment, and Transitional Record.  A: Writer instructed on discharge criteria  .  Marland Kitchen. Aware  Of follow up appointment . R: Patient left unit with no questions  Or concerns  With husband.

## 2016-04-11 NOTE — Progress Notes (Signed)
  [facility] :  Will you be returning to the same living situation after discharge:  Yes,  home with husband and daughters At discharge, do you have transportation home?: Yes,  husband Do you have the ability to pay for your medications: Yes,  patient has financial support from husband and income  Release of information consent forms completed and in the chart;  Patient's signature needed at discharge.  Patient to Follow up at: Follow-up Information    [facility]. Go on 04/14/2016.   Why:  Follow-up appointment on this date at 2:30pm for outpatient services. Bring I.D., current medications, and discharge summary to this appointment.  Contact information: [address] [place] [address] [phone]           Next level of care provider has access to [facility] Link:no  Safety Planning and Suicide Prevention discussed: Yes,  with husband and patient  Have you used any form of tobacco in the last 30 days? (Cigarettes, Smokeless Tobacco, Cigars, and/or Pipes): No  Has patient been referred to the Quitline?: N/A patient is not a smoker  Patient has been referred for addiction treatment: Yes  Glenda Kunst G. Garnette CzechSampson MSW, LCSWA 04/11/2016, 11:12 AM

## 2016-04-11 NOTE — Progress Notes (Deleted)
  [facility]  Will you be returning to the same living situation after discharge:  Yes,  home with husband and children At discharge, do you have transportation home?: Yes,  husband Do you have the ability to pay for your medications: Yes,  patient has financial support from income and husband  Release of information consent forms completed and in the chart;  Patient's signature needed at discharge.  Patient to Follow up at: Follow-up Information    [facility] Go on 04/14/2016.   Why:  Follow-up appointment on this date at 8am for outpatient services. Bring I.D., current medications, and discharge summary to this appointment.  Contact information: [address] [place] [address] [phone]           Next level of care provider has access to [facility] Link:no  Safety Planning and Suicide Prevention discussed: Yes,  with patient and husband  Have you used any form of tobacco in the last 30 days? (Cigarettes, Smokeless Tobacco, Cigars, and/or Pipes): No  Has patient been referred to the Quitline?: N/A patient is not a smoker  Patient has been referred for addiction treatment: Yes  Sabrina Hanna G. Garnette CzechSampson MSW, LCSWA 04/11/2016, 9:14 AM

## 2016-04-11 NOTE — Plan of Care (Signed)
Problem: Activity: Goal: Sleeping patterns will improve Outcome: Progressing Patient slept for Estimated Hours of 6.15; every 15 minutes safety round maintained, no injury or falls during this shift.    

## 2016-04-11 NOTE — Progress Notes (Signed)
Patient ID: Sabrina SpruceJosefina Hanna, female   DOB: July 25, 1975, 41 y.o.   MRN: [id number] Bright affect, mood happy and looking forward to discharge, "my husband don't eat for I am not home, my daughter said this to me ..." patient said in broken english; no bedtime medication; none scheduled. Denied SI/SIB/HI, denied AV/H.

## 2016-04-12 NOTE — [facility] Group Notes (Signed)
[facility] LCSW Group Therapy  04/12/2016 9:17 AM (Late Entry from 04/11/2016)  Type of Therapy:  Group Therapy  Participation Level:  Minimal  Participation Quality:  Attentive  Affect:  Appropriate  Cognitive:  Alert  Insight:  Improving  Engagement in Therapy:  Limited  Modes of Intervention:  Activity, Discussion, Education, Problem-solving, Reality Testing and Support  Summary of Progress/Problems:Feelings around Relapse. Group members discussed the meaning of relapse and shared personal stories of relapse, how it affected them and others, and how they perceived themselves during this time. Group members were encouraged to identify triggers, warning signs and coping skills used when facing the possibility of relapse. Social supports were discussed and explored in detail. Patients also discussed facing disappointment and how that can trigger someone to relapse. Patient arrived late to group on this date. Patient shared she was glad to be going home and excited about starting her new job next week. CSW provided support to patient and discussed coping skills the patient could use at home. Patient discussed the support from her husband and children.    Antonique Langford G. Garnette CzechSampson MSW, LCSWA 04/12/2016, 9:19 AM

## 2017-01-03 ENCOUNTER — Other Ambulatory Visit: Payer: Self-pay

## 2017-01-03 ENCOUNTER — Encounter: Payer: Self-pay | Admitting: Emergency Medicine

## 2017-01-03 ENCOUNTER — Emergency Department
Admission: EM | Admit: 2017-01-03 | Discharge: 2017-01-03 | Disposition: A | Discharge status: Home / Self Care | Payer: Self-pay | Attending: Emergency Medicine | Admitting: Emergency Medicine

## 2017-01-03 DIAGNOSIS — R300 Dysuria: Secondary | ICD-10-CM | POA: Insufficient documentation

## 2017-01-03 DIAGNOSIS — R739 Hyperglycemia, unspecified: Secondary | ICD-10-CM

## 2017-01-03 DIAGNOSIS — E1165 Type 2 diabetes mellitus with hyperglycemia: Secondary | ICD-10-CM | POA: Insufficient documentation

## 2017-01-03 DIAGNOSIS — Z7984 Long term (current) use of oral hypoglycemic drugs: Secondary | ICD-10-CM | POA: Insufficient documentation

## 2017-01-03 LAB — URINALYSIS, COMPLETE (UACMP) WITH MICROSCOPIC
Bilirubin Urine: NEGATIVE
HGB URINE DIPSTICK: NEGATIVE
Ketones, ur: 5 mg/dL — AB
LEUKOCYTES UA: NEGATIVE
NITRITE: NEGATIVE
Protein, ur: NEGATIVE mg/dL
SPECIFIC GRAVITY, URINE: 1.018 (ref 1.005–1.030)
pH: 5 (ref 5.0–8.0)

## 2017-01-03 LAB — GLUCOSE, CAPILLARY: GLUCOSE-CAPILLARY: 312 mg/dL — AB (ref 65–99)

## 2017-01-03 MED ORDER — SULFAMETHOXAZOLE-TRIMETHOPRIM 800-160 MG PO TABS: 1.0000 | ORAL_TABLET | Freq: Two times a day (BID) | ORAL | 0 refills | Status: DC

## 2017-01-03 NOTE — ED Notes (Signed)
Pt discharged to home.  Family member driving.  Discharge instructions reviewed.  Verbalized understanding.  No questions or concerns at this time.  Teach back verified.  Pt in NAD.  No items left in ED.   

## 2017-01-03 NOTE — ED Notes (Signed)
Pt c/o burning with urination. States was already seen given a pill for infection but the pill has not worked. Pt c/o "burning, hot" when she pees. Pt also c/o burning sensation in her lower abdomen and also c/o pain in her lower back.

## 2017-01-03 NOTE — ED Notes (Signed)
This RN called for patient twice will call again in 10 mins.

## 2017-01-03 NOTE — ED Provider Notes (Signed)
[facility] Emergency Department Provider Note  ____________________________________________  Time seen: Approximately 3:55 PM  I have reviewed the triage vital signs and the nursing notes.   HISTORY  Chief Complaint Urinary Tract Infection (since sunday)    HPI Sabrina Hanna is a 41 y.o. female that presents to the emergency department for evaluation of burning with urination for 1.5 weeks.  She went to urgent care earlier this week and was told that she has a urinary tract infection. She states that she is still having pain with urination and chills.  She states that her lower abdomen feels "burning" but only when she urinates.  She has been taking her metformin as prescribed.  She does not check her blood sugar.  She does not have a primary care provider because she does not have insurance.  She states that she is also eating greasy foods and not watching what she is eating.  She does not exercise.  Her husband states that he does not want her to exercise because he lonely when she leaves and he does not have time to exercise.  No nausea, vomiting, abdominal pain, vaginal discharge, STD concern.  Past Medical History:  Diagnosis Date  . Diabetes mellitus without complication [facility](HCC)     Patient Active Problem List   Diagnosis Date Noted  . 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 Intentional metformin overdose (HCC) 04/08/2016  . Diabetes mellitus without complication (HCC) 04/08/2016    Past Surgical History:  Procedure Laterality Date  . ABDOMINAL HYSTERECTOMY    . LAPAROSCOPIC UNILATERAL SALPINGO OOPHERECTOMY Right     Prior to Admission medications   Medication Sig Start Date End Date Taking? Authorizing Provider  metFORMIN (GLUCOPHAGE) 500 MG tablet Take 500 mg by mouth 2 (two) times daily with a meal.    [provider]  sulfamethoxazole-trimethoprim (BACTRIM DS,SEPTRA DS) 800-160 MG tablet  Take 1 tablet 2 (two) times daily by mouth. 01/03/17   Enid DerryWagner, Naz Denunzio, PA-C    Allergies Penicillins  History reviewed. No pertinent family history.  Social History Social History   Tobacco Use  . Smoking status: Never Smoker  . Smokeless tobacco: Never Used  Substance Use Topics  . Alcohol use: No  . Drug use: No     Review of Systems  Constitutional: No fever Cardiovascular: No chest pain. Respiratory:  No SOB. Gastrointestinal: No nausea, no vomiting.  Musculoskeletal: Negative for musculoskeletal pain. Skin: Negative for rash, abrasions, lacerations, ecchymosis.   ____________________________________________   PHYSICAL EXAM:  VITAL SIGNS: ED Triage Vitals  Enc Vitals Group     BP 01/03/17 1327 126/80     Pulse Rate 01/03/17 1327 88     Resp 01/03/17 1327 16     Temp 01/03/17 1327 98.6 F (37 C)     Temp Source 01/03/17 1327 Oral     SpO2 01/03/17 1327 98 %     Weight 01/03/17 1333 160 lb (72.6 kg)     Height 01/03/17 1333 5' (1.524 m)     Head Circumference --      Peak Flow --      Pain Score 01/03/17 1332 10     Pain Loc --      Pain Edu? --      Excl. in GC? --      Constitutional: Alert and oriented. Well appearing and in no acute distress. Eyes: Conjunctivae are normal. PERRL. EOMI. Head: Atraumatic. ENT:      Ears:  Nose: No congestion/rhinnorhea.      Mouth/Throat: Mucous membranes are moist.  Neck: No stridor.   Cardiovascular: Normal rate, regular rhythm.  Good peripheral circulation. Respiratory: Normal respiratory effort without tachypnea or retractions. Lungs CTAB. Good air entry to the bases with no decreased or absent breath sounds. Gastrointestinal: Bowel sounds 4 quadrants. Soft and nontender to palpation. No guarding or rigidity. No palpable masses. No distention. No CVA tenderness. Musculoskeletal: Full range of motion to all extremities. No gross deformities appreciated. Neurologic:  Normal speech and language. No gross  focal neurologic deficits are appreciated.  Skin:  Skin is warm, dry and intact. No rash noted.   ____________________________________________   LABS (all labs ordered are listed, but only abnormal results are displayed)  Labs Reviewed  URINALYSIS, COMPLETE (UACMP) WITH MICROSCOPIC - Abnormal; Notable for the following components:      Result Value   Color, Urine YELLOW (*)    APPearance CLEAR (*)    Glucose, UA >=500 (*)    Ketones, ur 5 (*)    Bacteria, UA RARE (*)    Squamous Epithelial / LPF 0-5 (*)    All other components within normal limits  GLUCOSE, CAPILLARY - Abnormal; Notable for the following components:   Glucose-Capillary 312 (*)    All other components within normal limits  URINE CULTURE  CBG MONITORING, ED   ____________________________________________  EKG   ____________________________________________  RADIOLOGY  No results found.  ____________________________________________    PROCEDURES  Procedure(s) performed:    Procedures    Medications - No data to display   ____________________________________________   INITIAL IMPRESSION / ASSESSMENT AND PLAN / ED COURSE  Pertinent labs & imaging results that were available during my care of the patient were reviewed by me and considered in my medical decision making (see chart for details).  Review of the [place] CSRS was performed in accordance of the [place] prior to dispensing any controlled drugs.   Patient presented to the emergency department for evaluation of dysuria for 1.5 weeks.  Vital signs and exam are reassuring.  Patient was told by urgent care earlier this week that she has urinary tract infection.  She was placed on Cipro and states that symptoms have not improved.  Urinalysis shows rare bacteria, which is likely contamination but will be sent for culture.  She will be treated based on her symptoms.  Patient has glucose in urine.  Blood sugar was 312.  Education about diabetes was  provided.  Patient will be discharged home with prescriptions for Bactrim.  Patient is to follow up with PCP as directed. Patient is given ED precautions to return to the ED for any worsening or new symptoms.     ____________________________________________  FINAL CLINICAL IMPRESSION(S) / ED DIAGNOSES  Final diagnoses:  Dysuria  Hyperglycemia      NEW MEDICATIONS STARTED DURING THIS VISIT:  This SmartLink is deprecated. Use AVSMEDLIST instead to display the medication list for a patient.      This chart was dictated using voice recognition software/Dragon. Despite best efforts to proofread, errors can occur which can change the meaning. Any change was purely unintentional.    Enid DerryWagner, Nakayla Rorabaugh, PA-C 01/03/17 2230    Dionne BucySiadecki, Sebastian, MD 01/04/17 0020

## 2017-01-03 NOTE — ED Notes (Signed)
Interpretor ordered

## 2017-01-03 NOTE — ED Notes (Signed)
This tech called pt 2x in the flex waiting no answer

## 2017-01-03 NOTE — ED Triage Notes (Signed)
Seen at clinic for uti and placed on cipro - finished antibiotics and states still having chills and urine feels "hot"

## 2017-01-03 NOTE — ED Notes (Signed)
Pt signed in with c/o vaginal infection. Once interpretor arrived found out she meant urinary symptoms. Chief complaint updated. Urine sent.

## 2017-01-03 NOTE — ED Notes (Signed)
Husband comes to desk to see why wife has not been called in. Patient has been waiting in main waiting room. Patient was dismissed by flex due to not being in flex waiting room. Patient restored to wait list and moved to flex wait.

## 2017-01-03 NOTE — Discharge Instructions (Signed)
Please follow-up with primary care provider for diabetes management.

## 2017-01-05 LAB — URINE CULTURE: CULTURE: NO GROWTH

## 2017-06-17 DIAGNOSIS — K859 Acute pancreatitis without necrosis or infection, unspecified: Secondary | ICD-10-CM | POA: Diagnosis present

## 2017-06-27 ENCOUNTER — Other Ambulatory Visit: Payer: Self-pay

## 2017-06-27 DIAGNOSIS — E781 Pure hyperglyceridemia: Secondary | ICD-10-CM | POA: Diagnosis present

## 2017-06-27 DIAGNOSIS — Z880 Allergy status to penicillin: Secondary | ICD-10-CM | POA: Diagnosis not present

## 2017-06-27 DIAGNOSIS — Z23 Encounter for immunization: Secondary | ICD-10-CM | POA: Diagnosis not present

## 2017-06-27 DIAGNOSIS — E1165 Type 2 diabetes mellitus with hyperglycemia: Principal | ICD-10-CM | POA: Diagnosis present

## 2017-06-27 DIAGNOSIS — Z91040 Latex allergy status: Secondary | ICD-10-CM

## 2017-06-27 DIAGNOSIS — Z79899 Other long term (current) drug therapy: Secondary | ICD-10-CM

## 2017-06-27 DIAGNOSIS — Z794 Long term (current) use of insulin: Secondary | ICD-10-CM | POA: Diagnosis not present

## 2017-06-27 DIAGNOSIS — E785 Hyperlipidemia, unspecified: Secondary | ICD-10-CM | POA: Diagnosis present

## 2017-06-27 DIAGNOSIS — K858 Other acute pancreatitis without necrosis or infection: Secondary | ICD-10-CM | POA: Diagnosis present

## 2017-06-27 LAB — COMPREHENSIVE METABOLIC PANEL
ALK PHOS: 69 U/L (ref 38–126)
ALT: 34 U/L (ref 14–54)
ANION GAP: 9 (ref 5–15)
AST: 29 U/L (ref 15–41)
Albumin: 3.9 g/dL (ref 3.5–5.0)
BILIRUBIN TOTAL: 0.3 mg/dL (ref 0.3–1.2)
BUN: 14 mg/dL (ref 6–20)
CALCIUM: 9.3 mg/dL (ref 8.9–10.3)
CO2: 23 mmol/L (ref 22–32)
Chloride: 102 mmol/L (ref 101–111)
Creatinine, Ser: 0.49 mg/dL (ref 0.44–1.00)
GFR calc non Af Amer: >60 mL/min (ref >60)
Glucose, Bld: 332 mg/dL — ABNORMAL HIGH (ref 65–99)
Potassium: 3.8 mmol/L (ref 3.5–5.1)
Sodium: 134 mmol/L — ABNORMAL LOW (ref 135–145)
TOTAL PROTEIN: 8.3 g/dL — AB (ref 6.5–8.1)

## 2017-06-27 LAB — CBC
HEMATOCRIT: 37.7 % (ref 35.0–47.0)
HEMOGLOBIN: 12.6 g/dL (ref 12.0–16.0)
MCH: 29.8 pg (ref 26.0–34.0)
MCHC: 33.4 g/dL (ref 32.0–36.0)
MCV: 89.3 fL (ref 80.0–100.0)
Platelets: 442 10*3/uL — ABNORMAL HIGH (ref 150–440)
RBC: 4.23 MIL/uL (ref 3.80–5.20)
RDW: 12.9 % (ref 11.5–14.5)
WBC: 12 10*3/uL — ABNORMAL HIGH (ref 3.6–11.0)

## 2017-06-27 NOTE — ED Triage Notes (Addendum)
Info obtained via [employer] interpreter, Jesus [phone]).  Patient reports recently discharged from [facility] with diagnosis of pancreatisis.  Reports elevated blood sugars today in 200's.  Patient concerned about the numbers and that she is still having pain.

## 2017-06-28 ENCOUNTER — Encounter: Payer: Self-pay | Admitting: Emergency Medicine

## 2017-06-28 ENCOUNTER — Observation Stay
Admission: EM | Admit: 2017-06-28 | Discharge: 2017-06-29 | DRG: 637 | Disposition: A | Discharge status: Home / Self Care | Payer: BLUE CROSS/BLUE SHIELD | Attending: Internal Medicine | Admitting: Internal Medicine

## 2017-06-28 ENCOUNTER — Other Ambulatory Visit: Payer: Self-pay

## 2017-06-28 DIAGNOSIS — K858 Other acute pancreatitis without necrosis or infection: Secondary | ICD-10-CM

## 2017-06-28 DIAGNOSIS — K859 Acute pancreatitis without necrosis or infection, unspecified: Secondary | ICD-10-CM | POA: Diagnosis present

## 2017-06-28 DIAGNOSIS — R739 Hyperglycemia, unspecified: Secondary | ICD-10-CM

## 2017-06-28 DIAGNOSIS — IMO0002 Reserved for concepts with insufficient information to code with codable children: Secondary | ICD-10-CM | POA: Diagnosis present

## 2017-06-28 HISTORY — DX: Pure hyperglyceridemia: E78.1

## 2017-06-28 HISTORY — DX: Acute pancreatitis without necrosis or infection, unspecified: K85.90

## 2017-06-28 LAB — TSH: TSH: 0.979 u[IU]/mL (ref 0.350–4.500)

## 2017-06-28 LAB — HEMOGLOBIN A1C
Hgb A1c MFr Bld: 8.7 % — ABNORMAL HIGH (ref 4.8–5.6)
Mean Plasma Glucose: 202.99 mg/dL

## 2017-06-28 LAB — GLUCOSE, CAPILLARY
Glucose-Capillary: 259 mg/dL — ABNORMAL HIGH (ref 65–99)
Glucose-Capillary: 240 mg/dL — ABNORMAL HIGH (ref 65–99)
Glucose-Capillary: 125 mg/dL — ABNORMAL HIGH (ref 65–99)
Glucose-Capillary: 153 mg/dL — ABNORMAL HIGH (ref 65–99)
Glucose-Capillary: 121 mg/dL — ABNORMAL HIGH (ref 65–99)

## 2017-06-28 LAB — LIPASE, BLOOD: LIPASE: 74 U/L — AB (ref 11–51)

## 2017-06-28 MED ORDER — PNEUMOCOCCAL VAC POLYVALENT 25 MCG/0.5ML IJ INJ
0.5000 mL | INJECTION | INTRAMUSCULAR | Status: AC
  Administered 2017-06-29: 0.5 mL via INTRAMUSCULAR
  Filled 2017-06-28: qty 0.5

## 2017-06-28 MED ORDER — ONDANSETRON HCL 4 MG/2ML IJ SOLN
4.00 | INTRAMUSCULAR | Status: DC
Start: ? — End: 2017-06-28

## 2017-06-28 MED ORDER — ACETAMINOPHEN 325 MG PO TABS: 650.0000 mg | ORAL_TABLET | Freq: Four times a day (QID) | ORAL | Status: DC | PRN

## 2017-06-28 MED ORDER — INSULIN GLARGINE 100 UNIT/ML ~~LOC~~ SOLN
10.0000 [IU] | Freq: Every day | SUBCUTANEOUS | Status: DC
  Administered 2017-06-28: 21:00:00 10 [IU] via SUBCUTANEOUS
  Filled 2017-06-28 (×2): qty 0.1

## 2017-06-28 MED ORDER — LIDOCAINE HCL (PF) 1 % IJ SOLN
0.50 | INTRAMUSCULAR | Status: DC
Start: ? — End: 2017-06-28

## 2017-06-28 MED ORDER — HYDROMORPHONE HCL 2 MG PO TABS: 2.0000 mg | ORAL_TABLET | Freq: Four times a day (QID) | ORAL | Status: DC | PRN

## 2017-06-28 MED ORDER — DEXTROSE 50 % IV SOLN
12.50 | INTRAVENOUS | Status: DC
Start: ? — End: 2017-06-28

## 2017-06-28 MED ORDER — ATORVASTATIN CALCIUM 20 MG PO TABS
20.0000 mg | ORAL_TABLET | Freq: Every day | ORAL | Status: DC
  Administered 2017-06-28: 20 mg via ORAL
  Filled 2017-06-28: qty 1

## 2017-06-28 MED ORDER — FENOFIBRATE 160 MG PO TABS
160.0000 mg | ORAL_TABLET | Freq: Every day | ORAL | Status: DC
  Administered 2017-06-28 – 2017-06-29 (×2): 160 mg via ORAL
  Filled 2017-06-28 (×2): qty 1

## 2017-06-28 MED ORDER — INSULIN GLARGINE 100 UNIT/ML ~~LOC~~ SOLN
16.00 | SUBCUTANEOUS | Status: DC
Start: 2017-06-26 — End: 2017-06-28

## 2017-06-28 MED ORDER — INSULIN LISPRO 100 UNIT/ML ~~LOC~~ SOLN
0.00 | SUBCUTANEOUS | Status: DC
Start: 2017-06-26 — End: 2017-06-28

## 2017-06-28 MED ORDER — GLUCAGON HCL RDNA (DIAGNOSTIC) 1 MG IJ SOLR
1.00 | INTRAMUSCULAR | Status: DC
Start: ? — End: 2017-06-28

## 2017-06-28 MED ORDER — SODIUM CHLORIDE 0.9 % IV BOLUS
1000.0000 mL | Freq: Once | INTRAVENOUS | Status: AC
  Administered 2017-06-28: 1000 mL via INTRAVENOUS

## 2017-06-28 MED ORDER — HYDROMORPHONE HCL-NACL 25-0.9 MG/25ML-% IV SOSY
0.40 | PREFILLED_SYRINGE | INTRAVENOUS | Status: DC
Start: ? — End: 2017-06-28

## 2017-06-28 MED ORDER — DOCUSATE SODIUM 100 MG PO CAPS
100.0000 mg | ORAL_CAPSULE | Freq: Two times a day (BID) | ORAL | Status: DC
  Administered 2017-06-28 – 2017-06-29 (×3): 100 mg via ORAL
  Filled 2017-06-28 (×3): qty 1

## 2017-06-28 MED ORDER — BISACODYL 5 MG PO TBEC
10.00 | DELAYED_RELEASE_TABLET | ORAL | Status: DC
Start: ? — End: 2017-06-28

## 2017-06-28 MED ORDER — OMEGA-3-ACID ETHYL ESTERS 1 G PO CAPS
1.0000 g | ORAL_CAPSULE | Freq: Every day | ORAL | Status: DC
  Administered 2017-06-28 – 2017-06-29 (×2): 1 g via ORAL
  Filled 2017-06-28 (×2): qty 1

## 2017-06-28 MED ORDER — INSULIN ASPART 100 UNIT/ML ~~LOC~~ SOLN
0.0000 [IU] | Freq: Three times a day (TID) | SUBCUTANEOUS | Status: DC
  Administered 2017-06-28: 18:00:00 2 [IU] via SUBCUTANEOUS
  Administered 2017-06-28: 1 [IU] via SUBCUTANEOUS
  Administered 2017-06-28: 09:00:00 3 [IU] via SUBCUTANEOUS
  Administered 2017-06-29: 13:00:00 2 [IU] via SUBCUTANEOUS
  Filled 2017-06-28 (×4): qty 1

## 2017-06-28 MED ORDER — ONDANSETRON HCL 4 MG PO TABS: 4.0000 mg | ORAL_TABLET | Freq: Four times a day (QID) | ORAL | Status: DC | PRN

## 2017-06-28 MED ORDER — BISACODYL 5 MG PO TBEC: 5.0000 mg | DELAYED_RELEASE_TABLET | Freq: Every day | ORAL | Status: DC | PRN

## 2017-06-28 MED ORDER — ONDANSETRON HCL 4 MG/2ML IJ SOLN
4.0000 mg | Freq: Once | INTRAMUSCULAR | Status: AC
  Administered 2017-06-28: 4 mg via INTRAVENOUS
  Filled 2017-06-28: qty 2

## 2017-06-28 MED ORDER — SODIUM CHLORIDE 0.9 % IV SOLN
INTRAVENOUS | Status: DC
  Administered 2017-06-28 – 2017-06-29 (×4): via INTRAVENOUS

## 2017-06-28 MED ORDER — HYDROMORPHONE HCL 2 MG PO TABS
2.00 | ORAL_TABLET | ORAL | Status: DC
Start: ? — End: 2017-06-28

## 2017-06-28 MED ORDER — POLYETHYLENE GLYCOL 3350 17 G PO PACK
17.00 | PACK | ORAL | Status: DC
Start: 2017-06-27 — End: 2017-06-28

## 2017-06-28 MED ORDER — ENOXAPARIN SODIUM 40 MG/0.4ML ~~LOC~~ SOLN
40.0000 mg | SUBCUTANEOUS | Status: DC
  Administered 2017-06-28: 21:00:00 40 mg via SUBCUTANEOUS
  Filled 2017-06-28: qty 0.4

## 2017-06-28 MED ORDER — OMEGA-3 1000 MG PO CAPS
1.00 | ORAL_CAPSULE | ORAL | Status: DC
Start: 2017-06-27 — End: 2017-06-28

## 2017-06-28 MED ORDER — FENOFIBRATE 145 MG PO TABS
145.00 | ORAL_TABLET | ORAL | Status: DC
Start: 2017-06-27 — End: 2017-06-28

## 2017-06-28 MED ORDER — MORPHINE SULFATE (PF) 2 MG/ML IV SOLN
2.0000 mg | INTRAVENOUS | Status: DC | PRN
  Administered 2017-06-28 (×3): 2 mg via INTRAVENOUS
  Filled 2017-06-28 (×3): qty 1

## 2017-06-28 MED ORDER — INSULIN LISPRO 100 UNIT/ML ~~LOC~~ SOLN
5.00 | SUBCUTANEOUS | Status: DC
Start: 2017-06-26 — End: 2017-06-28

## 2017-06-28 MED ORDER — OMEGA-3 1000 MG PO CAPS: 1.0000 | ORAL_CAPSULE | Freq: Every day | ORAL | Status: DC

## 2017-06-28 MED ORDER — ONDANSETRON HCL 4 MG/2ML IJ SOLN
4.0000 mg | Freq: Four times a day (QID) | INTRAMUSCULAR | Status: DC | PRN
Start: 2017-06-28 — End: 2017-06-29
  Administered 2017-06-28: 21:00:00 4 mg via INTRAVENOUS
  Filled 2017-06-28: qty 2

## 2017-06-28 MED ORDER — PANTOPRAZOLE SODIUM 40 MG PO TBEC
40.0000 mg | DELAYED_RELEASE_TABLET | Freq: Every day | ORAL | Status: DC
  Administered 2017-06-28 – 2017-06-29 (×2): 40 mg via ORAL
  Filled 2017-06-28 (×2): qty 1

## 2017-06-28 MED ORDER — SENNA 8.6 MG PO TABS
2.0000 | ORAL_TABLET | Freq: Two times a day (BID) | ORAL | Status: DC
  Administered 2017-06-28 – 2017-06-29 (×3): 17.2 mg via ORAL
  Filled 2017-06-28 (×3): qty 2

## 2017-06-28 MED ORDER — ENOXAPARIN SODIUM 40 MG/0.4ML ~~LOC~~ SOLN
40.00 | SUBCUTANEOUS | Status: DC
Start: 2017-06-27 — End: 2017-06-28

## 2017-06-28 MED ORDER — ATORVASTATIN CALCIUM 20 MG PO TABS
20.00 | ORAL_TABLET | ORAL | Status: DC
Start: 2017-06-26 — End: 2017-06-28

## 2017-06-28 MED ORDER — GENERIC EXTERNAL MEDICATION
2.00 | Status: DC
Start: 2017-06-26 — End: 2017-06-28

## 2017-06-28 MED ORDER — INSULIN ASPART 100 UNIT/ML ~~LOC~~ SOLN: 0.0000 [IU] | Freq: Every day | SUBCUTANEOUS | Status: DC

## 2017-06-28 MED ORDER — PANTOPRAZOLE SODIUM 40 MG PO TBEC
40.00 | DELAYED_RELEASE_TABLET | ORAL | Status: DC
Start: 2017-06-27 — End: 2017-06-28

## 2017-06-28 MED ORDER — MORPHINE SULFATE (PF) 4 MG/ML IV SOLN
4.0000 mg | Freq: Once | INTRAVENOUS | Status: AC
  Administered 2017-06-28: 4 mg via INTRAVENOUS
  Filled 2017-06-28: qty 1

## 2017-06-28 MED ORDER — ACETAMINOPHEN 650 MG RE SUPP: 650.0000 mg | Freq: Four times a day (QID) | RECTAL | Status: DC | PRN

## 2017-06-28 NOTE — Progress Notes (Signed)
The patient still has abdominal pain 6 out of 10.  No nausea vomiting or diarrhea. She feels hungry and wants to eat. Vital signs are stable.  Physical examination is unremarkable except abdominal tenderness on palpation along the medial and left side. Start clear liquid diet, advance as tolerated,, IV fluid support and follow-up lipase.  I discussed with the patient, her husband and Charity fundraiser.  Time spent about 25 minutes.

## 2017-06-28 NOTE — H&P (Signed)
Sabrina Hanna is an 42 y.o. female.   Chief Complaint: Hyperglycemia HPI: The patient with past medical history of diabetes and hyperlipidemia presents to the emergency department with elevated blood sugars.  The patient was just discharged from [facility] 2 days ago after a 10-day hospitalization for pancreatitis.  Laboratory evaluation here revealed lipase of 74 which is greater than her discharge lipase from [facility].  Patient states that she has not been able to eat well but she denies nausea or vomiting.  Due to abdominal pain and hyperglycemia the emergency department staff called the hospitalist service for admission.  Past Medical History:  Diagnosis Date  . Diabetes mellitus without complication ([place])   . Hypertriglyceridemia   . Pancreatitis     Past Surgical History:  Procedure Laterality Date  . ABDOMINAL HYSTERECTOMY    . LAPAROSCOPIC UNILATERAL SALPINGO OOPHERECTOMY Right     Family History  Problem Relation Age of Onset  . Diabetes Mellitus II Maternal Grandmother   . Diabetes Mellitus II Sister    Social History:  reports that she has never smoked. She has never used smokeless tobacco. She reports that she does not drink alcohol or use drugs.  Allergies:  Allergies  Allergen Reactions  . Latex Rash  . Penicillins Rash and Other (See Comments)    Has patient had a PCN reaction causing immediate rash, facial/tongue/throat swelling, SOB or lightheadedness with hypotension: No Has patient had a PCN reaction causing severe rash involving mucus membranes or skin necrosis: No Has patient had a PCN reaction that required hospitalization No Has patient had a PCN reaction occurring within the last 10 years: No If all of the above answers are "NO", then may proceed with Cephalosporin use.     Medications Prior to Admission  Medication Sig Dispense Refill  . atorvastatin (LIPITOR) 20 MG tablet Take 20 mg by mouth at bedtime.  0  . bisacodyl  (DULCOLAX) 5 MG EC tablet Take 2 tablets by mouth daily as needed for constipation.    . fenofibrate (TRICOR) 145 MG tablet Take 145 mg by mouth daily.  0  . HUMALOG 100 UNIT/ML injection Inject 6 Units into the skin 3 (three) times daily.  0  . HYDROmorphone (DILAUDID) 2 MG tablet Take 1-2 tablets by mouth every 6 (six) hours as needed for severe pain.  0  . LANTUS 100 UNIT/ML injection Inject 16 Units into the skin at bedtime.  0  . Omega-3 1000 MG CAPS Take 1 capsule by mouth daily.    . pantoprazole (PROTONIX) 40 MG tablet Take 40 mg by mouth daily.  0  . senna (SENOKOT) 8.6 MG TABS tablet Take 2 tablets by mouth 2 (two) times daily.     Marland Kitchen sulfamethoxazole-trimethoprim (BACTRIM DS,SEPTRA DS) 800-160 MG tablet Take 1 tablet 2 (two) times daily by mouth. (Patient not taking: Reported on 06/28/2017) 20 tablet 0    Results for orders placed or performed during the hospital encounter of 06/28/17 (from the past 48 hour(s))  CBC     Status: Abnormal   Collection Time: 06/27/17 10:11 PM  Result Value Ref Range   WBC 12.0 (H) 3.6 - 11.0 K/uL   RBC 4.23 3.80 - 5.20 MIL/uL   Hemoglobin 12.6 12.0 - 16.0 g/dL   HCT 37.7 35.0 - 47.0 %   MCV 89.3 80.0 - 100.0 fL   MCH 29.8 26.0 - 34.0 pg   MCHC 33.4 32.0 - 36.0 g/dL   RDW 12.9 11.5 - 14.5 %  Platelets 442 (H) 150 - 440 K/uL    Comment: Performed at [facility], [address] [place], [place] [address]  Comprehensive metabolic panel     Status: Abnormal   Collection Time: 06/27/17 10:11 PM  Result Value Ref Range   Sodium 134 (L) 135 - 145 mmol/L   Potassium 3.8 3.5 - 5.1 mmol/L   Chloride 102 101 - 111 mmol/L   CO2 23 22 - 32 mmol/L   Glucose, Bld 332 (H) 65 - 99 mg/dL   BUN 14 6 - 20 mg/dL   Creatinine, Ser 0.49 0.44 - 1.00 mg/dL   Calcium 9.3 8.9 - 10.3 mg/dL   Total Protein 8.3 (H) 6.5 - 8.1 g/dL   Albumin 3.9 3.5 - 5.0 g/dL   AST 29 15 - 41 U/L   ALT 34 14 - 54 U/L   Alkaline Phosphatase 69 38 - 126 U/L   Total Bilirubin  0.3 0.3 - 1.2 mg/dL   GFR calc non Af Amer >60 >60 mL/min   GFR calc Af Amer >60 >60 mL/min    Comment: (NOTE) The eGFR has been calculated using the CKD EPI equation. This calculation has not been validated in all clinical situations. eGFR's persistently <60 mL/min signify possible Chronic Kidney Disease.    Anion gap 9 5 - 15    Comment: Performed at [facility], [address] [place], [place] [address]  Lipase, blood     Status: Abnormal   Collection Time: 06/27/17 10:11 PM  Result Value Ref Range   Lipase 74 (H) 11 - 51 U/L    Comment: Performed at [facility], [address] [place], [place] [address]  Glucose, capillary     Status: Abnormal   Collection Time: 06/28/17  6:11 AM  Result Value Ref Range   Glucose-Capillary 259 (H) 65 - 99 mg/dL   No results found.  Review of Systems  Constitutional: Negative for chills and fever.  HENT: Negative for sore throat and tinnitus.   Eyes: Negative for blurred vision and redness.  Respiratory: Negative for cough and shortness of breath.   Cardiovascular: Negative for chest pain, palpitations, orthopnea and PND.  Gastrointestinal: Positive for abdominal pain. Negative for diarrhea, nausea and vomiting.  Genitourinary: Negative for dysuria, frequency and urgency.  Musculoskeletal: Negative for joint pain and myalgias.  Skin: Negative for rash.       No lesions  Neurological: Negative for speech change, focal weakness and weakness.  Endo/Heme/Allergies: Does not bruise/bleed easily.       No temperature intolerance  Psychiatric/Behavioral: Negative for depression and suicidal ideas.    Blood pressure 116/79, pulse 62, temperature (!) 97.5 F (36.4 C), temperature source Oral, resp. rate 18, height _0  (1.575 m), weight 69.9 kg (154 lb 1.6 oz), SpO2 100 %. Physical Exam  Vitals reviewed. Constitutional: She is oriented to person, place, and time. She appears well-developed and well-nourished. No  distress.  HENT:  Head: Normocephalic and atraumatic.  Mouth/Throat: Oropharynx is clear and moist.  Eyes: Pupils are equal, round, and reactive to light. Conjunctivae and EOM are normal. No scleral icterus.  Neck: Normal range of motion. Neck supple. No JVD present. No tracheal deviation present. No thyromegaly present.  Cardiovascular: Normal rate, regular rhythm and normal heart sounds. Exam reveals no gallop and no friction rub.  No murmur heard. Respiratory: Effort normal and breath sounds normal.  GI: Soft. Bowel sounds are normal. She exhibits no distension and no mass. There is  tenderness. There is no rebound and no guarding.  Genitourinary:  Genitourinary Comments: Deferred  Musculoskeletal: Normal range of motion. She exhibits no edema.  Lymphadenopathy:    She has no cervical adenopathy.  Neurological: She is alert and oriented to person, place, and time. No cranial nerve deficit. She exhibits normal muscle tone.  Skin: Skin is warm and dry. No rash noted. No erythema.  Psychiatric: She has a normal mood and affect. Her behavior is normal. Judgment and thought content normal.     Assessment/Plan This is a 42 year old female admitted for pancreatitis. 1.  Pancreatitis: The patient is now n.p.o.  Hydrate with intravenous fluid.  Manage pain.  IV morphine as needed for severe pain.  Advance diet as tolerated.  Check lipids. 2.  Diabetes mellitus type 2: Continue basal insulin adjusted for hospital diet/n.p.o. status.  Continue sliding scale insulin while hospitalized 3.  Hyperlipidemia: Continue vibrate and statin therapy 4.  DVT prophylaxis: Lovenox 5.  GI prophylaxis: Pantoprazole per home regimen The patient is full code.  Time spent on initial exam patient care approximately 45 minutes  Harrie Foreman, MD 06/28/2017, 6:35 AM

## 2017-06-28 NOTE — ED Notes (Signed)
Husband up to desk to inquire about taking her home; he says pt was just discharged from [facility] after staying 9 days for hyperglycemia; husband highly encouraged to stay to see provider if blood sugar is still running high-he said last check at home was 377

## 2017-06-28 NOTE — Plan of Care (Signed)
C/o L abd pain x2 during the shift. Morphine given twice with improvement. Denies n/v. Tolerates clear liquid diet.

## 2017-06-28 NOTE — ED Provider Notes (Signed)
[facility] Emergency Department Provider Note   ____________________________________________   First MD Initiated Contact with Patient 06/28/17 [id number]     (approximate)  I have reviewed the triage vital signs and the nursing notes.   HISTORY  Chief Complaint Hyperglycemia    HPI Sabrina Hanna is a 42 y.o. female who comes into the hospital today with some elevated blood sugars.  She reports that her sugars have been up to 333.  She said they have been high since last Wednesday.  She was admitted to [facility] a few weeks ago with pancreatitis and elevated cholesterol and blood sugars.  She states that she has been in the hospital for 2 weeks and was discharged yesterday.  She was given pain medications but states that the medicines have not been helping.  As the patient has tried to eat it has not gotten any better.  The patient states that she has been eating some chicken soup but not a whole lot.  She reports that her pancreas is still inflamed and rates her pain a 7 out of 10 in intensity.  She states that she is also been taking her blood sugar medicine but has not been bringing down her sugars.  The patient sugars were in the 200s this morning and 300 this evening.  The patient is also having a lot of pain.  She denies any nausea or vomiting.  She is here today for evaluation.  Past Medical History:  Diagnosis Date  . Diabetes mellitus without complication (HCC)   . Hypertriglyceridemia   . Pancreatitis     Patient Active Problem List   Diagnosis Date Noted  . Pancreatitis 06/28/2017  . 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 Intentional metformin overdose (HCC) 04/08/2016  . Diabetes mellitus without complication (HCC) 04/08/2016    Past Surgical History:  Procedure Laterality Date  . ABDOMINAL HYSTERECTOMY    . LAPAROSCOPIC UNILATERAL SALPINGO OOPHERECTOMY Right     Prior to Admission medications     Medication Sig Start Date End Date Taking? Authorizing Provider  atorvastatin (LIPITOR) 20 MG tablet Take 20 mg by mouth at bedtime. 06/26/17  Yes [provider]  bisacodyl (DULCOLAX) 5 MG EC tablet Take 2 tablets by mouth daily as needed for constipation. 06/26/17 07/26/17 Yes [provider]  fenofibrate (TRICOR) 145 MG tablet Take 145 mg by mouth daily. 06/26/17  Yes [provider]  HUMALOG 100 UNIT/ML injection Inject 6 Units into the skin 3 (three) times daily. 06/26/17  Yes [provider]  HYDROmorphone (DILAUDID) 2 MG tablet Take 1-2 tablets by mouth every 6 (six) hours as needed for severe pain. 06/26/17  Yes [provider]  LANTUS 100 UNIT/ML injection Inject 16 Units into the skin at bedtime. 06/26/17  Yes [provider]  Omega-3 1000 MG CAPS Take 1 capsule by mouth daily. 06/27/17 06/27/18 Yes [provider]  pantoprazole (PROTONIX) 40 MG tablet Take 40 mg by mouth daily. 04/10/17  Yes [provider]  senna (SENOKOT) 8.6 MG TABS tablet Take 2 tablets by mouth 2 (two) times daily.    Yes [provider]  sulfamethoxazole-trimethoprim (BACTRIM DS,SEPTRA DS) 800-160 MG tablet Take 1 tablet 2 (two) times daily by mouth. Patient not taking: Reported on 06/28/2017 01/03/17   Enid Derry, PA-C    Allergies Penicillins  No family history on file.  Social History Social History   Tobacco Use  . Smoking status: Never Smoker  .  Smokeless tobacco: Never Used  Substance Use Topics  . Alcohol use: No  . Drug use: No    Review of Systems  Constitutional: No fever/chills Eyes: No visual changes. ENT: No sore throat. Cardiovascular: Denies chest pain. Respiratory: Denies shortness of breath. Gastrointestinal:  abdominal pain.  No nausea, no vomiting.  No diarrhea.  No constipation. Genitourinary: Negative for dysuria. Musculoskeletal: Negative for back pain. Skin: Negative for rash. Neurological: Negative for  headaches, focal weakness or numbness.   ____________________________________________   PHYSICAL EXAM:  VITAL SIGNS: ED Triage Vitals  Enc Vitals Group     BP 06/27/17 2204 113/74     Pulse Rate 06/27/17 2204 79     Resp 06/27/17 2204 18     Temp 06/27/17 2204 98.4 F (36.9 C)     Temp Source 06/27/17 2204 Oral     SpO2 06/27/17 2204 98 %     Weight 06/27/17 2201 168 lb (76.2 kg)     Height 06/27/17 2201  (1.575 m)     Head Circumference --      Peak Flow --      Pain Score 06/27/17 2201 5     Pain Loc --      Pain Edu? --      Excl. in GC? --     Constitutional: Alert and oriented. Well appearing and in moderate distress. Eyes: Conjunctivae are normal. PERRL. EOMI. Head: Atraumatic. Nose: No congestion/rhinnorhea. Mouth/Throat: Mucous membranes are moist.  Oropharynx non-erythematous. Cardiovascular: Normal rate, regular rhythm. Grossly normal heart sounds.  Good peripheral circulation. Respiratory: Normal respiratory effort.  No retractions. Lungs CTAB. Gastrointestinal: Soft with epigastric tenderness to palpation. No distention. Positive bowel sounds  Musculoskeletal: No lower extremity tenderness nor edema.   Neurologic:  Normal speech and language.  Skin:  Skin is warm, dry and intact.  Psychiatric: Mood and affect are normal.  ____________________________________________   LABS (all labs ordered are listed, but only abnormal results are displayed)  Labs Reviewed  CBC - Abnormal; Notable for the following components:      Result Value   WBC 12.0 (*)    Platelets 442 (*)    All other components within normal limits  COMPREHENSIVE METABOLIC PANEL - Abnormal; Notable for the following components:   Sodium 134 (*)    Glucose, Bld 332 (*)    Total Protein 8.3 (*)    All other components within normal limits  LIPASE, BLOOD - Abnormal; Notable for the following components:   Lipase 74 (*)    All other components within normal limits  URINALYSIS, COMPLETE  (UACMP) WITH MICROSCOPIC   ____________________________________________  EKG  none ____________________________________________  RADIOLOGY  ED MD interpretation:  none  Official radiology report(s): No results found.  ____________________________________________   PROCEDURES  Procedure(s) performed: None  Procedures  Critical Care performed: No  ____________________________________________   INITIAL IMPRESSION / ASSESSMENT AND PLAN / ED COURSE  As part of my medical decision making, I reviewed the following data within the electronic MEDICAL RECORD NUMBER Notes from prior ED visits and [place] Controlled Substance Database   This is a 42 year old female who comes into the hospital today with some elevated blood sugars and abdominal pain.  The patient was recently admitted to [facility] and diagnosed with pancreatitis.  Approximately 3 days ago the patient had a repeat CT scan according to the notes from [facility] and it appeared that her pancreatitis was worse.  The patient's initial lipase at [facility] was 54.  Her initial  triglyceride level was over 7000.  I am concerned that the patient may have persistent pancreatitis causing her pain and elevate her blood sugars.  I will give the patient a liter of normal saline a dose of morphine and some Zofran.  We did check a CBC CMP and a lipase.  The patient's white blood cell count is 12.0 and her lipase is 74.  The patient's glucose is 332.  I will readmit her to the hospital as she is concerned that she cannot manage the pain at home.  The patient will be admitted for her pancreatitis and her hyperglycemia.      ____________________________________________   FINAL CLINICAL IMPRESSION(S) / ED DIAGNOSES  Final diagnoses:  Hyperglycemia  Other acute pancreatitis, unspecified complication status     ED Discharge Orders    None       Note:  This document was prepared using Dragon voice recognition software and may include unintentional  dictation errors.    Rebecka Apley, MD 06/28/17 [phone]

## 2017-06-28 NOTE — ED Notes (Signed)
Transport to room 123.AS

## 2017-06-28 NOTE — ED Notes (Signed)
Noel RN, aware of bed assigned  

## 2017-06-28 NOTE — ED Notes (Signed)
Family to stat desk asking about wait time. Family given update. Family verbalizes understanding.  

## 2017-06-29 LAB — GLUCOSE, CAPILLARY
Glucose-Capillary: 106 mg/dL — ABNORMAL HIGH (ref 65–99)
Glucose-Capillary: 199 mg/dL — ABNORMAL HIGH (ref 65–99)

## 2017-06-29 LAB — LIPASE, BLOOD: Lipase: 38 U/L (ref 11–51)

## 2017-06-29 NOTE — Discharge Summary (Signed)
[facility] [place] at [facility]   PATIENT NAME: Sabrina Hanna    MR#:  [id number]  DATE OF BIRTH:  [birth year]  DATE OF ADMISSION:  06/28/2017   ADMITTING PHYSICIAN: Arnaldo Natal, MD  DATE OF DISCHARGE: 06/29/2017  PRIMARY CARE PHYSICIAN: [facility], Gavin Potters   ADMISSION DIAGNOSIS:  Hyperglycemia [R73.9] Other acute pancreatitis, unspecified complication status [K85.80] DISCHARGE DIAGNOSIS:  Active Problems:   Pancreatitis  SECONDARY DIAGNOSIS:   Past Medical History:  Diagnosis Date  . Diabetes mellitus without complication (HCC)   . Hypertriglyceridemia   . Pancreatitis    HOSPITAL COURSE:   This is a 42 year old female admitted for pancreatitis. 1.  Pancreatitis:  The patient was kept n.p.o. with IV fluid support.  She tolerated clear liquid diet, full liquid diet and low-fat diet today.  She received Dilaudid p.o. and IV morphine as needed for severe pain. 2.  Diabetes mellitus type 2: Continue basal insulin and sliding scale insulin. 3.  Hyperlipidemia: Continue vibrate and statin therapy DISCHARGE CONDITIONS:  Stable, discharge to home today. CONSULTS OBTAINED:  Treatment Team:  Shaune Pollack, MD DRUG ALLERGIES:   Allergies  Allergen Reactions  . Latex Rash  . Penicillins Rash and Other (See Comments)    Has patient had a PCN reaction causing immediate rash, facial/tongue/throat swelling, SOB or lightheadedness with hypotension: No Has patient had a PCN reaction causing severe rash involving mucus membranes or skin necrosis: No Has patient had a PCN reaction that required hospitalization No Has patient had a PCN reaction occurring within the last 10 years: No If all of the above answers are "NO", then may proceed with Cephalosporin use.    DISCHARGE MEDICATIONS:   Allergies as of 06/29/2017      Reactions   Latex Rash   Penicillins Rash, Other (See Comments)   Has patient had a PCN reaction causing immediate rash,  facial/tongue/throat swelling, SOB or lightheadedness with hypotension: No Has patient had a PCN reaction causing severe rash involving mucus membranes or skin necrosis: No Has patient had a PCN reaction that required hospitalization No Has patient had a PCN reaction occurring within the last 10 years: No If all of the above answers are "NO", then may proceed with Cephalosporin use.      Medication List    STOP taking these medications   sulfamethoxazole-trimethoprim 800-160 MG tablet Commonly known as:  BACTRIM DS,SEPTRA DS     TAKE these medications   atorvastatin 20 MG tablet Commonly known as:  LIPITOR Take 20 mg by mouth at bedtime.   bisacodyl 5 MG EC tablet Commonly known as:  DULCOLAX Take 2 tablets by mouth daily as needed for constipation.   fenofibrate 145 MG tablet Commonly known as:  TRICOR Take 145 mg by mouth daily.   HUMALOG 100 UNIT/ML injection Generic drug:  insulin lispro Inject 6 Units into the skin 3 (three) times daily.   HYDROmorphone 2 MG tablet Commonly known as:  DILAUDID Take 1-2 tablets by mouth every 6 (six) hours as needed for severe pain.   LANTUS 100 UNIT/ML injection Generic drug:  insulin glargine Inject 16 Units into the skin at bedtime.   Omega-3 1000 MG Caps Take 1 capsule by mouth daily.   pantoprazole 40 MG tablet Commonly known as:  PROTONIX Take 40 mg by mouth daily.   senna 8.6 MG Tabs tablet Commonly known as:  SENOKOT Take 2 tablets by mouth 2 (two) times daily.        DISCHARGE  INSTRUCTIONS:  See AVS.  If you experience worsening of your admission symptoms, develop shortness of breath, life threatening emergency, suicidal or homicidal thoughts you must seek medical attention immediately by calling 911 or calling your MD immediately  if symptoms less severe.  You Must read complete instructions/literature along with all the possible adverse reactions/side effects for all the Medicines you take and that have been  prescribed to you. Take any new Medicines after you have completely understood and accpet all the possible adverse reactions/side effects.   Please note  You were cared for by a hospitalist during your hospital stay. If you have any questions about your discharge medications or the care you received while you were in the hospital after you are discharged, you can call the unit and asked to speak with the hospitalist on call if the hospitalist that took care of you is not available. Once you are discharged, your primary care physician will handle any further medical issues. Please note that NO REFILLS for any discharge medications will be authorized once you are discharged, as it is imperative that you return to your primary care physician (or establish a relationship with a primary care physician if you do not have one) for your aftercare needs so that they can reassess your need for medications and monitor your lab values.    On the day of Discharge:  VITAL SIGNS:  Blood pressure 110/78, pulse (!) 59, temperature 98.3 F (36.8 C), temperature source Oral, resp. rate 18, height  (1.575 m), weight 153 lb 11.2 oz (69.7 kg), SpO2 99 %. PHYSICAL EXAMINATION:  GENERAL:  42 y.o.-year-old patient lying in the bed with no acute distress.  EYES: Pupils equal, round, reactive to light and accommodation. No scleral icterus. Extraocular muscles intact.  HEENT: Head atraumatic, normocephalic. Oropharynx and nasopharynx clear.  NECK:  Supple, no jugular venous distention. No thyroid enlargement, no tenderness.  LUNGS: Normal breath sounds bilaterally, no wheezing, rales,rhonchi or crepitation. No use of accessory muscles of respiration.  CARDIOVASCULAR: S1, S2 normal. No murmurs, rubs, or gallops.  ABDOMEN: Soft, mild tenderness in the middle part, non-distended. Bowel sounds present. No organomegaly or mass.  EXTREMITIES: No pedal edema, cyanosis, or clubbing.  NEUROLOGIC: Cranial nerves II through XII  are intact. Muscle strength 5/5 in all extremities. Sensation intact. Gait not checked.  PSYCHIATRIC: The patient is alert and oriented x 3.  SKIN: No obvious rash, lesion, or ulcer.  DATA REVIEW:   CBC Recent Labs  Lab 06/27/17 2211  WBC 12.0*  HGB 12.6  HCT 37.7  PLT 442*    Chemistries  Recent Labs  Lab 06/27/17 2211  NA 134*  K 3.8  CL 102  CO2 23  GLUCOSE 332*  BUN 14  CREATININE 0.49  CALCIUM 9.3  AST 29  ALT 34  ALKPHOS 69  BILITOT 0.3     Microbiology Results  Results for orders placed or performed during the hospital encounter of 01/03/17  Urine Culture     Status: None   Collection Time: 01/03/17  1:36 PM  Result Value Ref Range Status   Specimen Description URINE, RANDOM  Final   Special Requests NONE  Final   Culture   Final    NO GROWTH Performed at [facility], [address], [place], [place] [address]    Report Status 01/05/2017 FINAL  Final    RADIOLOGY:  No results found.   Management plans discussed with the patient, family and they are in  agreement.  CODE STATUS: Full Code   TOTAL TIME TAKING CARE OF THIS PATIENT: 35 minutes.    Shaune Pollack M.D on 06/29/2017 at 3:00 PM  Between 7am to 6pm - Pager - [phone]  After 6pm go to [email or web address] - Social research officer, government  [facility]  Office  [phone]  CC: Primary care physician; Raynelle Bring   Note: This dictation was prepared with Dragon dictation along with smaller phrase technology. Any transcriptional errors that result from this process are unintentional.

## 2017-06-29 NOTE — Progress Notes (Signed)
[facility] [place] at [facility] was admitted to the Hospital on 06/28/2017 and Discharged  06/29/2017 and should be excused from work/school   for 4  days starting 06/28/2017 , may return to work/school without any restrictions.  Shaune Pollack M.D on 06/29/2017,at 3:59 PM  [facility] [place] at [facility]  [phone]

## 2017-06-29 NOTE — Discharge Instructions (Signed)
Low fat diet

## 2017-06-29 NOTE — Progress Notes (Signed)
Pt has been discharged home. Discharge papers given and explained to pt, verbalized understanding. Hiram, Interpreter utilized. Meds and f/u appointment reviewed. No RX at this time. Escorted in a wheelchair.

## 2017-06-30 LAB — HIV ANTIBODY (ROUTINE TESTING W REFLEX): HIV Screen 4th Generation wRfx: NONREACTIVE

## 2017-09-04 ENCOUNTER — Other Ambulatory Visit: Payer: Self-pay

## 2017-09-04 ENCOUNTER — Encounter: Payer: Self-pay | Admitting: Emergency Medicine

## 2017-09-04 ENCOUNTER — Emergency Department
Admission: EM | Admit: 2017-09-04 | Discharge: 2017-09-04 | Disposition: A | Discharge status: Home / Self Care | Payer: BLUE CROSS/BLUE SHIELD | Attending: Emergency Medicine | Admitting: Emergency Medicine

## 2017-09-04 DIAGNOSIS — Z79899 Other long term (current) drug therapy: Secondary | ICD-10-CM | POA: Insufficient documentation

## 2017-09-04 DIAGNOSIS — E119 Type 2 diabetes mellitus without complications: Secondary | ICD-10-CM | POA: Diagnosis not present

## 2017-09-04 DIAGNOSIS — L03211 Cellulitis of face: Secondary | ICD-10-CM | POA: Diagnosis not present

## 2017-09-04 DIAGNOSIS — Z794 Long term (current) use of insulin: Secondary | ICD-10-CM | POA: Diagnosis not present

## 2017-09-04 DIAGNOSIS — R220 Localized swelling, mass and lump, head: Secondary | ICD-10-CM | POA: Diagnosis present

## 2017-09-04 MED ORDER — SULFAMETHOXAZOLE-TRIMETHOPRIM 800-160 MG PO TABS: 1.0000 | ORAL_TABLET | Freq: Two times a day (BID) | ORAL | 0 refills | Status: AC

## 2017-09-04 NOTE — ED Notes (Addendum)
See triage note. Pt c/o pustule forming to LEFT lower face/jaw x6 days. States she "popped" site earlier this week and drained some pus but area has worsened today and yesterday.

## 2017-09-04 NOTE — ED Triage Notes (Signed)
Pt in via POV with complaints of unknown insect bite last Wednesday w/ abscess like area forming since then.  Vitals WDL.  NAD noted at this time.

## 2017-09-04 NOTE — ED Notes (Signed)
No peripheral IV placed this visit.   Discharge instructions reviewed with patient. Questions fielded by this RN. Patient verbalizes understanding of instructions. Patient discharged home in stable condition per BramanJackie, [place]. No acute distress noted at time of discharge.   Pt discharged after spouse c/o of "not seeing anybody yet and having been here for three hours." pt oriented to 87 minutes since check-in and pt family member reports "we're just hungry"

## 2017-09-04 NOTE — ED Provider Notes (Signed)
[facility] Emergency Department Provider Note  ____________________________________________  Time seen: Approximately 8:23 PM  I have reviewed the triage vital signs and the nursing notes.   HISTORY  Chief Complaint Insect Bite    HPI Sabrina Hanna is a 42 y.o. female presents to the emergency department with left lower jaw cellulitis surrounding a region of folliculitis that became apparent approximately 5 days ago.  Patient reports that she has attempted manual expression at affected region and area appears to be worsening.  She denies fever and chills.  No alleviating measures have nobeen attempted.   Past Medical History:  Diagnosis Date  . Diabetes mellitus without complication (HCC)   . Hypertriglyceridemia   . Pancreatitis     Patient Active Problem List   Diagnosis Date Noted  . Pancreatitis 06/28/2017  . 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 Intentional metformin overdose (HCC) 04/08/2016  . Diabetes mellitus without complication (HCC) 04/08/2016    Past Surgical History:  Procedure Laterality Date  . ABDOMINAL HYSTERECTOMY    . LAPAROSCOPIC UNILATERAL SALPINGO OOPHERECTOMY Right     Prior to Admission medications   Medication Sig Start Date End Date Taking? Authorizing Provider  atorvastatin (LIPITOR) 20 MG tablet Take 20 mg by mouth at bedtime. 06/26/17   [provider]  fenofibrate (TRICOR) 145 MG tablet Take 145 mg by mouth daily. 06/26/17   [provider]  HUMALOG 100 UNIT/ML injection Inject 6 Units into the skin 3 (three) times daily. 06/26/17   [provider]  HYDROmorphone (DILAUDID) 2 MG tablet Take 1-2 tablets by mouth every 6 (six) hours as needed for severe pain. 06/26/17   [provider]  LANTUS 100 UNIT/ML injection Inject 16 Units into the skin at bedtime. 06/26/17   [provider]  Omega-3 1000 MG CAPS Take 1 capsule by mouth daily.  06/27/17 06/27/18  [provider]  pantoprazole (PROTONIX) 40 MG tablet Take 40 mg by mouth daily. 04/10/17   [provider]  senna (SENOKOT) 8.6 MG TABS tablet Take 2 tablets by mouth 2 (two) times daily.     [provider]  sulfamethoxazole-trimethoprim (BACTRIM DS,SEPTRA DS) 800-160 MG tablet Take 1 tablet by mouth 2 (two) times daily for 7 days. 09/04/17 09/11/17  Orvil Feil, PA-C    Allergies Latex and Penicillins  Family History  Problem Relation Age of Onset  . Diabetes Mellitus II Maternal Grandmother   . Diabetes Mellitus II Sister     Social History Social History   Tobacco Use  . Smoking status: Never Smoker  . Smokeless tobacco: Never Used  Substance Use Topics  . Alcohol use: No  . Drug use: No     Review of Systems  Constitutional: No fever/chills Eyes: No visual changes. No discharge ENT: No upper respiratory complaints. Cardiovascular: no chest pain. Respiratory: no cough. No SOB. Gastrointestinal: No abdominal pain.  No nausea, no vomiting.  No diarrhea.  No constipation. Musculoskeletal: Negative for musculoskeletal pain. Skin: Patient has cellulitis surrounding folliculitis. Neurological: Negative for headaches, focal weakness or numbness.  ____________________________________________   PHYSICAL EXAM:  VITAL SIGNS: ED Triage Vitals  Enc Vitals Group     BP 09/04/17 1856 113/75     Pulse Rate 09/04/17 1856 79     Resp 09/04/17 1856 16     Temp 09/04/17 1856 98.2 F (36.8 C)     Temp Source 09/04/17 1856 Oral     SpO2 09/04/17 1856 98 %  Weight 09/04/17 1858 150 lb (68 kg)     Height 09/04/17 1858 5\' 5"  (1.651 m)     Head Circumference --      Peak Flow --      Pain Score 09/04/17 1858 6     Pain Loc --      Pain Edu? --      Excl. in GC? --      Constitutional: Alert and oriented. Well appearing and in no acute distress. Eyes: Conjunctivae are normal. PERRL. EOMI. Head: Atraumatic. Cardiovascular:  Normal rate, regular rhythm. Normal S1 and S2.  Good peripheral circulation. Respiratory: Normal respiratory effort without tachypnea or retractions. Lungs CTAB. Good air entry to the bases with no decreased or absent breath sounds. Musculoskeletal: Full range of motion to all extremities. No gross deformities appreciated. Neurologic:  Normal speech and language. No gross focal neurologic deficits are appreciated.  Skin: Patient has 2 cm of circumferential cellulitis surrounding region of folliculitis at left lower jaw. Psychiatric: Mood and affect are normal. Speech and behavior are normal. Patient exhibits appropriate insight and judgement.   ____________________________________________   LABS (all labs ordered are listed, but only abnormal results are displayed)  Labs Reviewed - No data to display ____________________________________________  EKG   ____________________________________________  RADIOLOGY  No results found.  ____________________________________________    PROCEDURES  Procedure(s) performed:    Procedures    Medications - No data to display   ____________________________________________   INITIAL IMPRESSION / ASSESSMENT AND PLAN / ED COURSE  Pertinent labs & imaging results that were available during my care of the patient were reviewed by me and considered in my medical decision making (see chart for details).  Review of the [place] CSRS was performed in accordance of the [facility] prior to dispensing any controlled drugs.      Assessment and plan Facial cellulitis Patient presents to the emergency department with 2 cm of left lower jaw cellulitis surrounding a region of folliculitis that became apparent 5 days ago and appears to be worsening.  Patient was treated empirically with Bactrim given allergy to penicillin.  Patient was advised to follow-up with primary care as needed.  All patient questions were  answered.    ____________________________________________  FINAL CLINICAL IMPRESSION(S) / ED DIAGNOSES  Final diagnoses:  Facial cellulitis      NEW MEDICATIONS STARTED DURING THIS VISIT:  ED Discharge Orders        Ordered    sulfamethoxazole-trimethoprim (BACTRIM DS,SEPTRA DS) 800-160 MG tablet  2 times daily     09/04/17 2020          This chart was dictated using voice recognition software/Dragon. Despite best efforts to proofread, errors can occur which can change the meaning. Any change was purely unintentional.    Gasper LloydWoods, Esraa Seres M, PA-C 09/04/17 2032    Loleta RoseForbach, Cory, MD 09/04/17 2105

## 2018-03-03 ENCOUNTER — Other Ambulatory Visit: Payer: Self-pay

## 2018-03-03 DIAGNOSIS — Z760 Encounter for issue of repeat prescription: Secondary | ICD-10-CM | POA: Diagnosis not present

## 2018-03-03 DIAGNOSIS — Z9119 Patient's noncompliance with other medical treatment and regimen: Secondary | ICD-10-CM | POA: Insufficient documentation

## 2018-03-03 DIAGNOSIS — E1165 Type 2 diabetes mellitus with hyperglycemia: Secondary | ICD-10-CM | POA: Insufficient documentation

## 2018-03-03 DIAGNOSIS — Z794 Long term (current) use of insulin: Secondary | ICD-10-CM | POA: Insufficient documentation

## 2018-03-03 DIAGNOSIS — Z79899 Other long term (current) drug therapy: Secondary | ICD-10-CM | POA: Diagnosis not present

## 2018-03-03 DIAGNOSIS — R531 Weakness: Secondary | ICD-10-CM | POA: Diagnosis present

## 2018-03-03 LAB — URINALYSIS, COMPLETE (UACMP) WITH MICROSCOPIC
Bilirubin Urine: NEGATIVE
Glucose, UA: >=500 mg/dL — AB
Hgb urine dipstick: NEGATIVE
KETONES UR: 5 mg/dL — AB
Leukocytes, UA: NEGATIVE
Nitrite: NEGATIVE
Protein, ur: NEGATIVE mg/dL
Specific Gravity, Urine: 1.033 — ABNORMAL HIGH (ref 1.005–1.030)
pH: 6 (ref 5.0–8.0)

## 2018-03-03 LAB — COMPREHENSIVE METABOLIC PANEL
ALK PHOS: 62 U/L (ref 38–126)
ALT: 58 U/L — ABNORMAL HIGH (ref 0–44)
AST: 53 U/L — ABNORMAL HIGH (ref 15–41)
Albumin: 4.1 g/dL (ref 3.5–5.0)
Anion gap: 11 (ref 5–15)
BUN: 13 mg/dL (ref 6–20)
CALCIUM: 9.3 mg/dL (ref 8.9–10.3)
CO2: 22 mmol/L (ref 22–32)
Chloride: 101 mmol/L (ref 98–111)
Creatinine, Ser: 0.57 mg/dL (ref 0.44–1.00)
GFR calc Af Amer: >60 mL/min (ref >60)
GFR calc non Af Amer: >60 mL/min (ref >60)
GLUCOSE: 282 mg/dL — AB (ref 70–99)
Potassium: 3.7 mmol/L (ref 3.5–5.1)
Sodium: 134 mmol/L — ABNORMAL LOW (ref 135–145)
Total Bilirubin: 0.4 mg/dL (ref 0.3–1.2)
Total Protein: 7.7 g/dL (ref 6.5–8.1)

## 2018-03-03 LAB — TROPONIN I: Troponin I: <0.03 ng/mL (ref <0.03)

## 2018-03-03 LAB — GLUCOSE, CAPILLARY: Glucose-Capillary: 231 mg/dL — ABNORMAL HIGH (ref 70–99)

## 2018-03-03 LAB — CBC
HCT: 40.6 % (ref 36.0–46.0)
HEMOGLOBIN: 13.1 g/dL (ref 12.0–15.0)
MCH: 28.9 pg (ref 26.0–34.0)
MCHC: 32.3 g/dL (ref 30.0–36.0)
MCV: 89.6 fL (ref 80.0–100.0)
Platelets: 276 10*3/uL (ref 150–400)
RBC: 4.53 MIL/uL (ref 3.87–5.11)
RDW: 12.4 % (ref 11.5–15.5)
WBC: 10.5 10*3/uL (ref 4.0–10.5)
nRBC: 0 % (ref 0.0–0.2)

## 2018-03-03 LAB — LIPASE, BLOOD: Lipase: 31 U/L (ref 11–51)

## 2018-03-03 NOTE — ED Triage Notes (Signed)
Pt in with co dizziness and generalized weakness states has been without insulin for a month. Also co chest pain and blurred vision.

## 2018-03-04 ENCOUNTER — Emergency Department
Admission: EM | Admit: 2018-03-04 | Discharge: 2018-03-04 | Disposition: A | Discharge status: Home / Self Care | Payer: BLUE CROSS/BLUE SHIELD | Attending: Emergency Medicine | Admitting: Emergency Medicine

## 2018-03-04 DIAGNOSIS — E1165 Type 2 diabetes mellitus with hyperglycemia: Secondary | ICD-10-CM | POA: Diagnosis not present

## 2018-03-04 DIAGNOSIS — R739 Hyperglycemia, unspecified: Secondary | ICD-10-CM

## 2018-03-04 LAB — GLUCOSE, CAPILLARY: Glucose-Capillary: 232 mg/dL — ABNORMAL HIGH (ref 70–99)

## 2018-03-04 MED ORDER — INSULIN ASPART 100 UNIT/ML FLEXPEN: 15.0000 [IU] | PEN_INJECTOR | Freq: Three times a day (TID) | SUBCUTANEOUS | 11 refills | Status: DC

## 2018-03-04 NOTE — ED Notes (Signed)
Pt stated that she has been able to take her insulin due to not having any because her doctor did not send a RX yet. Pt as been without medication for over a month.

## 2018-03-05 NOTE — ED Provider Notes (Signed)
[facility] Emergency Department Provider Note   First MD Initiated Contact with Patient 03/04/18 0158     (approximate)  I have reviewed the triage vital signs and the nursing notes.   HISTORY  Chief Complaint Hyperglycemia    HPI Sabrina Hanna is a 43 y.o. female with below list of chronic medical conditions including diabetes presents to the emergency department hyperglycemia and generalized weakness that the patient states has been occurring for the past month.  Patient states that she has been without her insulin for 1 month.  Patient denies any headache no nausea or vomiting.  Patient denies any fever afebrile on presentation.   Past Medical History:  Diagnosis Date  . Diabetes mellitus without complication (HCC)   . Hypertriglyceridemia   . Pancreatitis     Patient Active Problem List   Diagnosis Date Noted  . Pancreatitis 06/28/2017  . 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 Intentional metformin overdose (HCC) 04/08/2016  . Diabetes mellitus without complication (HCC) 04/08/2016    Past Surgical History:  Procedure Laterality Date  . ABDOMINAL HYSTERECTOMY    . LAPAROSCOPIC UNILATERAL SALPINGO OOPHERECTOMY Right     Prior to Admission medications   Medication Sig Start Date End Date Taking? Authorizing Provider  atorvastatin (LIPITOR) 20 MG tablet Take 20 mg by mouth at bedtime. 06/26/17   [provider]  fenofibrate (TRICOR) 145 MG tablet Take 145 mg by mouth daily. 06/26/17   [provider]  HUMALOG 100 UNIT/ML injection Inject 6 Units into the skin 3 (three) times daily. 06/26/17   [provider]  HYDROmorphone (DILAUDID) 2 MG tablet Take 1-2 tablets by mouth every 6 (six) hours as needed for severe pain. 06/26/17   [provider]  insulin aspart (NOVOLOG FLEXPEN) 100 UNIT/ML FlexPen Inject 15 Units into the skin 3 (three) times daily with meals. 03/04/18    Darci CurrentBrown, [place] N, MD  LANTUS 100 UNIT/ML injection Inject 16 Units into the skin at bedtime. 06/26/17   [provider]  Omega-3 1000 MG CAPS Take 1 capsule by mouth daily. 06/27/17 06/27/18  [provider]  pantoprazole (PROTONIX) 40 MG tablet Take 40 mg by mouth daily. 04/10/17   [provider]  senna (SENOKOT) 8.6 MG TABS tablet Take 2 tablets by mouth 2 (two) times daily.     [provider]    Allergies Latex and Penicillins  Family History  Problem Relation Age of Onset  . Diabetes Mellitus II Maternal Grandmother   . Diabetes Mellitus II Sister     Social History Social History   Tobacco Use  . Smoking status: Never Smoker  . Smokeless tobacco: Never Used  Substance Use Topics  . Alcohol use: No  . Drug use: No    Review of Systems Constitutional: No fever/chills Eyes: No visual changes. ENT: No sore throat. Cardiovascular: Denies chest pain. Respiratory: Denies shortness of breath. Gastrointestinal: No abdominal pain.  No nausea, no vomiting.  No diarrhea.  No constipation. Genitourinary: Negative for dysuria. Musculoskeletal: Negative for neck pain.  Negative for back pain. Integumentary: Negative for rash. Neurological: Negative for headaches, focal weakness or numbness. Endocrine: Positive for hyperglycemia  ____________________________________________   PHYSICAL EXAM:  VITAL SIGNS: ED Triage Vitals  Enc Vitals Group     BP 03/03/18 2131 122/88     Pulse Rate 03/03/18 2131 85     Resp 03/03/18 2131 20     Temp 03/03/18 2131 98.1 F (  36.7 C)     Temp Source 03/03/18 2131 Oral     SpO2 03/03/18 2131 98 %     Weight 03/03/18 2133 72.6 kg (160 lb)     Height --      Head Circumference --      Peak Flow --      Pain Score 03/03/18 2131 9     Pain Loc --      Pain Edu? --      Excl. in GC? --     Constitutional: Alert and oriented. Well appearing and in no acute distress. Eyes: Conjunctivae are normal.    Mouth/Throat: Mucous membranes are moist. Oropharynx non-erythematous. Neck: No stridor.   Cardiovascular: Normal rate, regular rhythm. Good peripheral circulation. Grossly normal heart sounds. Respiratory: Normal respiratory effort.  No retractions. Lungs CTAB. Gastrointestinal: Soft and nontender. No distention.  Musculoskeletal: No lower extremity tenderness nor edema. No gross deformities of extremities. Neurologic:  Normal speech and language. No gross focal neurologic deficits are appreciated.  Skin:  Skin is warm, dry and intact. No rash noted. Psychiatric: Mood and affect are normal. Speech and behavior are normal.  ____________________________________________   LABS (all labs ordered are listed, but only abnormal results are displayed)  Labs Reviewed  COMPREHENSIVE METABOLIC PANEL - Abnormal; Notable for the following components:      Result Value   Sodium 134 (*)    Glucose, Bld 282 (*)    AST 53 (*)    ALT 58 (*)    All other components within normal limits  URINALYSIS, COMPLETE (UACMP) WITH MICROSCOPIC - Abnormal; Notable for the following components:   Color, Urine YELLOW (*)    APPearance CLEAR (*)    Specific Gravity, Urine 1.033 (*)    Glucose, UA >=500 (*)    Ketones, ur 5 (*)    Bacteria, UA RARE (*)    All other components within normal limits  GLUCOSE, CAPILLARY - Abnormal; Notable for the following components:   Glucose-Capillary 231 (*)    All other components within normal limits  GLUCOSE, CAPILLARY - Abnormal; Notable for the following components:   Glucose-Capillary 232 (*)    All other components within normal limits  CBC  TROPONIN I  LIPASE, BLOOD   ____________________________________________  EKG  ED ECG REPORT I, Centre N BROWN, the attending physician, personally viewed and interpreted this ECG.   Date: 03/03/2018  EKG Time: 9:36 PM  Rate: 80  Rhythm: Normal sinus rhythm  Axis: Normal  Intervals: Normal  ST&T Change:  None  ____________________ Procedures   ____________________________________________   INITIAL IMPRESSION / ASSESSMENT AND PLAN / ED COURSE  As part of my medical decision making, I reviewed the following data within the electronic MEDICAL RECORD NUMBER  43 year old female presenting with above-stated history and physical exam of hyperglycemia secondary to medication noncompliance.  Patient prescribed FlexPen after reviewing the patient's endocrinologist note.  Patient states that she does not know what happened to that prescription. ____________________________________________  FINAL CLINICAL IMPRESSION(S) / ED DIAGNOSES  Final diagnoses:  Hyperglycemia     MEDICATIONS GIVEN DURING THIS VISIT:  Medications - No data to display   ED Discharge Orders         Ordered    insulin aspart (NOVOLOG FLEXPEN) 100 UNIT/ML FlexPen  3 times daily with meals     03/04/18 0214           Note:  This document was prepared using Dragon voice recognition software and may include  unintentional dictation errors.    Darci Current, MD 03/05/18 [id number]

## 2018-05-04 ENCOUNTER — Emergency Department: Payer: BLUE CROSS/BLUE SHIELD

## 2018-05-04 ENCOUNTER — Emergency Department
Admission: EM | Admit: 2018-05-04 | Discharge: 2018-05-04 | Disposition: A | Discharge status: Home / Self Care | Payer: BLUE CROSS/BLUE SHIELD | Attending: Emergency Medicine | Admitting: Emergency Medicine

## 2018-05-04 ENCOUNTER — Encounter: Payer: Self-pay | Admitting: Emergency Medicine

## 2018-05-04 DIAGNOSIS — E119 Type 2 diabetes mellitus without complications: Secondary | ICD-10-CM | POA: Insufficient documentation

## 2018-05-04 DIAGNOSIS — M6281 Muscle weakness (generalized): Secondary | ICD-10-CM | POA: Insufficient documentation

## 2018-05-04 DIAGNOSIS — B349 Viral infection, unspecified: Secondary | ICD-10-CM | POA: Diagnosis not present

## 2018-05-04 DIAGNOSIS — Z794 Long term (current) use of insulin: Secondary | ICD-10-CM | POA: Insufficient documentation

## 2018-05-04 DIAGNOSIS — B9789 Other viral agents as the cause of diseases classified elsewhere: Secondary | ICD-10-CM

## 2018-05-04 DIAGNOSIS — R6883 Chills (without fever): Secondary | ICD-10-CM | POA: Insufficient documentation

## 2018-05-04 DIAGNOSIS — R0981 Nasal congestion: Secondary | ICD-10-CM | POA: Diagnosis present

## 2018-05-04 DIAGNOSIS — R05 Cough: Secondary | ICD-10-CM | POA: Diagnosis not present

## 2018-05-04 DIAGNOSIS — Z91040 Latex allergy status: Secondary | ICD-10-CM | POA: Insufficient documentation

## 2018-05-04 DIAGNOSIS — J988 Other specified respiratory disorders: Secondary | ICD-10-CM

## 2018-05-04 LAB — INFLUENZA PANEL BY PCR (TYPE A & B)
Influenza A By PCR: NEGATIVE
Influenza B By PCR: NEGATIVE

## 2018-05-04 LAB — GLUCOSE, CAPILLARY: Glucose-Capillary: 132 mg/dL — ABNORMAL HIGH (ref 70–99)

## 2018-05-04 MED ORDER — OSELTAMIVIR PHOSPHATE 75 MG PO CAPS: 75.0000 mg | ORAL_CAPSULE | Freq: Two times a day (BID) | ORAL | 0 refills | Status: AC

## 2018-05-04 MED ORDER — PSEUDOEPH-BROMPHEN-DM 30-2-10 MG/5ML PO SYRP: 5.0000 mL | ORAL_SOLUTION | Freq: Four times a day (QID) | ORAL | 0 refills | Status: DC | PRN

## 2018-05-04 MED ORDER — KETOROLAC TROMETHAMINE 60 MG/2ML IM SOLN
60.0000 mg | Freq: Once | INTRAMUSCULAR | Status: AC
  Administered 2018-05-04: 60 mg via INTRAMUSCULAR
  Filled 2018-05-04: qty 2

## 2018-05-04 MED ORDER — BENZONATATE 100 MG PO CAPS
200.0000 mg | ORAL_CAPSULE | Freq: Once | ORAL | Status: AC
  Administered 2018-05-04: 200 mg via ORAL
  Filled 2018-05-04: qty 2

## 2018-05-04 MED ORDER — KETOROLAC TROMETHAMINE 10 MG PO TABS: 10.0000 mg | ORAL_TABLET | Freq: Four times a day (QID) | ORAL | 0 refills | Status: DC | PRN

## 2018-05-04 MED ORDER — OSELTAMIVIR PHOSPHATE 75 MG PO CAPS: 75.0000 mg | ORAL_CAPSULE | Freq: Two times a day (BID) | ORAL | 0 refills | Status: DC

## 2018-05-04 NOTE — ED Triage Notes (Signed)
Patient presents to ED via POV from home with c/o flu like symptoms that began Saturday. Patient reports generalized weakness, chills and nasal congestion. Family has been recently dx with flu. Masked placed on patient.

## 2018-05-04 NOTE — ED Notes (Signed)
First Nurse Note: Patient sitting in [facility] wearing face mask.  Husband states his daughter and himself recently had the flu and now the patient is experiencing similar symptoms.

## 2018-05-04 NOTE — ED Notes (Signed)
See triage note  States she has been exposed to the flu  Body aches and subjective fever

## 2018-05-04 NOTE — ED Provider Notes (Signed)
[facility] Emergency Department Provider Note   ____________________________________________   First MD Initiated Contact with Patient 05/04/18 1009     (approximate)  I have reviewed the triage vital signs and the nursing notes.   HISTORY  Chief Complaint Nasal Congestion    HPI Sabrina Hanna is a 43 y.o. female patient presents flulike symptoms which began 2 days ago.  Patient reports generalized weakness, chills, nasal congestion and cough.  Patient state close family members have recently diagnosed with flu.  Patient denies any recent travel.  Patient denies nausea, vomiting, diarrhea.  No palliative measure for complaint.  Patient rates pain as a 10/10.  Patient described pain is "achy".  No palliative measure for complaint.         Past Medical History:  Diagnosis Date  . Diabetes mellitus without complication (HCC)   . Hypertriglyceridemia   . Pancreatitis     Patient Active Problem List   Diagnosis Date Noted  . Pancreatitis 06/28/2017  . 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 Intentional metformin overdose (HCC) 04/08/2016  . Diabetes mellitus without complication (HCC) 04/08/2016    Past Surgical History:  Procedure Laterality Date  . ABDOMINAL HYSTERECTOMY    . LAPAROSCOPIC UNILATERAL SALPINGO OOPHERECTOMY Right     Prior to Admission medications   Medication Sig Start Date End Date Taking? Authorizing Provider  atorvastatin (LIPITOR) 20 MG tablet Take 20 mg by mouth at bedtime. 06/26/17   [provider]  brompheniramine-pseudoephedrine-DM 30-2-10 MG/5ML syrup Take 5 mLs by mouth 4 (four) times daily as needed. 05/04/18   Joni Reining, PA-C  fenofibrate (TRICOR) 145 MG tablet Take 145 mg by mouth daily. 06/26/17   [provider]  HUMALOG 100 UNIT/ML injection Inject 6 Units into the skin 3 (three) times daily. 06/26/17   [provider]  HYDROmorphone  (DILAUDID) 2 MG tablet Take 1-2 tablets by mouth every 6 (six) hours as needed for severe pain. 06/26/17   [provider]  insulin aspart (NOVOLOG FLEXPEN) 100 UNIT/ML FlexPen Inject 15 Units into the skin 3 (three) times daily with meals. 03/04/18   Darci Current, MD  ketorolac (TORADOL) 10 MG tablet Take 1 tablet (10 mg total) by mouth every 6 (six) hours as needed. 05/04/18   Joni Reining, PA-C  LANTUS 100 UNIT/ML injection Inject 16 Units into the skin at bedtime. 06/26/17   [provider]  Omega-3 1000 MG CAPS Take 1 capsule by mouth daily. 06/27/17 06/27/18  [provider]  oseltamivir (TAMIFLU) 75 MG capsule Take 1 capsule (75 mg total) by mouth 2 (two) times daily for 5 days. 05/04/18 05/09/18  Joni Reining, PA-C  pantoprazole (PROTONIX) 40 MG tablet Take 40 mg by mouth daily. 04/10/17   [provider]  senna (SENOKOT) 8.6 MG TABS tablet Take 2 tablets by mouth 2 (two) times daily.     [provider]    Allergies Latex and Penicillins  Family History  Problem Relation Age of Onset  . Diabetes Mellitus II Maternal Grandmother   . Diabetes Mellitus II Sister     Social History Social History   Tobacco Use  . Smoking status: Never Smoker  . Smokeless tobacco: Never Used  Substance Use Topics  . Alcohol use: No  . Drug use: No    Review of Systems Constitutional: No fever/chills Eyes: No visual changes. ENT: No sore throat.  Nasal congestion. Cardiovascular: Denies chest pain. Respiratory:  Denies shortness of breath.  Ducted cough. Gastrointestinal: No abdominal pain.  No nausea, no vomiting.  No diarrhea.  No constipation. Genitourinary: Negative for dysuria. Musculoskeletal: Negative for back pain. Skin: Negative for rash. Neurological: Negative for headaches, focal weakness or numbness. Endocrine:  Diabetes, hyperlipidemia, pancreatitis. Allergic/Immunilogical: Latex and  penicillin. ____________________________________________   PHYSICAL EXAM:  VITAL SIGNS: ED Triage Vitals  Enc Vitals Group     BP 05/04/18 0917 109/71     Pulse Rate 05/04/18 0917 80     Resp 05/04/18 0917 18     Temp 05/04/18 0917 98.3 F (36.8 C)     Temp Source 05/04/18 0917 Oral     SpO2 05/04/18 0917 97 %     Weight 05/04/18 0929 165 lb (74.8 kg)     Height 05/04/18 0929 5' (1.524 m)     Head Circumference --      Peak Flow --      Pain Score 05/04/18 0928 10     Pain Loc --      Pain Edu? --      Excl. in GC? --     Constitutional: Alert and oriented. Well appearing and in no acute distress. Eyes: Conjunctivae are normal. PERRL. EOMI. Head: Atraumatic. Nose: Edematous nasal turbinates. Mouth/Throat: Mucous membranes are moist.  Oropharynx non-erythematous.  Postnasal drainage. Neck: No stridor.  Hematological/Lymphatic/Immunilogical: No cervical lymphadenopathy. Cardiovascular: Normal rate, regular rhythm. Grossly normal heart sounds.  Good peripheral circulation. Respiratory: Normal respiratory effort.  No retractions. Lungs CTAB. Gastrointestinal: Soft and nontender. No distention. No abdominal bruits. No CVA tenderness. Musculoskeletal: No lower extremity tenderness nor edema.  No joint effusions. Neurologic:  Normal speech and language. No gross focal neurologic deficits are appreciated. No gait instability. Skin:  Skin is warm, dry and intact. No rash noted. Psychiatric: Mood and affect are normal. Speech and behavior are normal.  ____________________________________________   LABS (all labs ordered are listed, but only abnormal results are displayed)  Labs Reviewed  GLUCOSE, CAPILLARY - Abnormal; Notable for the following components:      Result Value   Glucose-Capillary 132 (*)    All other components within normal limits  INFLUENZA PANEL BY PCR (TYPE A & B)  CBG MONITORING, ED    ____________________________________________  EKG   ____________________________________________  RADIOLOGY  ED MD interpretation:    Official radiology report(s): Dg Chest 2 View  Result Date: 05/04/2018 CLINICAL DATA:  Fever and cough. EXAM: CHEST - 2 VIEW COMPARISON:  10/05/2015 FINDINGS: The heart size and mediastinal contours are within normal limits. Both lungs are clear. The visualized skeletal structures are unremarkable. IMPRESSION: Normal exam. Electronically Signed   By: Francene Boyers M.D.   On: 05/04/2018 11:01    ____________________________________________   PROCEDURES  Procedure(s) performed (including Critical Care):  Procedures   ____________________________________________   INITIAL IMPRESSION / ASSESSMENT AND PLAN / ED COURSE  As part of my medical decision making, I reviewed the following data within the electronic MEDICAL RECORD NUMBER     Patient presents with flulike symptom status post exposure by close family members.  Although flu results were negative will treat prophylactically.  Patient given discharge care instruction advised take medication as directed.  Follow-up PCP.          ____________________________________________   FINAL CLINICAL IMPRESSION(S) / ED DIAGNOSES  Final diagnoses:  Viral respiratory illness     ED Discharge Orders         Ordered    oseltamivir (TAMIFLU) 75 MG  capsule  2 times daily     05/04/18 1153    brompheniramine-pseudoephedrine-DM 30-2-10 MG/5ML syrup  4 times daily PRN     05/04/18 1153    ketorolac (TORADOL) 10 MG tablet  Every 6 hours PRN     05/04/18 1153           Note:  This document was prepared using Dragon voice recognition software and may include unintentional dictation errors.    Joni Reining, PA-C 05/04/18 1155    Sharman Cheek, MD 05/06/18 [phone]

## 2018-05-22 ENCOUNTER — Other Ambulatory Visit: Payer: Self-pay

## 2018-05-22 ENCOUNTER — Emergency Department
Admission: EM | Admit: 2018-05-22 | Discharge: 2018-05-22 | Disposition: A | Discharge status: Home / Self Care | Payer: BLUE CROSS/BLUE SHIELD | Attending: Emergency Medicine | Admitting: Emergency Medicine

## 2018-05-22 ENCOUNTER — Emergency Department: Payer: BLUE CROSS/BLUE SHIELD

## 2018-05-22 ENCOUNTER — Encounter: Payer: Self-pay | Admitting: Emergency Medicine

## 2018-05-22 DIAGNOSIS — R1032 Left lower quadrant pain: Secondary | ICD-10-CM | POA: Diagnosis present

## 2018-05-22 DIAGNOSIS — Z79899 Other long term (current) drug therapy: Secondary | ICD-10-CM | POA: Insufficient documentation

## 2018-05-22 DIAGNOSIS — B3731 Acute candidiasis of vulva and vagina: Secondary | ICD-10-CM

## 2018-05-22 DIAGNOSIS — B373 Candidiasis of vulva and vagina: Secondary | ICD-10-CM | POA: Diagnosis not present

## 2018-05-22 DIAGNOSIS — N760 Acute vaginitis: Secondary | ICD-10-CM | POA: Diagnosis not present

## 2018-05-22 DIAGNOSIS — B9689 Other specified bacterial agents as the cause of diseases classified elsewhere: Secondary | ICD-10-CM

## 2018-05-22 DIAGNOSIS — N952 Postmenopausal atrophic vaginitis: Secondary | ICD-10-CM | POA: Insufficient documentation

## 2018-05-22 DIAGNOSIS — E119 Type 2 diabetes mellitus without complications: Secondary | ICD-10-CM | POA: Diagnosis not present

## 2018-05-22 DIAGNOSIS — Z794 Long term (current) use of insulin: Secondary | ICD-10-CM | POA: Diagnosis not present

## 2018-05-22 LAB — CBC
HCT: 35.8 % — ABNORMAL LOW (ref 36.0–46.0)
Hemoglobin: 11.6 g/dL — ABNORMAL LOW (ref 12.0–15.0)
MCH: 29.1 pg (ref 26.0–34.0)
MCHC: 32.4 g/dL (ref 30.0–36.0)
MCV: 89.7 fL (ref 80.0–100.0)
Platelets: 287 10*3/uL (ref 150–400)
RBC: 3.99 MIL/uL (ref 3.87–5.11)
RDW: 12.9 % (ref 11.5–15.5)
WBC: 10.9 10*3/uL — ABNORMAL HIGH (ref 4.0–10.5)
nRBC: 0 % (ref 0.0–0.2)

## 2018-05-22 LAB — WET PREP, GENITAL
Clue Cells Wet Prep HPF POC: NONE SEEN
Sperm: NONE SEEN
Trich, Wet Prep: NONE SEEN
Yeast Wet Prep HPF POC: NONE SEEN

## 2018-05-22 LAB — BASIC METABOLIC PANEL
Anion gap: 9 (ref 5–15)
BUN: 10 mg/dL (ref 6–20)
CO2: 20 mmol/L — ABNORMAL LOW (ref 22–32)
Calcium: 9 mg/dL (ref 8.9–10.3)
Chloride: 108 mmol/L (ref 98–111)
Creatinine, Ser: 0.78 mg/dL (ref 0.44–1.00)
GFR calc Af Amer: >60 mL/min (ref >60)
GFR calc non Af Amer: >60 mL/min (ref >60)
Glucose, Bld: 259 mg/dL — ABNORMAL HIGH (ref 70–99)
Potassium: 3.5 mmol/L (ref 3.5–5.1)
Sodium: 137 mmol/L (ref 135–145)

## 2018-05-22 LAB — URINALYSIS, COMPLETE (UACMP) WITH MICROSCOPIC
Bacteria, UA: NONE SEEN
Bilirubin Urine: NEGATIVE
Hgb urine dipstick: NEGATIVE
Ketones, ur: NEGATIVE mg/dL
Leukocytes,Ua: NEGATIVE
Nitrite: NEGATIVE
Protein, ur: NEGATIVE mg/dL
Specific Gravity, Urine: 1.026 (ref 1.005–1.030)
pH: 6 (ref 5.0–8.0)

## 2018-05-22 LAB — LIPASE, BLOOD: Lipase: 33 U/L (ref 11–51)

## 2018-05-22 MED ORDER — ESTRADIOL 0.1 MG/GM VA CREA: 1.0000 g | TOPICAL_CREAM | VAGINAL | 1 refills | Status: AC

## 2018-05-22 MED ORDER — OXYCODONE-ACETAMINOPHEN 5-325 MG PO TABS
1.0000 | ORAL_TABLET | Freq: Once | ORAL | Status: AC
  Administered 2018-05-22: 1 via ORAL
  Filled 2018-05-22: qty 1

## 2018-05-22 MED ORDER — FLUCONAZOLE 150 MG PO TABS: 150.0000 mg | ORAL_TABLET | Freq: Every day | ORAL | 0 refills | Status: DC

## 2018-05-22 MED ORDER — METRONIDAZOLE 500 MG PO TABS: 500.0000 mg | ORAL_TABLET | Freq: Two times a day (BID) | ORAL | 0 refills | Status: AC

## 2018-05-22 NOTE — ED Notes (Signed)
Patient transported to CT 

## 2018-05-22 NOTE — ED Triage Notes (Signed)
Pt arrives POV to triage with c/o urinary pain x 3 days that is now in her left flank. Pt reports no antibiotics and states that she only has pain in her left flank at this time. Pt is in NAD.

## 2018-05-22 NOTE — ED Provider Notes (Signed)
[facility] Emergency Department Provider Note  ____________________________________________  Time seen: Approximately 8:55 PM  I have reviewed the triage vital signs and the nursing notes.   HISTORY  Chief Complaint Flank Pain    HPI Sabrina Hanna is a 43 y.o. female with a history of diabetes and pancreatitis, presents to the emergency department with dysuria for the past 3 days and  left flank pain. She denies increased urinary frequency or hematuria.  Patient denies nausea or vomiting.  She denies fever or chills at home.  Patient denies any history of nephrolithiasis or pyelonephritis.  She has had white vaginal discharge and complains of vaginal pruritus.  No changes in vaginal odor.  Patient states that she experiences episodes of dysuria approximately 3 times a year and other providers have told her that it is due to her thin vaginal skin.  Patient reports that she has been using coconut oil but states that is not helping her symptoms.  Patient also states that she had to have Diflucan in the past.  Past Medical History:  Diagnosis Date  . Diabetes mellitus without complication (HCC)   . Hypertriglyceridemia   . Pancreatitis     Patient Active Problem List   Diagnosis Date Noted  . Pancreatitis 06/28/2017  . 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 Intentional metformin overdose (HCC) 04/08/2016  . Diabetes mellitus without complication (HCC) 04/08/2016    Past Surgical History:  Procedure Laterality Date  . ABDOMINAL HYSTERECTOMY    . LAPAROSCOPIC UNILATERAL SALPINGO OOPHERECTOMY Right     Prior to Admission medications   Medication Sig Start Date End Date Taking? Authorizing Provider  atorvastatin (LIPITOR) 20 MG tablet Take 20 mg by mouth at bedtime. 06/26/17   [provider]  brompheniramine-pseudoephedrine-DM 30-2-10 MG/5ML syrup Take 5 mLs by mouth 4 (four) times daily as needed.  05/04/18   Joni Reining, PA-C  estradiol (ESTRACE) 0.1 MG/GM vaginal cream Place 1 g vaginally 3 (three) times a week. 05/24/18 05/24/19  Orvil Feil, PA-C  fenofibrate (TRICOR) 145 MG tablet Take 145 mg by mouth daily. 06/26/17   [provider]  fluconazole (DIFLUCAN) 150 MG tablet Take 1 tablet (150 mg total) by mouth daily. 05/22/18   Orvil Feil, PA-C  HUMALOG 100 UNIT/ML injection Inject 6 Units into the skin 3 (three) times daily. 06/26/17   [provider]  HYDROmorphone (DILAUDID) 2 MG tablet Take 1-2 tablets by mouth every 6 (six) hours as needed for severe pain. 06/26/17   [provider]  insulin aspart (NOVOLOG FLEXPEN) 100 UNIT/ML FlexPen Inject 15 Units into the skin 3 (three) times daily with meals. 03/04/18   Darci Current, MD  ketorolac (TORADOL) 10 MG tablet Take 1 tablet (10 mg total) by mouth every 6 (six) hours as needed. 05/04/18   Joni Reining, PA-C  LANTUS 100 UNIT/ML injection Inject 16 Units into the skin at bedtime. 06/26/17   [provider]  metroNIDAZOLE (FLAGYL) 500 MG tablet Take 1 tablet (500 mg total) by mouth 2 (two) times daily for 7 days. 05/22/18 05/29/18  Orvil Feil, PA-C  Omega-3 1000 MG CAPS Take 1 capsule by mouth daily. 06/27/17 06/27/18  [provider]  pantoprazole (PROTONIX) 40 MG tablet Take 40 mg by mouth daily. 04/10/17   [provider]  senna (SENOKOT) 8.6 MG TABS tablet Take 2 tablets by mouth 2 (two) times daily.     [provider]  Allergies Latex and Penicillins  Family History  Problem Relation Age of Onset  . Diabetes Mellitus II Maternal Grandmother   . Diabetes Mellitus II Sister     Social History Social History   Tobacco Use  . Smoking status: Never Smoker  . Smokeless tobacco: Never Used  Substance Use Topics  . Alcohol use: No  . Drug use: No     Review of Systems  Constitutional: No fever/chills Eyes: No visual changes. No discharge ENT: No upper  respiratory complaints. Cardiovascular: no chest pain. Respiratory: no cough. No SOB. Gastrointestinal: No abdominal pain.  No nausea, no vomiting.  No diarrhea.  No constipation. Genitourinary: Patient has dysuria and left flank pain.  Musculoskeletal: Negative for musculoskeletal pain. Skin: Negative for rash, abrasions, lacerations, ecchymosis. Neurological: Negative for headaches, focal weakness or numbness.   ____________________________________________   PHYSICAL EXAM:  VITAL SIGNS: ED Triage Vitals  Enc Vitals Group     BP 05/22/18 1920 109/63     Pulse Rate 05/22/18 1920 80     Resp 05/22/18 1920 18     Temp 05/22/18 1920 98.2 F (36.8 C)     Temp Source 05/22/18 1920 Oral     SpO2 05/22/18 1920 98 %     Weight 05/22/18 1915 165 lb (74.8 kg)     Height 05/22/18 1915 5' (1.524 m)     Head Circumference --      Peak Flow --      Pain Score 05/22/18 1915 10     Pain Loc --      Pain Edu? --      Excl. in GC? --      Constitutional: Alert and oriented. Well appearing and in no acute distress. Eyes: Conjunctivae are normal. PERRL. EOMI. Head: Atraumatic. Cardiovascular: Normal rate, regular rhythm. Normal S1 and S2.  Good peripheral circulation. Respiratory: Normal respiratory effort without tachypnea or retractions. Lungs CTAB. Good air entry to the bases with no decreased or absent breath sounds. Gastrointestinal: Bowel sounds 4 quadrants. Soft and nontender to palpation. No guarding or rigidity. No palpable masses. No distention. Musculoskeletal: Full range of motion to all extremities. No gross deformities appreciated. Neurologic:  Normal speech and language. No gross focal neurologic deficits are appreciated.  Skin:  Skin is warm, dry and intact. No rash noted. Psychiatric: Mood and affect are normal. Speech and behavior are normal. Patient exhibits appropriate insight and judgement.   ____________________________________________   LABS (all labs ordered  are listed, but only abnormal results are displayed)  Labs Reviewed  WET PREP, GENITAL - Abnormal; Notable for the following components:      Result Value   WBC, Wet Prep HPF POC FEW (*)    All other components within normal limits  URINALYSIS, COMPLETE (UACMP) WITH MICROSCOPIC - Abnormal; Notable for the following components:   Color, Urine YELLOW (*)    APPearance CLEAR (*)    Glucose, UA >=500 (*)    All other components within normal limits  CBC - Abnormal; Notable for the following components:   WBC 10.9 (*)    Hemoglobin 11.6 (*)    HCT 35.8 (*)    All other components within normal limits  BASIC METABOLIC PANEL - Abnormal; Notable for the following components:   CO2 20 (*)    Glucose, Bld 259 (*)    All other components within normal limits  LIPASE, BLOOD   ____________________________________________  EKG   ____________________________________________  RADIOLOGY I personally viewed and evaluated these images  as part of my medical decision making, as well as reviewing the written report by the radiologist    Ct Renal Stone Study  Result Date: 05/22/2018 CLINICAL DATA:  Flank pain, stone disease suspected. Patient reports left flank pain. EXAM: CT ABDOMEN AND PELVIS WITHOUT CONTRAST TECHNIQUE: Multidetector CT imaging of the abdomen and pelvis was performed following the standard protocol without IV contrast. COMPARISON:  CT 10/21/2014 FINDINGS: Lower chest: The lung bases are clear. Hepatobiliary: Liver is enlarged spanning 24 cm cranial caudal with steatosis. Focal fatty sparing adjacent to the gallbladder fossa. Gallbladder is partially distended. No calcified gallstone or pericholecystic inflammation. Pancreas: No ductal dilatation or inflammation. Spleen: Normal in size without focal abnormality. Adrenals/Urinary Tract: Normal adrenal glands. No renal stones or hydronephrosis. No perinephric edema. Both ureters are decompressed without stones along the course. Urinary  bladder is physiologically distended. No wall thickening or stone. Stomach/Bowel: Bowel evaluation is limited in the absence of enteric contrast. Ingested material in the stomach. No bowel obstruction or inflammatory change. Appendix tentatively identified. No evidence of appendicitis. Small to moderate colonic stool burden without colonic wall thickening. Vascular/Lymphatic: Abdominal aorta is normal in caliber. Few prominent periportal nodes are likely reactive. Reproductive: Status post hysterectomy. No adnexal masses. Other: Subcutaneous soft tissue densities in the anterior abdominal wall typical of medication injection sites. No intra-abdominal ascites or free air. Small fat containing supraumbilical ventral abdominal wall hernia without bowel involvement or inflammation. Musculoskeletal: There are no acute or suspicious osseous abnormalities. IMPRESSION: 1. No renal stones or obstructive uropathy. No acute abnormality in the abdomen/pelvis. 2. Hepatomegaly and hepatic steatosis. Electronically Signed   By: Narda Rutherford M.D.   On: 05/22/2018 22:23    ____________________________________________    PROCEDURES  Procedure(s) performed:    Procedures    Medications  oxyCODONE-acetaminophen (PERCOCET/ROXICET) 5-325 MG per tablet 1 tablet (1 tablet Oral Given 05/22/18 2119)     ____________________________________________   INITIAL IMPRESSION / ASSESSMENT AND PLAN / ED COURSE  Pertinent labs & imaging results that were available during my care of the patient were reviewed by me and considered in my medical decision making (see chart for details).  Review of the [place] CSRS was performed in accordance of the [facility] prior to dispensing any controlled drugs.      Assessment and Plan:  Atrophic vaginitis Yeast vaginitis Bacterial vaginosis Patient presents to the emergency department with dysuria for the past 3 days and left flank pain that started within the past 24 hours. Original  differential diagnosis included cystitis, pyelonephritis and nephrolithiasis, atrophic vaginitis, yeast vaginitis bacterial vaginosis.   Patient denied fever, chills or nausea or vomiting, decreasing suspicion for pyelonephritis.  Urinalysis was unremarkable for cystitis.  No evidence of renal calculi on CT renal stone study.  On physical exam, patient had evidence of thick white vaginal discharge and vaginal atrophy was visualized with signs of excoriation.  Patient states that she experienced similar episodes in the past.  Patient's past medical history was referenced during this emergency department encounter and patient has been diagnosed with vaginal atrophy with yeast vaginitis in the past.  Patient was discharged with estradiol cream and Diflucan.  I also treated patient empirically for BV with Flagyl.  Patient was given strict return precautions to return to the emergency department for new or worsening symptoms.  All patient questions were answered.   ____________________________________________  FINAL CLINICAL IMPRESSION(S) / ED DIAGNOSES  Final diagnoses:  Atrophic vaginitis  Yeast vaginitis  Bacterial vaginosis  NEW MEDICATIONS STARTED DURING THIS VISIT:  ED Discharge Orders         Ordered    estradiol (ESTRACE) 0.1 MG/GM vaginal cream  3 times weekly     05/22/18 2327    metroNIDAZOLE (FLAGYL) 500 MG tablet  2 times daily     05/22/18 2327    fluconazole (DIFLUCAN) 150 MG tablet  Daily     05/22/18 2327              This chart was dictated using voice recognition software/Dragon. Despite best efforts to proofread, errors can occur which can change the meaning. Any change was purely unintentional.    Orvil Feil, PA-C 05/22/18 2355    Don Perking, [place], MD 05/23/18 Paulo Fruit

## 2018-09-21 ENCOUNTER — Encounter: Payer: Self-pay | Admitting: Emergency Medicine

## 2018-09-21 ENCOUNTER — Emergency Department
Admission: EM | Admit: 2018-09-21 | Discharge: 2018-09-21 | Disposition: A | Discharge status: Home / Self Care | Payer: Medicaid Other | Attending: Emergency Medicine | Admitting: Emergency Medicine

## 2018-09-21 ENCOUNTER — Other Ambulatory Visit: Payer: Self-pay

## 2018-09-21 DIAGNOSIS — R51 Headache: Secondary | ICD-10-CM | POA: Diagnosis not present

## 2018-09-21 DIAGNOSIS — Z794 Long term (current) use of insulin: Secondary | ICD-10-CM | POA: Insufficient documentation

## 2018-09-21 DIAGNOSIS — E119 Type 2 diabetes mellitus without complications: Secondary | ICD-10-CM | POA: Diagnosis not present

## 2018-09-21 DIAGNOSIS — R040 Epistaxis: Secondary | ICD-10-CM

## 2018-09-21 DIAGNOSIS — Z79899 Other long term (current) drug therapy: Secondary | ICD-10-CM | POA: Diagnosis not present

## 2018-09-21 DIAGNOSIS — R519 Headache, unspecified: Secondary | ICD-10-CM

## 2018-09-21 MED ORDER — AZITHROMYCIN 250 MG PO TABS: ORAL_TABLET | ORAL | 0 refills | Status: DC

## 2018-09-21 NOTE — ED Triage Notes (Signed)
Says she has nosebleed on left on and off since Saturday.  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

## 2018-09-21 NOTE — ED Notes (Signed)
See triage note  Presents with family with intermittent nose bleed  No bleeding at present

## 2018-09-21 NOTE — ED Provider Notes (Signed)
[facility] Emergency Department Provider Note  ____________________________________________   First MD Initiated Contact with Patient 09/21/18 1134     (approximate)  I have reviewed the triage vital signs and the nursing notes.   HISTORY  Chief Complaint Epistaxis Per Spanish interpreter  HPI Sabrina Hanna is a 43 y.o. female patient presents with history of intermittent nosebleeds on the left side for the last 3 days.  Patient states that she was working with Haematologistfiberglass insulation on Friday without any respiratory protection.  She states that she felt itching in her nose and also in her mouth.  It was after this that she began experiencing nosebleeds.  She admits to the interpreter that she did blow her nose multiple times thinking that she would get the fiberglass out.  She also has had some headache with this.  She denies any other symptoms.      Past Medical History:  Diagnosis Date  . Diabetes mellitus without complication (HCC)   . Hypertriglyceridemia   . Pancreatitis     Patient Active Problem List   Diagnosis Date Noted  . Pancreatitis 06/28/2017  . 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 Intentional metformin overdose (HCC) 04/08/2016  . Diabetes mellitus without complication (HCC) 04/08/2016    Past Surgical History:  Procedure Laterality Date  . ABDOMINAL HYSTERECTOMY    . LAPAROSCOPIC UNILATERAL SALPINGO OOPHERECTOMY Right     Prior to Admission medications   Medication Sig Start Date End Date Taking? Authorizing Provider  atorvastatin (LIPITOR) 20 MG tablet Take 20 mg by mouth at bedtime. 06/26/17   [provider]  azithromycin (ZITHROMAX Z-PAK) 250 MG tablet Take 2 tablets (500 mg) on  Day 1,  followed by 1 tablet (250 mg) once daily on Days 2 through 5. 09/21/18   Tommi RumpsSummers, Rhonda L, PA-C  brompheniramine-pseudoephedrine-DM 30-2-10 MG/5ML syrup Take 5 mLs by mouth 4 (four)  times daily as needed. 05/04/18   Joni ReiningSmith, Ronald K, PA-C  estradiol (ESTRACE) 0.1 MG/GM vaginal cream Place 1 g vaginally 3 (three) times a week. 05/24/18 05/24/19  Orvil FeilWoods, Jaclyn M, PA-C  fenofibrate (TRICOR) 145 MG tablet Take 145 mg by mouth daily. 06/26/17   [provider]  fluconazole (DIFLUCAN) 150 MG tablet Take 1 tablet (150 mg total) by mouth daily. 05/22/18   Orvil FeilWoods, Jaclyn M, PA-C  HUMALOG 100 UNIT/ML injection Inject 6 Units into the skin 3 (three) times daily. 06/26/17   [provider]  HYDROmorphone (DILAUDID) 2 MG tablet Take 1-2 tablets by mouth every 6 (six) hours as needed for severe pain. 06/26/17   [provider]  insulin aspart (NOVOLOG FLEXPEN) 100 UNIT/ML FlexPen Inject 15 Units into the skin 3 (three) times daily with meals. 03/04/18   Darci CurrentBrown, Essex N, MD  ketorolac (TORADOL) 10 MG tablet Take 1 tablet (10 mg total) by mouth every 6 (six) hours as needed. 05/04/18   Joni ReiningSmith, Ronald K, PA-C  LANTUS 100 UNIT/ML injection Inject 16 Units into the skin at bedtime. 06/26/17   [provider]  pantoprazole (PROTONIX) 40 MG tablet Take 40 mg by mouth daily. 04/10/17   [provider]  senna (SENOKOT) 8.6 MG TABS tablet Take 2 tablets by mouth 2 (two) times daily.     [provider]    Allergies Latex and Penicillins  Family History  Problem Relation Age of Onset  . Diabetes Mellitus II Maternal Grandmother   . Diabetes Mellitus II Sister  Social History Social History   Tobacco Use  . Smoking status: Never Smoker  . Smokeless tobacco: Never Used  Substance Use Topics  . Alcohol use: No  . Drug use: No    Review of Systems Constitutional: No fever/chills Eyes: No visual changes. ENT: No sore throat.  Positive left nares nosebleed. Cardiovascular: Denies chest pain. Respiratory: Denies shortness of breath. Gastrointestinal: .  No nausea, no vomiting.   Skin: Negative for rash. Neurological: Positive for headaches,  negative for focal weakness or numbness. ___________________________________________   PHYSICAL EXAM:  VITAL SIGNS: ED Triage Vitals  Enc Vitals Group     BP 09/21/18 1104 122/70     Pulse Rate 09/21/18 1104 77     Resp 09/21/18 1104 14     Temp 09/21/18 1104 98.6 F (37 C)     Temp Source 09/21/18 1104 Oral     SpO2 09/21/18 1104 97 %     Weight --      Height --      Head Circumference --      Peak Flow --      Pain Score 09/21/18 1106 0     Pain Loc --      Pain Edu? --      Excl. in GC? --     Constitutional: Alert and oriented. Well appearing and in no acute distress. Eyes: Conjunctivae are normal.  Head: Atraumatic. Nose: No congestion/rhinnorhea.  Anterior aspect of the left naris shows a very small superficial abrasion which currently is not actively bleeding.  Mild erythema.  No posterior drainage or evidence of posterior bleed present. Mouth/Throat: Mucous membranes are moist.  Oropharynx non-erythematous.  No posterior bleeding noted. Neck: No stridor.   Hematological/Lymphatic/Immunilogical: No cervical lymphadenopathy. Cardiovascular: Normal rate, regular rhythm. Grossly normal heart sounds.  Good peripheral circulation. Respiratory: Normal respiratory effort.  No retractions. Lungs CTAB. Musculoskeletal: Moves upper and lower extremities without any difficulty.  Normal gait was noted. Neurologic:  Normal speech and language. No gross focal neurologic deficits are appreciated. No gait instability. Skin:  Skin is warm, dry and intact. No rash noted. Psychiatric: Mood and affect are normal. Speech and behavior are normal.  ____________________________________________   LABS (all labs ordered are listed, but only abnormal results are displayed)  Labs Reviewed - No data to display  PROCEDURES  Procedure(s) performed (including Critical Care):  Procedures   ____________________________________________   INITIAL IMPRESSION / ASSESSMENT AND PLAN / ED  COURSE  As part of my medical decision making, I reviewed the following data within the electronic MEDICAL RECORD NUMBER Notes from prior ED visits and [place] Controlled Substance Database  43 year old female presents to the ED with complaint of intermittent nosebleeds for the last 3 to 4 days.  Patient states that prior to her nosebleed she was working with Haematologistfiberglass insulation without any respiratory protection.  She has had headaches and nosebleeds since that time.  She is not taking any aspirin or blood thinners.  With the interpreter we explained to her that holding the bridge of her nose does not help control bleeding.  We showed her the proper way of holding pressure on her nose and also not to tilt her head backwards.  Patient is aware and agrees to try this.  She admits that she has been blowing her nose a lot due to the fiberglass.  She is to follow-up with [facility] if she continues to have any continued problems.  She was placed on Zithromax to cover any  infection that may be caused from the irritation.  She is encouraged to take Tylenol if needed for her headache.  ____________________________________________   FINAL CLINICAL IMPRESSION(S) / ED DIAGNOSES  Final diagnoses:  Left-sided epistaxis  Sinus headache     ED Discharge Orders         Ordered    azithromycin (ZITHROMAX Z-PAK) 250 MG tablet     09/21/18 1313           Note:  This document was prepared using Dragon voice recognition software and may include unintentional dictation errors.    Johnn Hai, PA-C 09/21/18 1535    Harvest Dark, MD 09/22/18 1259

## 2019-03-15 ENCOUNTER — Other Ambulatory Visit: Payer: Self-pay | Admitting: Physician Assistant

## 2019-03-15 ENCOUNTER — Other Ambulatory Visit: Payer: Self-pay

## 2019-03-15 ENCOUNTER — Other Ambulatory Visit (HOSPITAL_COMMUNITY): Payer: Self-pay | Admitting: Physician Assistant

## 2019-03-15 ENCOUNTER — Ambulatory Visit
Admission: RE | Admit: 2019-03-15 | Discharge: 2019-03-15 | Disposition: A | Discharge status: Home / Self Care | Payer: Medicaid Other | Source: Ambulatory Visit | Attending: Physician Assistant | Admitting: Physician Assistant

## 2019-03-15 DIAGNOSIS — R1011 Right upper quadrant pain: Secondary | ICD-10-CM

## 2019-03-15 DIAGNOSIS — R109 Unspecified abdominal pain: Secondary | ICD-10-CM | POA: Diagnosis not present

## 2019-03-15 MED ORDER — IOHEXOL 300 MG/ML  SOLN
100.0000 mL | Freq: Once | INTRAMUSCULAR | Status: AC | PRN
  Administered 2019-03-15: 15:00:00 100 mL via INTRAVENOUS

## 2019-04-22 ENCOUNTER — Emergency Department: Payer: BLUE CROSS/BLUE SHIELD

## 2019-04-22 ENCOUNTER — Other Ambulatory Visit: Payer: Self-pay

## 2019-04-22 ENCOUNTER — Emergency Department
Admission: EM | Admit: 2019-04-22 | Discharge: 2019-04-22 | Disposition: A | Discharge status: Home / Self Care | Payer: BLUE CROSS/BLUE SHIELD | Attending: Emergency Medicine | Admitting: Emergency Medicine

## 2019-04-22 DIAGNOSIS — R739 Hyperglycemia, unspecified: Secondary | ICD-10-CM

## 2019-04-22 DIAGNOSIS — E1165 Type 2 diabetes mellitus with hyperglycemia: Secondary | ICD-10-CM | POA: Diagnosis not present

## 2019-04-22 DIAGNOSIS — Z794 Long term (current) use of insulin: Secondary | ICD-10-CM | POA: Diagnosis not present

## 2019-04-22 DIAGNOSIS — Z79899 Other long term (current) drug therapy: Secondary | ICD-10-CM | POA: Insufficient documentation

## 2019-04-22 DIAGNOSIS — Z91040 Latex allergy status: Secondary | ICD-10-CM | POA: Diagnosis not present

## 2019-04-22 DIAGNOSIS — R519 Headache, unspecified: Secondary | ICD-10-CM

## 2019-04-22 DIAGNOSIS — I519 Heart disease, unspecified: Secondary | ICD-10-CM | POA: Insufficient documentation

## 2019-04-22 LAB — BASIC METABOLIC PANEL
Anion gap: 11 (ref 5–15)
Anion gap: 7 (ref 5–15)
BUN: 9 mg/dL (ref 6–20)
BUN: 10 mg/dL (ref 6–20)
CO2: 23 mmol/L (ref 22–32)
CO2: 23 mmol/L (ref 22–32)
Calcium: 8.5 mg/dL — ABNORMAL LOW (ref 8.9–10.3)
Calcium: 7.5 mg/dL — ABNORMAL LOW (ref 8.9–10.3)
Chloride: 91 mmol/L — ABNORMAL LOW (ref 98–111)
Chloride: 99 mmol/L (ref 98–111)
Creatinine, Ser: 0.5 mg/dL (ref 0.44–1.00)
Creatinine, Ser: <0.3 mg/dL — ABNORMAL LOW (ref 0.44–1.00)
GFR calc Af Amer: >60 mL/min (ref >60)
GFR calc non Af Amer: >60 mL/min (ref >60)
Glucose, Bld: 452 mg/dL — ABNORMAL HIGH (ref 70–99)
Glucose, Bld: 171 mg/dL — ABNORMAL HIGH (ref 70–99)
Potassium: 4.3 mmol/L (ref 3.5–5.1)
Potassium: 3.4 mmol/L — ABNORMAL LOW (ref 3.5–5.1)
Sodium: 125 mmol/L — ABNORMAL LOW (ref 135–145)
Sodium: 129 mmol/L — ABNORMAL LOW (ref 135–145)

## 2019-04-22 LAB — CBC
HCT: 37.4 % (ref 36.0–46.0)
Hemoglobin: 13.2 g/dL (ref 12.0–15.0)
MCH: 30.8 pg (ref 26.0–34.0)
MCHC: 35.3 g/dL (ref 30.0–36.0)
MCV: 87.4 fL (ref 80.0–100.0)
Platelets: 265 10*3/uL (ref 150–400)
RBC: 4.28 MIL/uL (ref 3.87–5.11)
RDW: 12.8 % (ref 11.5–15.5)
WBC: 10 10*3/uL (ref 4.0–10.5)
nRBC: 0 % (ref 0.0–0.2)

## 2019-04-22 LAB — HEPATIC FUNCTION PANEL
ALT: 30 U/L (ref 0–44)
AST: 34 U/L (ref 15–41)
Albumin: 3.6 g/dL (ref 3.5–5.0)
Alkaline Phosphatase: 66 U/L (ref 38–126)
Bilirubin, Direct: <0.1 mg/dL (ref 0.0–0.2)
Total Bilirubin: 0.6 mg/dL (ref 0.3–1.2)
Total Protein: 5.4 g/dL — ABNORMAL LOW (ref 6.5–8.1)

## 2019-04-22 LAB — TROPONIN I (HIGH SENSITIVITY)
Troponin I (High Sensitivity): 4 ng/L (ref <18)
Troponin I (High Sensitivity): 4 ng/L (ref <18)

## 2019-04-22 LAB — GLUCOSE, CAPILLARY: Glucose-Capillary: 215 mg/dL — ABNORMAL HIGH (ref 70–99)

## 2019-04-22 LAB — LIPASE, BLOOD: Lipase: 29 U/L (ref 11–51)

## 2019-04-22 LAB — CK: Total CK: 93 U/L (ref 38–234)

## 2019-04-22 MED ORDER — SODIUM CHLORIDE 0.9 % IV BOLUS
1000.0000 mL | Freq: Once | INTRAVENOUS | Status: AC
  Administered 2019-04-22: 21:00:00 1000 mL via INTRAVENOUS

## 2019-04-22 MED ORDER — SODIUM CHLORIDE 0.9 % IV BOLUS
1000.0000 mL | Freq: Once | INTRAVENOUS | Status: AC
  Administered 2019-04-22: 20:00:00 1000 mL via INTRAVENOUS

## 2019-04-22 MED ORDER — METOCLOPRAMIDE HCL 5 MG/ML IJ SOLN
10.0000 mg | Freq: Once | INTRAMUSCULAR | Status: AC
  Administered 2019-04-22: 21:00:00 10 mg via INTRAVENOUS
  Filled 2019-04-22: qty 2

## 2019-04-22 MED ORDER — ACETAMINOPHEN 500 MG PO TABS
1000.0000 mg | ORAL_TABLET | Freq: Once | ORAL | Status: AC
  Administered 2019-04-22: 1000 mg via ORAL
  Filled 2019-04-22: qty 2

## 2019-04-22 MED ORDER — SODIUM CHLORIDE 0.9% FLUSH: 3.0000 mL | Freq: Once | INTRAVENOUS | Status: DC

## 2019-04-22 MED ORDER — DIPHENHYDRAMINE HCL 50 MG/ML IJ SOLN
12.5000 mg | Freq: Once | INTRAMUSCULAR | Status: AC
  Administered 2019-04-22: 21:00:00 12.5 mg via INTRAVENOUS
  Filled 2019-04-22: qty 1

## 2019-04-22 MED ORDER — INSULIN ASPART 100 UNIT/ML ~~LOC~~ SOLN
10.0000 [IU] | Freq: Once | SUBCUTANEOUS | Status: AC
  Administered 2019-04-22: 21:00:00 10 [IU] via SUBCUTANEOUS
  Filled 2019-04-22: qty 1

## 2019-04-22 NOTE — ED Notes (Signed)
Pt leaving for CT.  

## 2019-04-22 NOTE — ED Notes (Signed)
See triage note. Pt's significant other sitting on end of bed. Pt alert and laying calmly in bed. Appears uncomfortable. Reports generalized discomfort throughout body and HA for last few days. Pt holding hand to forehead when this RN walked up to bedside. States sometimes forgets to take her insulin or forgets to bring it with her on trips.

## 2019-04-22 NOTE — ED Triage Notes (Addendum)
Pt comes via POV from home with c/o generalized aches, headache and feels unsteady.  Pt denies any N/V/D.  Pt states SOB and chest pain. Pt states left sided chest pain with no radiation.    Pt also states she has not been taking her insulin and medications as prescribed. Pt has hx of diabetes.

## 2019-04-22 NOTE — ED Notes (Signed)
About 400cc of first bolus and 500cc of 2nd bolus remain. Pt attempting to sleep with blanket over her head.

## 2019-04-22 NOTE — ED Provider Notes (Signed)
[facility] EMERGENCY DEPARTMENT Provider Note   CSN: [id number] Arrival date & time: 04/22/19  1642     History Chief Complaint  Patient presents with  . Generalized Body Aches  . Headache    Sabrina Hanna is a 44 y.o. female history of diabetes, hypertriglyceridemia, here presenting with myalgias and confusion and headaches.  Patient states that for the last 2 to 3 days, she has had some diffuse myalgias.  She also states that she has been confused. She states that yesterday her sister went to visit her and she forgot her sister's name but eventually did remember it. She states that she has been having headaches for the last several days as well.  She states that her sugar is usually around 300s and has been running more elevated than usual .  She denies any fevers or vomiting or Covid exposure.  Denies any urinary symptoms.  The history is provided by the patient.       Past Medical History:  Diagnosis Date  . Diabetes mellitus without complication ([place])   . Hypertriglyceridemia   . Pancreatitis     Patient Active Problem List   Diagnosis Date Noted  . Pancreatitis 06/28/2017  . 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 Intentional metformin overdose ([place]) 04/08/2016  . Diabetes mellitus without complication ([place]) 18/56/3149    Past Surgical History:  Procedure Laterality Date  . ABDOMINAL HYSTERECTOMY    . LAPAROSCOPIC UNILATERAL SALPINGO OOPHERECTOMY Right      OB History   No obstetric history on file.     Family History  Problem Relation Age of Onset  . Diabetes Mellitus II Maternal Grandmother   . Diabetes Mellitus II Sister     Social History   Tobacco Use  . Smoking status: Never Smoker  . Smokeless tobacco: Never Used  Substance Use Topics  . Alcohol use: No  . Drug use: No    Home Medications Prior to Admission medications   Medication Sig Start Date End Date Taking?  Authorizing Provider  atorvastatin (LIPITOR) 20 MG tablet Take 20 mg by mouth at bedtime. 06/26/17   [provider]  azithromycin (ZITHROMAX Z-PAK) 250 MG tablet Take 2 tablets (500 mg) on  Day 1,  followed by 1 tablet (250 mg) once daily on Days 2 through 5. 09/21/18   Johnn Hai, PA-C  brompheniramine-pseudoephedrine-DM 30-2-10 MG/5ML syrup Take 5 mLs by mouth 4 (four) times daily as needed. 05/04/18   Sable Feil, PA-C  estradiol (ESTRACE) 0.1 MG/GM vaginal cream Place 1 g vaginally 3 (three) times a week. 05/24/18 05/24/19  Lannie Fields, PA-C  fenofibrate (TRICOR) 145 MG tablet Take 145 mg by mouth daily. 06/26/17   [provider]  fluconazole (DIFLUCAN) 150 MG tablet Take 1 tablet (150 mg total) by mouth daily. 05/22/18   Lannie Fields, PA-C  HUMALOG 100 UNIT/ML injection Inject 6 Units into the skin 3 (three) times daily. 06/26/17   [provider]  HYDROmorphone (DILAUDID) 2 MG tablet Take 1-2 tablets by mouth every 6 (six) hours as needed for severe pain. 06/26/17   [provider]  insulin aspart (NOVOLOG FLEXPEN) 100 UNIT/ML FlexPen Inject 15 Units into the skin 3 (three) times daily with meals. 03/04/18   Gregor Hams, MD  ketorolac (TORADOL) 10 MG tablet Take 1 tablet (10 mg total) by mouth every 6 (six) hours as needed. 05/04/18   Sable Feil, PA-C  LANTUS  100 UNIT/ML injection Inject 16 Units into the skin at bedtime. 06/26/17   [provider]  pantoprazole (PROTONIX) 40 MG tablet Take 40 mg by mouth daily. 04/10/17   [provider]  senna (SENOKOT) 8.6 MG TABS tablet Take 2 tablets by mouth 2 (two) times daily.     [provider]    Allergies    Latex and Penicillins  Review of Systems   Review of Systems  Neurological: Positive for headaches.  Psychiatric/Behavioral: Positive for confusion.  All other systems reviewed and are negative.   Physical Exam Updated Vital Signs BP 125/79   Pulse 100    Temp 98.1 F (36.7 C)   Resp 18   Ht 5' (1.524 m)   Wt 65.8 kg   SpO2 96%   BMI 28.32 kg/m   Physical Exam Vitals and nursing note reviewed.  HENT:     Head: Normocephalic.     Mouth/Throat:     Comments: MM slightly dry  Eyes:     Extraocular Movements: Extraocular movements intact.  Cardiovascular:     Rate and Rhythm: Normal rate.  Pulmonary:     Effort: Pulmonary effort is normal.     Breath sounds: Normal breath sounds.  Abdominal:     General: Bowel sounds are normal.     Palpations: Abdomen is soft.  Musculoskeletal:        General: Normal range of motion.     Cervical back: Normal range of motion and neck supple.  Skin:    General: Skin is warm.  Neurological:     Mental Status: She is alert and oriented to person, place, and time.     Cranial Nerves: No cranial nerve deficit or dysarthria.     Sensory: No sensory deficit.     Motor: No weakness.     Comments: A & O x 3, nl strength and sensation throughout   Psychiatric:        Mood and Affect: Mood normal.     ED Results / Procedures / Treatments   Labs (all labs ordered are listed, but only abnormal results are displayed) Labs Reviewed  BASIC METABOLIC PANEL - Abnormal; Notable for the following components:      Result Value   Sodium 125 (*)    Chloride 91 (*)    Glucose, Bld 452 (*)    Calcium 8.5 (*)    All other components within normal limits  CBC  URINALYSIS, COMPLETE (UACMP) WITH MICROSCOPIC  HEPATIC FUNCTION PANEL  CK  TROPONIN I (HIGH SENSITIVITY)  TROPONIN I (HIGH SENSITIVITY)    EKG EKG Interpretation  Date/Time:  Friday April 22 2019 17:48:52 EST Ventricular Rate:  99 PR Interval:  150 QRS Duration: 88 QT Interval:  360 QTC Calculation: 462 R Axis:   -46 Text Interpretation: Normal sinus rhythm Left axis deviation Abnormal ECG When compared with ECG of 03-Mar-2018 21:36, QRS axis Shifted left Confirmed by Richardean Canal [phone]) on 04/22/2019 8:30:46 PM   Radiology DG  Chest 2 View  Result Date: 04/22/2019 CLINICAL DATA:  Chest pain EXAM: CHEST - 2 VIEW COMPARISON:  05/04/2018 FINDINGS: The heart size and mediastinal contours are within normal limits. Both lungs are clear. The visualized skeletal structures are unremarkable. IMPRESSION: No active cardiopulmonary disease. Electronically Signed   By: Jasmine Pang M.D.   On: 04/22/2019 18:17    Procedures Procedures (including critical care time)  Medications Ordered in ED Medications  sodium chloride flush (NS) 0.9 % injection  3 mL (3 mLs Intravenous Not Given 04/22/19 2005)  sodium chloride 0.9 % bolus 1,000 mL (has no administration in time range)  insulin aspart (novoLOG) injection 10 Units (has no administration in time range)  metoCLOPramide (REGLAN) injection 10 mg (has no administration in time range)  diphenhydrAMINE (BENADRYL) injection 12.5 mg (has no administration in time range)  sodium chloride 0.9 % bolus 1,000 mL (1,000 mLs Intravenous New Bag/Given 04/22/19 2004)    ED Course  I have reviewed the triage vital signs and the nursing notes.  Pertinent labs & imaging results that were available during my care of the patient were reviewed by me and considered in my medical decision making (see chart for details).    MDM Rules/Calculators/A&P                      Young Brim is a 44 y.o. female here presenting with headache, confusion, hyperglycemia.  Patient has nonfocal neuro exam right now.  I will know if her mild confusion yesterday is secondary to migraine versus hyperglycemia .  Patient's blood sugar here is 450.  Will get labs to make sure she is not in DKA.  Will hydrate patient and give insulin.  Will get CT head as well.  11:00 PM Initial sodium was 125 with a glucose of 420.  Her anion gap is normal.  She was given 2 L normal saline bolus and her blood sugar came down to 170.  Her sodium went up to 129.  CT head is unremarkable.  Patient felt better and headache improved  with migraine cocktail.  This point I think she is stable for discharge and her symptoms likely secondary to hyperglycemia   Final Clinical Impression(s) / ED Diagnoses Final diagnoses:  None    Rx / DC Orders ED Discharge Orders    None       Charlynne Pander, MD 04/22/19 [phone]

## 2019-04-22 NOTE — ED Notes (Signed)
Will collect repeat BMP once NS boluses complete.

## 2019-04-22 NOTE — Discharge Instructions (Signed)
Stay hydrated  Your CT head is unremarkable. Your blood sugar went from 450 down to 170 today   Use your insulin as prescribed   See your doctor next week   Return to ER if you have vomiting, headaches, glucose > 600 or less than 60.

## 2019-04-24 DIAGNOSIS — IMO0002 Reserved for concepts with insufficient information to code with codable children: Secondary | ICD-10-CM | POA: Diagnosis present

## 2019-04-24 DIAGNOSIS — E1165 Type 2 diabetes mellitus with hyperglycemia: Secondary | ICD-10-CM | POA: Diagnosis present

## 2019-07-03 ENCOUNTER — Other Ambulatory Visit: Payer: Self-pay

## 2019-07-03 ENCOUNTER — Emergency Department: Payer: BLUE CROSS/BLUE SHIELD

## 2019-07-03 ENCOUNTER — Emergency Department
Admission: EM | Admit: 2019-07-03 | Discharge: 2019-07-03 | Disposition: A | Discharge status: Home / Self Care | Payer: BLUE CROSS/BLUE SHIELD | Attending: Emergency Medicine | Admitting: Emergency Medicine

## 2019-07-03 ENCOUNTER — Encounter: Payer: Self-pay | Admitting: Emergency Medicine

## 2019-07-03 DIAGNOSIS — S29012A Strain of muscle and tendon of back wall of thorax, initial encounter: Secondary | ICD-10-CM | POA: Diagnosis not present

## 2019-07-03 DIAGNOSIS — T148XXA Other injury of unspecified body region, initial encounter: Secondary | ICD-10-CM

## 2019-07-03 DIAGNOSIS — E119 Type 2 diabetes mellitus without complications: Secondary | ICD-10-CM | POA: Diagnosis not present

## 2019-07-03 DIAGNOSIS — Z79899 Other long term (current) drug therapy: Secondary | ICD-10-CM | POA: Diagnosis not present

## 2019-07-03 DIAGNOSIS — S56911A Strain of unspecified muscles, fascia and tendons at forearm level, right arm, initial encounter: Secondary | ICD-10-CM | POA: Insufficient documentation

## 2019-07-03 DIAGNOSIS — Y939 Activity, unspecified: Secondary | ICD-10-CM | POA: Insufficient documentation

## 2019-07-03 DIAGNOSIS — S46911A Strain of unspecified muscle, fascia and tendon at shoulder and upper arm level, right arm, initial encounter: Secondary | ICD-10-CM | POA: Diagnosis not present

## 2019-07-03 DIAGNOSIS — S56912A Strain of unspecified muscles, fascia and tendons at forearm level, left arm, initial encounter: Secondary | ICD-10-CM | POA: Insufficient documentation

## 2019-07-03 DIAGNOSIS — Z794 Long term (current) use of insulin: Secondary | ICD-10-CM | POA: Diagnosis not present

## 2019-07-03 DIAGNOSIS — Y999 Unspecified external cause status: Secondary | ICD-10-CM | POA: Diagnosis not present

## 2019-07-03 DIAGNOSIS — W500XXA Accidental hit or strike by another person, initial encounter: Secondary | ICD-10-CM | POA: Insufficient documentation

## 2019-07-03 DIAGNOSIS — Z91040 Latex allergy status: Secondary | ICD-10-CM | POA: Diagnosis not present

## 2019-07-03 DIAGNOSIS — Y929 Unspecified place or not applicable: Secondary | ICD-10-CM | POA: Diagnosis not present

## 2019-07-03 DIAGNOSIS — S46912A Strain of unspecified muscle, fascia and tendon at shoulder and upper arm level, left arm, initial encounter: Secondary | ICD-10-CM | POA: Insufficient documentation

## 2019-07-03 DIAGNOSIS — S161XXA Strain of muscle, fascia and tendon at neck level, initial encounter: Secondary | ICD-10-CM | POA: Diagnosis not present

## 2019-07-03 DIAGNOSIS — S199XXA Unspecified injury of neck, initial encounter: Secondary | ICD-10-CM | POA: Diagnosis present

## 2019-07-03 LAB — GLUCOSE, CAPILLARY
Glucose-Capillary: 324 mg/dL — ABNORMAL HIGH (ref 70–99)
Glucose-Capillary: 117 mg/dL — ABNORMAL HIGH (ref 70–99)

## 2019-07-03 MED ORDER — MORPHINE SULFATE (PF) 4 MG/ML IV SOLN: 4.0000 mg | Freq: Once | INTRAVENOUS | Status: DC

## 2019-07-03 MED ORDER — OXYCODONE-ACETAMINOPHEN 5-325 MG PO TABS
1.0000 | ORAL_TABLET | Freq: Once | ORAL | Status: AC
  Administered 2019-07-03: 1 via ORAL
  Filled 2019-07-03: qty 1

## 2019-07-03 MED ORDER — MORPHINE SULFATE (PF) 4 MG/ML IV SOLN
4.0000 mg | Freq: Once | INTRAVENOUS | Status: AC
  Administered 2019-07-03: 4 mg via INTRAVENOUS
  Filled 2019-07-03: qty 1

## 2019-07-03 MED ORDER — SODIUM CHLORIDE 0.9 % IV BOLUS
1000.0000 mL | Freq: Once | INTRAVENOUS | Status: AC
  Administered 2019-07-03: 1000 mL via INTRAVENOUS

## 2019-07-03 MED ORDER — BACLOFEN 10 MG PO TABS: 10.0000 mg | ORAL_TABLET | Freq: Every day | ORAL | 1 refills | Status: DC

## 2019-07-03 MED ORDER — ONDANSETRON HCL 4 MG/2ML IJ SOLN
4.0000 mg | Freq: Once | INTRAMUSCULAR | Status: AC
  Administered 2019-07-03: 4 mg via INTRAVENOUS
  Filled 2019-07-03: qty 2

## 2019-07-03 MED ORDER — LORAZEPAM 2 MG/ML IJ SOLN
0.5000 mg | Freq: Once | INTRAMUSCULAR | Status: AC
  Administered 2019-07-03: 0.5 mg via INTRAVENOUS
  Filled 2019-07-03: qty 1

## 2019-07-03 MED ORDER — LORAZEPAM 2 MG/ML IJ SOLN: 0.5000 mg | Freq: Once | INTRAMUSCULAR | Status: DC

## 2019-07-03 MED ORDER — TRAMADOL HCL 50 MG PO TABS: 50.0000 mg | ORAL_TABLET | Freq: Four times a day (QID) | ORAL | 0 refills | Status: DC | PRN

## 2019-07-03 NOTE — ED Notes (Signed)
Interpreter request placed 
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
## 2019-07-03 NOTE — Discharge Instructions (Signed)
Follow-up with your regular doctor if not improving in 1 week or you may see Dr. Rosita Kea at [facility].  Take medications as prescribed.  Apply ice to all areas.

## 2019-07-03 NOTE — ED Notes (Signed)
Xray called to get pt 
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
## 2019-10-11 ENCOUNTER — Other Ambulatory Visit: Payer: Self-pay

## 2019-10-11 ENCOUNTER — Emergency Department: Payer: BLUE CROSS/BLUE SHIELD

## 2019-10-11 DIAGNOSIS — E876 Hypokalemia: Secondary | ICD-10-CM | POA: Diagnosis present

## 2019-10-11 DIAGNOSIS — R0682 Tachypnea, not elsewhere classified: Secondary | ICD-10-CM | POA: Diagnosis not present

## 2019-10-11 DIAGNOSIS — K219 Gastro-esophageal reflux disease without esophagitis: Secondary | ICD-10-CM | POA: Diagnosis present

## 2019-10-11 DIAGNOSIS — E785 Hyperlipidemia, unspecified: Secondary | ICD-10-CM | POA: Diagnosis present

## 2019-10-11 DIAGNOSIS — E1165 Type 2 diabetes mellitus with hyperglycemia: Secondary | ICD-10-CM | POA: Diagnosis not present

## 2019-10-11 DIAGNOSIS — Z833 Family history of diabetes mellitus: Secondary | ICD-10-CM

## 2019-10-11 DIAGNOSIS — U071 COVID-19: Principal | ICD-10-CM | POA: Diagnosis present

## 2019-10-11 DIAGNOSIS — T380X5A Adverse effect of glucocorticoids and synthetic analogues, initial encounter: Secondary | ICD-10-CM | POA: Diagnosis not present

## 2019-10-11 DIAGNOSIS — J1282 Pneumonia due to coronavirus disease 2019: Secondary | ICD-10-CM | POA: Diagnosis present

## 2019-10-11 DIAGNOSIS — E781 Pure hyperglyceridemia: Secondary | ICD-10-CM | POA: Diagnosis present

## 2019-10-11 LAB — BASIC METABOLIC PANEL
Anion gap: 10 (ref 5–15)
BUN: 8 mg/dL (ref 6–20)
CO2: 22 mmol/L (ref 22–32)
Calcium: 8.7 mg/dL — ABNORMAL LOW (ref 8.9–10.3)
Chloride: 103 mmol/L (ref 98–111)
Creatinine, Ser: 0.35 mg/dL — ABNORMAL LOW (ref 0.44–1.00)
GFR calc Af Amer: >60 mL/min (ref >60)
GFR calc non Af Amer: >60 mL/min (ref >60)
Glucose, Bld: 183 mg/dL — ABNORMAL HIGH (ref 70–99)
Potassium: 3.3 mmol/L — ABNORMAL LOW (ref 3.5–5.1)
Sodium: 135 mmol/L (ref 135–145)

## 2019-10-11 LAB — CBC
HCT: 39.7 % (ref 36.0–46.0)
Hemoglobin: 13.1 g/dL (ref 12.0–15.0)
MCH: 29.8 pg (ref 26.0–34.0)
MCHC: 33 g/dL (ref 30.0–36.0)
MCV: 90.2 fL (ref 80.0–100.0)
Platelets: 174 10*3/uL (ref 150–400)
RBC: 4.4 MIL/uL (ref 3.87–5.11)
RDW: 13.6 % (ref 11.5–15.5)
WBC: 7.4 10*3/uL (ref 4.0–10.5)
nRBC: 0 % (ref 0.0–0.2)

## 2019-10-11 LAB — LACTIC ACID, PLASMA: Lactic Acid, Venous: 2.3 mmol/L (ref 0.5–1.9)

## 2019-10-11 LAB — TROPONIN I (HIGH SENSITIVITY): Troponin I (High Sensitivity): <2 ng/L (ref <18)

## 2019-10-11 NOTE — ED Triage Notes (Addendum)
Pt arrives via POV for reports of shob, cough, body aches and headaches since last Monday. Pt tested positive for covid this past Friday. Pt reports shob at rest and on exertion. Reports decreased appetite. Pt has been taking mucinex and an albuterol inhaler, benzonotate pills and an albuterol inhaler since 08/14. Pt with shob when speaking in complete sentences, skin warm and dry. PT states she took tylenol about 1 hour ago

## 2019-10-12 ENCOUNTER — Inpatient Hospital Stay
Admission: EM | Admit: 2019-10-12 | Discharge: 2019-10-16 | DRG: 177 | Disposition: A | Discharge status: Home / Self Care | Payer: BLUE CROSS/BLUE SHIELD | Attending: Family Medicine | Admitting: Family Medicine

## 2019-10-12 ENCOUNTER — Emergency Department: Payer: BLUE CROSS/BLUE SHIELD

## 2019-10-12 DIAGNOSIS — E119 Type 2 diabetes mellitus without complications: Secondary | ICD-10-CM

## 2019-10-12 DIAGNOSIS — R509 Fever, unspecified: Secondary | ICD-10-CM | POA: Diagnosis not present

## 2019-10-12 DIAGNOSIS — K219 Gastro-esophageal reflux disease without esophagitis: Secondary | ICD-10-CM | POA: Diagnosis present

## 2019-10-12 DIAGNOSIS — R0682 Tachypnea, not elsewhere classified: Secondary | ICD-10-CM

## 2019-10-12 DIAGNOSIS — T380X5A Adverse effect of glucocorticoids and synthetic analogues, initial encounter: Secondary | ICD-10-CM | POA: Diagnosis not present

## 2019-10-12 DIAGNOSIS — J1282 Pneumonia due to coronavirus disease 2019: Secondary | ICD-10-CM | POA: Diagnosis present

## 2019-10-12 DIAGNOSIS — U071 COVID-19: Secondary | ICD-10-CM | POA: Diagnosis present

## 2019-10-12 DIAGNOSIS — E876 Hypokalemia: Secondary | ICD-10-CM | POA: Diagnosis present

## 2019-10-12 DIAGNOSIS — R000 Tachycardia, unspecified: Secondary | ICD-10-CM | POA: Diagnosis not present

## 2019-10-12 DIAGNOSIS — E785 Hyperlipidemia, unspecified: Secondary | ICD-10-CM | POA: Diagnosis present

## 2019-10-12 DIAGNOSIS — E1165 Type 2 diabetes mellitus with hyperglycemia: Secondary | ICD-10-CM | POA: Diagnosis not present

## 2019-10-12 DIAGNOSIS — Z833 Family history of diabetes mellitus: Secondary | ICD-10-CM | POA: Diagnosis not present

## 2019-10-12 DIAGNOSIS — E781 Pure hyperglyceridemia: Secondary | ICD-10-CM | POA: Diagnosis present

## 2019-10-12 LAB — URINALYSIS, COMPLETE (UACMP) WITH MICROSCOPIC
Bacteria, UA: NONE SEEN
Bilirubin Urine: NEGATIVE
Glucose, UA: >=500 mg/dL — AB
Hgb urine dipstick: NEGATIVE
Ketones, ur: 20 mg/dL — AB
Leukocytes,Ua: NEGATIVE
Nitrite: NEGATIVE
Protein, ur: NEGATIVE mg/dL
Specific Gravity, Urine: 1.044 — ABNORMAL HIGH (ref 1.005–1.030)
pH: 5 (ref 5.0–8.0)

## 2019-10-12 LAB — LACTATE DEHYDROGENASE: LDH: 177 U/L (ref 98–192)

## 2019-10-12 LAB — PROCALCITONIN: Procalcitonin: <0.1 ng/mL

## 2019-10-12 LAB — GLUCOSE, CAPILLARY
Glucose-Capillary: 310 mg/dL — ABNORMAL HIGH (ref 70–99)
Glucose-Capillary: 412 mg/dL — ABNORMAL HIGH (ref 70–99)
Glucose-Capillary: 284 mg/dL — ABNORMAL HIGH (ref 70–99)

## 2019-10-12 LAB — BRAIN NATRIURETIC PEPTIDE: B Natriuretic Peptide: 11.8 pg/mL (ref 0.0–100.0)

## 2019-10-12 LAB — FERRITIN: Ferritin: 196 ng/mL (ref 11–307)

## 2019-10-12 LAB — POCT PREGNANCY, URINE: Preg Test, Ur: NEGATIVE

## 2019-10-12 LAB — SARS CORONAVIRUS 2 BY RT PCR (HOSPITAL ORDER, PERFORMED IN ~~LOC~~ HOSPITAL LAB): SARS Coronavirus 2: POSITIVE — AB

## 2019-10-12 LAB — LACTIC ACID, PLASMA: Lactic Acid, Venous: 2.7 mmol/L (ref 0.5–1.9)

## 2019-10-12 LAB — FIBRINOGEN: Fibrinogen: 551 mg/dL — ABNORMAL HIGH (ref 210–475)

## 2019-10-12 LAB — ABO/RH: ABO/RH(D): A POS

## 2019-10-12 LAB — FIBRIN DERIVATIVES D-DIMER (ARMC ONLY): Fibrin derivatives D-dimer (ARMC): 395.26 ng/mL (FEU) (ref 0.00–499.00)

## 2019-10-12 MED ORDER — ENOXAPARIN SODIUM 40 MG/0.4ML ~~LOC~~ SOLN
40.0000 mg | SUBCUTANEOUS | Status: DC
  Administered 2019-10-12 – 2019-10-16 (×4): 40 mg via SUBCUTANEOUS
  Filled 2019-10-12 (×4): qty 0.4

## 2019-10-12 MED ORDER — INSULIN DETEMIR 100 UNIT/ML ~~LOC~~ SOLN
0.1500 [IU]/kg | Freq: Two times a day (BID) | SUBCUTANEOUS | Status: DC
  Administered 2019-10-12 – 2019-10-13 (×3): 10 [IU] via SUBCUTANEOUS
  Filled 2019-10-12 (×5): qty 0.1

## 2019-10-12 MED ORDER — DEXAMETHASONE SODIUM PHOSPHATE 10 MG/ML IJ SOLN
6.0000 mg | INTRAMUSCULAR | Status: DC
  Administered 2019-10-13 – 2019-10-16 (×4): 6 mg via INTRAVENOUS
  Filled 2019-10-12 (×4): qty 1

## 2019-10-12 MED ORDER — SODIUM CHLORIDE 0.9% FLUSH: 3.0000 mL | INTRAVENOUS | Status: DC | PRN

## 2019-10-12 MED ORDER — DEXAMETHASONE SODIUM PHOSPHATE 10 MG/ML IJ SOLN
6.0000 mg | INTRAMUSCULAR | Status: DC
  Filled 2019-10-12: qty 1

## 2019-10-12 MED ORDER — SODIUM CHLORIDE 0.9 % IV BOLUS
1000.0000 mL | Freq: Once | INTRAVENOUS | Status: AC
  Administered 2019-10-12: 1000 mL via INTRAVENOUS

## 2019-10-12 MED ORDER — ASCORBIC ACID 500 MG PO TABS
500.0000 mg | ORAL_TABLET | Freq: Every day | ORAL | Status: DC
  Administered 2019-10-12 – 2019-10-16 (×5): 500 mg via ORAL
  Filled 2019-10-12 (×5): qty 1

## 2019-10-12 MED ORDER — SODIUM CHLORIDE 0.9% FLUSH
3.0000 mL | Freq: Two times a day (BID) | INTRAVENOUS | Status: DC
  Administered 2019-10-12 – 2019-10-16 (×7): 3 mL via INTRAVENOUS

## 2019-10-12 MED ORDER — ONDANSETRON HCL 4 MG/2ML IJ SOLN
4.0000 mg | Freq: Four times a day (QID) | INTRAMUSCULAR | Status: DC | PRN
  Administered 2019-10-14: 4 mg via INTRAVENOUS
  Filled 2019-10-12: qty 2

## 2019-10-12 MED ORDER — GABAPENTIN 100 MG PO CAPS
100.0000 mg | ORAL_CAPSULE | Freq: Three times a day (TID) | ORAL | Status: DC
  Administered 2019-10-12 – 2019-10-16 (×13): 100 mg via ORAL
  Filled 2019-10-12 (×15): qty 1

## 2019-10-12 MED ORDER — ONDANSETRON HCL 4 MG PO TABS: 4.0000 mg | ORAL_TABLET | Freq: Four times a day (QID) | ORAL | Status: DC | PRN

## 2019-10-12 MED ORDER — FENOFIBRATE 54 MG PO TABS
54.0000 mg | ORAL_TABLET | Freq: Every day | ORAL | Status: DC
  Administered 2019-10-12 – 2019-10-16 (×5): 54 mg via ORAL
  Filled 2019-10-12 (×5): qty 1

## 2019-10-12 MED ORDER — ALBUTEROL SULFATE HFA 108 (90 BASE) MCG/ACT IN AERS
2.0000 | INHALATION_SPRAY | Freq: Four times a day (QID) | RESPIRATORY_TRACT | Status: DC | PRN
  Filled 2019-10-12: qty 6.7

## 2019-10-12 MED ORDER — ATORVASTATIN CALCIUM 20 MG PO TABS
20.0000 mg | ORAL_TABLET | Freq: Every day | ORAL | Status: DC
  Administered 2019-10-12 – 2019-10-15 (×4): 20 mg via ORAL
  Filled 2019-10-12 (×4): qty 1

## 2019-10-12 MED ORDER — SODIUM CHLORIDE 0.9 % IV SOLN
250.0000 mL | INTRAVENOUS | Status: DC | PRN
  Administered 2019-10-16: 250 mL via INTRAVENOUS

## 2019-10-12 MED ORDER — ACETAMINOPHEN 500 MG PO TABS
1000.0000 mg | ORAL_TABLET | Freq: Once | ORAL | Status: AC
  Administered 2019-10-12: 1000 mg via ORAL
  Filled 2019-10-12: qty 2

## 2019-10-12 MED ORDER — DEXAMETHASONE SODIUM PHOSPHATE 10 MG/ML IJ SOLN
10.0000 mg | Freq: Once | INTRAMUSCULAR | Status: AC
  Administered 2019-10-12: 10 mg via INTRAVENOUS
  Filled 2019-10-12: qty 1

## 2019-10-12 MED ORDER — INSULIN ASPART 100 UNIT/ML ~~LOC~~ SOLN
18.0000 [IU] | Freq: Once | SUBCUTANEOUS | Status: AC
  Administered 2019-10-12: 18 [IU] via SUBCUTANEOUS
  Filled 2019-10-12: qty 1

## 2019-10-12 MED ORDER — POTASSIUM CHLORIDE CRYS ER 20 MEQ PO TBCR
40.0000 meq | EXTENDED_RELEASE_TABLET | Freq: Two times a day (BID) | ORAL | Status: AC
  Administered 2019-10-12 (×2): 40 meq via ORAL
  Filled 2019-10-12 (×2): qty 2

## 2019-10-12 MED ORDER — INSULIN ASPART 100 UNIT/ML ~~LOC~~ SOLN
0.0000 [IU] | Freq: Three times a day (TID) | SUBCUTANEOUS | Status: DC
  Administered 2019-10-12: 11 [IU] via SUBCUTANEOUS
  Administered 2019-10-13: 8 [IU] via SUBCUTANEOUS
  Administered 2019-10-13: 11 [IU] via SUBCUTANEOUS
  Administered 2019-10-13: 3 [IU] via SUBCUTANEOUS
  Administered 2019-10-14: 15 [IU] via SUBCUTANEOUS
  Administered 2019-10-14: 09:00:00 5 [IU] via SUBCUTANEOUS
  Administered 2019-10-14: 17:00:00 15 [IU] via SUBCUTANEOUS
  Administered 2019-10-15: 09:00:00 5 [IU] via SUBCUTANEOUS
  Administered 2019-10-15: 16:00:00 11 [IU] via SUBCUTANEOUS
  Administered 2019-10-15: 8 [IU] via SUBCUTANEOUS
  Administered 2019-10-16: 09:00:00 5 [IU] via SUBCUTANEOUS
  Administered 2019-10-16: 15 [IU] via SUBCUTANEOUS
  Filled 2019-10-12 (×12): qty 1

## 2019-10-12 MED ORDER — HYDROCOD POLST-CPM POLST ER 10-8 MG/5ML PO SUER
5.0000 mL | Freq: Two times a day (BID) | ORAL | Status: DC | PRN
  Administered 2019-10-14 – 2019-10-16 (×4): 5 mL via ORAL
  Filled 2019-10-12 (×4): qty 5

## 2019-10-12 MED ORDER — ACETAMINOPHEN 325 MG PO TABS
650.0000 mg | ORAL_TABLET | Freq: Four times a day (QID) | ORAL | Status: DC | PRN
  Administered 2019-10-12 – 2019-10-15 (×6): 650 mg via ORAL
  Filled 2019-10-12 (×6): qty 2

## 2019-10-12 MED ORDER — SODIUM CHLORIDE 0.9 % IV SOLN
200.0000 mg | Freq: Once | INTRAVENOUS | Status: AC
  Administered 2019-10-12: 200 mg via INTRAVENOUS
  Filled 2019-10-12: qty 200

## 2019-10-12 MED ORDER — IOHEXOL 350 MG/ML SOLN
100.0000 mL | Freq: Once | INTRAVENOUS | Status: AC | PRN
  Administered 2019-10-12: 100 mL via INTRAVENOUS

## 2019-10-12 MED ORDER — SODIUM CHLORIDE 0.9 % IV SOLN: 100.0000 mg | Freq: Every day | INTRAVENOUS | Status: DC

## 2019-10-12 MED ORDER — SODIUM CHLORIDE 0.9 % IV SOLN
100.0000 mg | Freq: Every day | INTRAVENOUS | Status: AC
  Administered 2019-10-13 – 2019-10-16 (×4): 100 mg via INTRAVENOUS
  Filled 2019-10-12 (×3): qty 20
  Filled 2019-10-12: qty 100

## 2019-10-12 MED ORDER — REMDESIVIR 100 MG IV SOLR: 200.0000 mg | Freq: Once | INTRAVENOUS | Status: DC

## 2019-10-12 MED ORDER — ZINC SULFATE 220 (50 ZN) MG PO CAPS
220.0000 mg | ORAL_CAPSULE | Freq: Every day | ORAL | Status: DC
  Administered 2019-10-12 – 2019-10-16 (×5): 220 mg via ORAL
  Filled 2019-10-12 (×5): qty 1

## 2019-10-12 MED ORDER — PANTOPRAZOLE SODIUM 40 MG PO TBEC
40.0000 mg | DELAYED_RELEASE_TABLET | Freq: Every day | ORAL | Status: DC
  Administered 2019-10-12 – 2019-10-16 (×5): 40 mg via ORAL
  Filled 2019-10-12 (×5): qty 1

## 2019-10-12 MED ORDER — ACETAMINOPHEN 500 MG PO TABS: 1000.0000 mg | ORAL_TABLET | Freq: Once | ORAL | Status: DC

## 2019-10-12 NOTE — ED Notes (Signed)
Pt resting in bed at this time. Denies further needs. Remains diaphoretic but temperature down at this time.

## 2019-10-12 NOTE — ED Notes (Signed)
Pt remains resting in NAD. Denies further needs.

## 2019-10-12 NOTE — H&P (Addendum)
History and Physical    Sabrina Hanna YHC:[id number] DOB: 12-Sep-1975 DOA: 10/12/2019  PCP: Leotis Shames, MD   Patient coming from: Home  I have personally briefly reviewed patient's old medical records in [facility]  Chief Complaint: Cough and shortness of breath  HPI: Sabrina Hanna is a 44 y.o. female with medical history significant for diabetes mellitus, pancreatitis and hypertriglyceridemia who presents to the emergency room for evaluation of nonproductive cough, myalgias, headache and exertional shortness of breath.  Patient has had symptoms since 10/03/19 and tested positive for COVID-19 on 10/07/19.  Her husband has been sick for the COVID-19 virus.  Patient has been taking Mucinex and albuterol inhaler as well as Tylenol without any improvement in his symptoms. She also complains of chest pain, chills and intermittent fever. She denies having any abdominal pain, no nausea, no vomiting, no diarrhea or urinary symptoms. Patient is unvaccinated Her vital signs showed a pulse rate of 109, respiratory rate of 18, T-max 100.101F, blood pressure 124/94. Labs revealed sodium of 135, potassium of 3.3, serum bicarb of 22, LDH of 177, ferritin of 196, fibrinogen 551, FDP 395. Chest x-ray reviewed by me shows no acute findings. CT angiogram of the chest showed multifocal areas of groundglass nodularity throughout both lungs consistent with an atypical infection in the setting of COVID-19 positivity.  No evidence of acute pulmonary embolism. Twelve-lead EKG reviewed by me shows sinus tachycardia.   ED Course: Patient is a 44 year old Hispanic female who presents to the ER for evaluation of fever, chills, nonproductive cough and exertional shortness of breath.  CT angiogram shows findings suggestive of COVID-19 pneumonia.  She will be admitted to the hospital for further evaluation   Review of Systems: As per HPI otherwise 10 point review of systems negative.    Past Medical  History:  Diagnosis Date  . Diabetes mellitus without complication (HCC)   . Hypertriglyceridemia   . Pancreatitis     Past Surgical History:  Procedure Laterality Date  . ABDOMINAL HYSTERECTOMY    . LAPAROSCOPIC UNILATERAL SALPINGO OOPHERECTOMY Right      reports that she has never smoked. She has never used smokeless tobacco. She reports that she does not drink alcohol and does not use drugs.  Allergies  Allergen Reactions  . Latex Rash  . Penicillin G Rash  . Penicillins Rash and Other (See Comments)    Has patient had a PCN reaction causing immediate rash, facial/tongue/throat swelling, SOB or lightheadedness with hypotension: No Has patient had a PCN reaction causing severe rash involving mucus membranes or skin necrosis: No Has patient had a PCN reaction that required hospitalization No Has patient had a PCN reaction occurring within the last 10 years: No If all of the above answers are "NO", then may proceed with Cephalosporin use.     Family History  Problem Relation Age of Onset  . Diabetes Mellitus II Maternal Grandmother   . Diabetes Mellitus II Sister      Prior to Admission medications   Medication Sig Start Date End Date Taking? Authorizing Provider  atorvastatin (LIPITOR) 20 MG tablet Take 20 mg by mouth at bedtime. 06/26/17  Yes [provider]  benzonatate (TESSALON) 200 MG capsule Take 200 mg by mouth 3 (three) times daily as needed. 10/08/19  Yes [provider]  esomeprazole (NEXIUM) 20 MG capsule Take 20 mg by mouth daily. 08/24/19  Yes [provider]  famotidine (PEPCID) 20 MG tablet Take 20 mg by mouth 2 (two) times daily.  07/04/19  Yes [provider]  fenofibrate (TRICOR) 145 MG tablet Take 145 mg by mouth daily. 06/26/17  Yes [provider]  gabapentin (NEURONTIN) 100 MG capsule Take 100 mg by mouth 3 (three) times daily. 08/24/19  Yes [provider]  insulin aspart (NOVOLOG FLEXPEN) 100 UNIT/ML  FlexPen Inject 15 Units into the skin 3 (three) times daily with meals. Patient taking differently: Inject 5-15 Units into the skin 3 (three) times daily with meals. Uses sliding scale 03/04/18  Yes Darci Current, MD  LANTUS 100 UNIT/ML injection Inject 16 Units into the skin at bedtime. 06/26/17  Yes [provider]  metFORMIN (GLUCOPHAGE) 500 MG tablet Take 1,000 mg by mouth 2 (two) times daily. 07/24/19  Yes [provider]  moxifloxacin (AVELOX) 400 MG tablet Take 400 mg by mouth daily. 10/08/19  Yes [provider]  rosuvastatin (CRESTOR) 40 MG tablet Take 40 mg by mouth daily. 09/10/19  Yes [provider]  albuterol (VENTOLIN HFA) 108 (90 Base) MCG/ACT inhaler Inhale 2 puffs into the lungs 4 (four) times daily as needed for wheezing or shortness of breath. 10/08/19   [provider]    Physical Exam: Vitals:   10/12/19 0500 10/12/19 0630 10/12/19 0730 10/12/19 0736  BP: 117/83 124/78 120/77   Pulse: (!) 118 (!) 102 98   Resp: (!) 39 (!) 25 (!) 27   Temp:    98.7 F (37.1 C)  TempSrc:    Oral  SpO2: 97% 98% 97%   Weight:      Height:         Vitals:   10/12/19 0500 10/12/19 0630 10/12/19 0730 10/12/19 0736  BP: 117/83 124/78 120/77   Pulse: (!) 118 (!) 102 98   Resp: (!) 39 (!) 25 (!) 27   Temp:    98.7 F (37.1 C)  TempSrc:    Oral  SpO2: 97% 98% 97%   Weight:      Height:        Constitutional: NAD, alert and oriented x 3.  Acutely ill-appearing Eyes: PERRL, lids and conjunctivae pallor ENMT: Mucous membranes are moist.  Neck: normal, supple, no masses, no thyromegaly Respiratory: clear to auscultation bilaterally, no wheezing, no crackles. Normal respiratory effort. No accessory muscle use.  Cardiovascular: Regular rate and rhythm, no murmurs / rubs / gallops. No extremity edema. 2+ pedal pulses. No carotid bruits.  Abdomen: no tenderness, no masses palpated. No hepatosplenomegaly. Bowel sounds positive.  Musculoskeletal:  no clubbing / cyanosis. No joint deformity upper and lower extremities.  Skin: no rashes, lesions, ulcers.  Neurologic: No gross focal neurologic deficit. Psychiatric: Normal mood and affect.   Labs on Admission: I have personally reviewed following labs and imaging studies  CBC: Recent Labs  Lab 10/11/19 1837  WBC 7.4  HGB 13.1  HCT 39.7  MCV 90.2  PLT 174   Basic Metabolic Panel: Recent Labs  Lab 10/11/19 1837  NA 135  K 3.3*  CL 103  CO2 22  GLUCOSE 183*  BUN 8  CREATININE 0.35*  CALCIUM 8.7*   GFR: Estimated Creatinine Clearance: 70.3 mL/min (A) (by C-G formula based on SCr of 0.35 mg/dL (L)). Liver Function Tests: No results for input(s): AST, ALT, ALKPHOS, BILITOT, PROT, ALBUMIN in the last 168 hours. No results for input(s): LIPASE, AMYLASE in the last 168 hours. No results for input(s): AMMONIA in the last 168 hours. Coagulation Profile: No results for input(s): INR, PROTIME in the last 168 hours. Cardiac  Enzymes: No results for input(s): CKTOTAL, CKMB, CKMBINDEX, TROPONINI in the last 168 hours. BNP (last 3 results) No results for input(s): PROBNP in the last 8760 hours. HbA1C: No results for input(s): HGBA1C in the last 72 hours. CBG: No results for input(s): GLUCAP in the last 168 hours. Lipid Profile: No results for input(s): CHOL, HDL, LDLCALC, TRIG, CHOLHDL, LDLDIRECT in the last 72 hours. Thyroid Function Tests: No results for input(s): TSH, T4TOTAL, FREET4, T3FREE, THYROIDAB in the last 72 hours. Anemia Panel: Recent Labs    10/12/19 0300  FERRITIN 196   Urine analysis:    Component Value Date/Time   COLORURINE YELLOW (A) 05/22/2018 1926   APPEARANCEUR CLEAR (A) 05/22/2018 1926   LABSPEC 1.026 05/22/2018 1926   PHURINE 6.0 05/22/2018 1926   GLUCOSEU >=500 (A) 05/22/2018 1926   HGBUR NEGATIVE 05/22/2018 1926   BILIRUBINUR NEGATIVE 05/22/2018 1926   KETONESUR NEGATIVE 05/22/2018 1926   PROTEINUR NEGATIVE 05/22/2018 1926   NITRITE  NEGATIVE 05/22/2018 1926   LEUKOCYTESUR NEGATIVE 05/22/2018 1926    Radiological Exams on Admission: DG Chest 2 View  Result Date: 10/11/2019 CLINICAL DATA:  44 year old female with history of shortness of breath, cough and body aches. EXAM: CHEST - 2 VIEW COMPARISON:  Chest x-ray 04/22/2019. FINDINGS: Lung volumes are normal. No consolidative airspace disease. No pleural effusions. No pneumothorax. No pulmonary nodule or mass noted. Pulmonary vasculature and the cardiomediastinal silhouette are within normal limits. IMPRESSION: No radiographic evidence of acute cardiopulmonary disease. Electronically Signed   By: Trudie Reed M.D.   On: 10/11/2019 19:23   CT Angio Chest PE W/Cm &/Or Wo Cm  Result Date: 10/12/2019 CLINICAL DATA:  Chest pain, fevers, COVID-19 positive, pleurisy EXAM: CT ANGIOGRAPHY CHEST WITH CONTRAST TECHNIQUE: Multidetector CT imaging of the chest was performed using the standard protocol during bolus administration of intravenous contrast. Multiplanar CT image reconstructions and MIPs were obtained to evaluate the vascular anatomy. CONTRAST:  OMNIPAQUE IOHEXOL 350 MG/ML SOLN COMPARISON:  Radiograph 10/11/2019 FINDINGS: Cardiovascular: Satisfactory opacification the pulmonary arteries to the segmental level. No pulmonary artery filling defects are identified. Central pulmonary arteries are normal caliber. Normal heart size. No pericardial effusion. The aorta is normal caliber. Shared origin of the brachiocephalic and left common carotid artery. The left vertebral artery arises directly from the aortic arch between the shared origin and the left subclavian artery. Proximal great vessels are otherwise unremarkable. No major venous abnormalities. Mediastinum/Nodes: Scattered low-attenuation subcentimeter mediastinal and hilar nodes are present. Favored to be reactive. No worrisome mediastinal, hilar or axillary adenopathy. No mediastinal fluid or gas. Normal thyroid gland and  thoracic inlet. No acute abnormality of the trachea or esophagus. Lungs/Pleura: Multifocal areas of ground-glass nodularity are present throughout both lungs. No pneumothorax or effusion. No convincing CT features of edema. Atelectatic changes are present dependently likely accentuated by imaging during exhalation. Upper Abdomen: Diffuse hepatic hypoattenuation compatible with hepatic steatosis. No acute abnormalities present in the visualized portions of the upper abdomen. Musculoskeletal: No acute osseous abnormality or suspicious osseous lesion. No worrisome chest wall lesions. Review of the MIP images confirms the above findings. IMPRESSION: 1. No evidence of acute pulmonary embolism. 2. Multifocal areas of ground-glass nodularity throughout both lungs, consistent with an atypical infection in the setting of COVID-19 positivity. 3. Hepatic steatosis. Electronically Signed   By: Kreg Shropshire M.D.   On: 10/12/2019 04:10    EKG: Independently reviewed.  Sinus tachycardia  Assessment/Plan Principal Problem:   Pneumonia due to COVID-19 virus Active Problems:  Diabetes mellitus without complication (HCC)     Pneumonia due to COVID-19 virus Patient presents to the ER for evaluation of exertional shortness of breath, nonproductive cough, intermittent fever and chills She tested positive for the COVID-19 virus on 0813/21 but had symptoms for about 4 days prior to testing positive Patient is unvaccinated Patient had a CT angiogram which showed multifocal areas of ground-glass nodularity throughout both lungs, consistent with an atypical infection in the setting of COVID-19 positivity. We will start patient on remdesivir per protocol Start patient on Decadron 6 mg IV daily Supportive care with antipyretics and vitamins   Diabetes mellitus Patient at risk of developing hyperglycemia Continue scheduled long-acting insulin as well as sliding scale coverage Maintain consistent carbohydrate  diet   Hypertriglyceridemia Continue statins and fibrates   DVT prophylaxis: Lovenox Code Status: Full Family Communication: Greater than 50% of time was spent discussing plan of care with patient at the bedside.  She verbalizes understanding and agrees with the plan. Disposition Plan: Back to previous home environment Consults called: None    Chrislyn Seedorf MD [facility]     10/12/2019, 8:28 AM

## 2019-10-12 NOTE — ED Notes (Signed)
Pt remains resting in NAD.  

## 2019-10-12 NOTE — Progress Notes (Signed)
Inpatient Diabetes Program Recommendations  AACE/ADA: New Consensus Statement on Inpatient Glycemic Control (2015)  Target Ranges:  Prepandial:   less than 140 mg/dL      Peak postprandial:   less than 180 mg/dL (1-2 hours)      Critically ill patients:  140 - 180 mg/dL   Lab Results  Component Value Date   GLUCAP 310 (H) 10/12/2019   HGBA1C 8.7 (H) 06/27/2017    Review of Glycemic Control Results for Sabrina Hanna, Sabrina Hanna (MRN [id number]) as of 10/12/2019 12:34  Ref. Range 10/12/2019 10:23  Glucose-Capillary Latest Ref Range: 70 - 99 mg/dL 423 (H)   Diabetes history: DM 2 Outpatient Diabetes medications:  Novolog 5-15 units tid with meals Lantus 16 units q HS Metformin 1000 mg bid Current orders for Inpatient glycemic control:  Levemir 10 units bid Novolog moderate tid with meals Decadron 6 mg daily  Inpatient Diabetes Program Recommendations:    Please consider adding Novolog meal coverage 4 units tid with meals.   Thanks  Beryl Meager, RN, BC-ADM Inpatient Diabetes Coordinator Pager [phone] (8a-5p)

## 2019-10-12 NOTE — ED Notes (Signed)
Pt given meal tray. MD messaged about CBG.

## 2019-10-12 NOTE — ED Notes (Signed)
Pt given meal tray and up to restroom. C/o headache.

## 2019-10-12 NOTE — ED Notes (Signed)
Pt states that she has had a cough, headache, and other covid symptoms since last week. Pt states her husband tested positive last week. Pt states she's had fevers.

## 2019-10-12 NOTE — ED Notes (Signed)
Pt ambulated independently to restroom. Cleaned with wipes due to sweat and new gown and sheets given.

## 2019-10-12 NOTE — ED Notes (Signed)
First set of cultures, purple, green, blue, grey, and pink tubes sent to lab by this RN.

## 2019-10-12 NOTE — ED Notes (Signed)
Pt ambulated in exam room short distance. C/o generalized pain and moaning with slightest effort to walk and move. HR 111 at rest and 133 when ambulating. O2 saturation maintained 98%. MD aware.

## 2019-10-12 NOTE — ED Provider Notes (Signed)
[facility] Emergency Department Provider Note   ____________________________________________   First MD Initiated Contact with Patient 10/12/19 0300     (approximate)  I have reviewed the triage vital signs and the nursing notes.   HISTORY  Chief Complaint Cough and Shortness of Breath  History obtained via Spanish interpreter  HPI Sabrina Hanna is a 44 y.o. female who presents to the ED from home with a chief complaint of cough, body aches, shortness of breath and headaches.  Patient's husband tested positive for COVID-19.  Patient and her daughter both tested positive last Friday.  Has been taking Mucinex and albuterol inhaler as well as cough medicine since 8/14 without significant relief of symptoms.  Tylenol 1 hour prior to arrival.  Complains of chest pain, shortness of breath, nonproductive cough, chills, fever and body aches.  Denies abdominal pain, nausea, vomiting or diarrhea.      Past Medical History:  Diagnosis Date  . Diabetes mellitus without complication (HCC)   . Hypertriglyceridemia   . Pancreatitis     Patient Active Problem List   Diagnosis Date Noted  . Pancreatitis 06/28/2017  . 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 Intentional metformin overdose (HCC) 04/08/2016  . Diabetes mellitus without complication (HCC) 04/08/2016    Past Surgical History:  Procedure Laterality Date  . ABDOMINAL HYSTERECTOMY    . LAPAROSCOPIC UNILATERAL SALPINGO OOPHERECTOMY Right     Prior to Admission medications   Medication Sig Start Date End Date Taking? Authorizing Provider  atorvastatin (LIPITOR) 20 MG tablet Take 20 mg by mouth at bedtime. 06/26/17   [provider]  azithromycin (ZITHROMAX Z-PAK) 250 MG tablet Take 2 tablets (500 mg) on  Day 1,  followed by 1 tablet (250 mg) once daily on Days 2 through 5. 09/21/18   Tommi Rumps, PA-C  baclofen (LIORESAL) 10 MG tablet Take 1  tablet (10 mg total) by mouth daily. 07/03/19 07/02/20  Fisher, Roselyn Bering, PA-C  brompheniramine-pseudoephedrine-DM 30-2-10 MG/5ML syrup Take 5 mLs by mouth 4 (four) times daily as needed. 05/04/18   Joni Reining, PA-C  fenofibrate (TRICOR) 145 MG tablet Take 145 mg by mouth daily. 06/26/17   [provider]  fluconazole (DIFLUCAN) 150 MG tablet Take 1 tablet (150 mg total) by mouth daily. 05/22/18   Orvil Feil, PA-C  HUMALOG 100 UNIT/ML injection Inject 6 Units into the skin 3 (three) times daily. 06/26/17   [provider]  HYDROmorphone (DILAUDID) 2 MG tablet Take 1-2 tablets by mouth every 6 (six) hours as needed for severe pain. 06/26/17   [provider]  insulin aspart (NOVOLOG FLEXPEN) 100 UNIT/ML FlexPen Inject 15 Units into the skin 3 (three) times daily with meals. 03/04/18   Darci Current, MD  ketorolac (TORADOL) 10 MG tablet Take 1 tablet (10 mg total) by mouth every 6 (six) hours as needed. 05/04/18   Joni Reining, PA-C  LANTUS 100 UNIT/ML injection Inject 16 Units into the skin at bedtime. 06/26/17   [provider]  pantoprazole (PROTONIX) 40 MG tablet Take 40 mg by mouth daily. 04/10/17   [provider]  senna (SENOKOT) 8.6 MG TABS tablet Take 2 tablets by mouth 2 (two) times daily.     [provider]  traMADol (ULTRAM) 50 MG tablet Take 1 tablet (50 mg total) by mouth every 6 (six) hours as needed. 07/03/19   Faythe Ghee, PA-C    Allergies Latex and  Penicillins  Family History  Problem Relation Age of Onset  . Diabetes Mellitus II Maternal Grandmother   . Diabetes Mellitus II Sister     Social History Social History   Tobacco Use  . Smoking status: Never Smoker  . Smokeless tobacco: Never Used  Vaping Use  . Vaping Use: Never used  Substance Use Topics  . Alcohol use: No  . Drug use: No    Review of Systems  Constitutional: Positive for fever/chills Eyes: No visual changes. ENT: No sore  throat. Cardiovascular: Positive for chest pain. Respiratory: Positive for cough and shortness of breath. Gastrointestinal: No abdominal pain.  No nausea, no vomiting.  No diarrhea.  No constipation. Genitourinary: Negative for dysuria. Musculoskeletal: Negative for back pain. Skin: Negative for rash. Neurological: Negative for headaches, focal weakness or numbness.   ____________________________________________   PHYSICAL EXAM:  VITAL SIGNS: ED Triage Vitals [10/11/19 1811]  Enc Vitals Group     BP (!) 124/94     Pulse Rate (!) 109     Resp 18     Temp 100.3 F (37.9 C)     Temp Source Oral     SpO2 98 %     Weight 140 lb (63.5 kg)     Height 4\' 9"  (1.448 m)     Head Circumference      Peak Flow      Pain Score 0     Pain Loc      Pain Edu?      Excl. in GC?     Constitutional: Alert and oriented.  Tired appearing and in mild acute distress. Eyes: Conjunctivae are normal. PERRL. EOMI. Head: Atraumatic. Nose: No congestion/rhinnorhea. Mouth/Throat: Mucous membranes are mildly dry.   Neck: No stridor.   Cardiovascular: Tachycardic rate, regular rhythm. Grossly normal heart sounds.  Good peripheral circulation. Respiratory: Normal respiratory effort.  No retractions. Lungs CTAB. Gastrointestinal: Soft and nontender. No distention. No abdominal bruits. No CVA tenderness. Musculoskeletal: No lower extremity tenderness nor edema.  No joint effusions. Neurologic:  Normal speech and language. No gross focal neurologic deficits are appreciated.  Skin:  Skin is warm, dry and intact. No rash noted. Psychiatric: Mood and affect are normal. Speech and behavior are normal.  ____________________________________________   LABS (all labs ordered are listed, but only abnormal results are displayed)  Labs Reviewed  SARS CORONAVIRUS 2 BY RT PCR (HOSPITAL ORDER, PERFORMED IN [facility]) - Abnormal; Notable for the following components:      Result Value   SARS  Coronavirus 2 POSITIVE (*)    All other components within normal limits  BASIC METABOLIC PANEL - Abnormal; Notable for the following components:   Potassium 3.3 (*)    Glucose, Bld 183 (*)    Creatinine, Ser 0.35 (*)    Calcium 8.7 (*)    All other components within normal limits  LACTIC ACID, PLASMA - Abnormal; Notable for the following components:   Lactic Acid, Venous 2.3 (*)    All other components within normal limits  LACTIC ACID, PLASMA - Abnormal; Notable for the following components:   Lactic Acid, Venous 2.7 (*)    All other components within normal limits  CULTURE, BLOOD (ROUTINE X 2)  CULTURE, BLOOD (ROUTINE X 2)  URINE CULTURE  CBC  PROCALCITONIN  URINALYSIS, COMPLETE (UACMP) WITH MICROSCOPIC  HIV ANTIBODY (ROUTINE TESTING W REFLEX)  BRAIN NATRIURETIC PEPTIDE  C-REACTIVE PROTEIN  FIBRIN DERIVATIVES D-DIMER ([facility] ONLY)  FERRITIN  FIBRINOGEN  HEPATITIS B  SURFACE ANTIGEN  LACTATE DEHYDROGENASE  POC URINE PREG, ED  ABO/RH  TROPONIN I (HIGH SENSITIVITY)   ____________________________________________  EKG  ED ECG REPORT I, Earnestine Tuohey J, the attending physician, personally viewed and interpreted this ECG.   Date: 10/12/2019  EKG Time: 1825  Rate: 101  Rhythm: sinus tachycardia  Axis: Normal  Intervals:none  ST&T Change: Nonspecific  ____________________________________________  RADIOLOGY  ED MD interpretation: No acute cardiopulmonary process; no PE, CT consistent with COVID-19 pneumonia  Official radiology report(s): DG Chest 2 View  Result Date: 10/11/2019 CLINICAL DATA:  44 year old female with history of shortness of breath, cough and body aches. EXAM: CHEST - 2 VIEW COMPARISON:  Chest x-ray 04/22/2019. FINDINGS: Lung volumes are normal. No consolidative airspace disease. No pleural effusions. No pneumothorax. No pulmonary nodule or mass noted. Pulmonary vasculature and the cardiomediastinal silhouette are within normal limits. IMPRESSION: No  radiographic evidence of acute cardiopulmonary disease. Electronically Signed   By: Trudie Reed M.D.   On: 10/11/2019 19:23   CT Angio Chest PE W/Cm &/Or Wo Cm  Result Date: 10/12/2019 CLINICAL DATA:  Chest pain, fevers, COVID-19 positive, pleurisy EXAM: CT ANGIOGRAPHY CHEST WITH CONTRAST TECHNIQUE: Multidetector CT imaging of the chest was performed using the standard protocol during bolus administration of intravenous contrast. Multiplanar CT image reconstructions and MIPs were obtained to evaluate the vascular anatomy. CONTRAST:  OMNIPAQUE IOHEXOL 350 MG/ML SOLN COMPARISON:  Radiograph 10/11/2019 FINDINGS: Cardiovascular: Satisfactory opacification the pulmonary arteries to the segmental level. No pulmonary artery filling defects are identified. Central pulmonary arteries are normal caliber. Normal heart size. No pericardial effusion. The aorta is normal caliber. Shared origin of the brachiocephalic and left common carotid artery. The left vertebral artery arises directly from the aortic arch between the shared origin and the left subclavian artery. Proximal great vessels are otherwise unremarkable. No major venous abnormalities. Mediastinum/Nodes: Scattered low-attenuation subcentimeter mediastinal and hilar nodes are present. Favored to be reactive. No worrisome mediastinal, hilar or axillary adenopathy. No mediastinal fluid or gas. Normal thyroid gland and thoracic inlet. No acute abnormality of the trachea or esophagus. Lungs/Pleura: Multifocal areas of ground-glass nodularity are present throughout both lungs. No pneumothorax or effusion. No convincing CT features of edema. Atelectatic changes are present dependently likely accentuated by imaging during exhalation. Upper Abdomen: Diffuse hepatic hypoattenuation compatible with hepatic steatosis. No acute abnormalities present in the visualized portions of the upper abdomen. Musculoskeletal: No acute osseous abnormality or suspicious osseous  lesion. No worrisome chest wall lesions. Review of the MIP images confirms the above findings. IMPRESSION: 1. No evidence of acute pulmonary embolism. 2. Multifocal areas of ground-glass nodularity throughout both lungs, consistent with an atypical infection in the setting of COVID-19 positivity. 3. Hepatic steatosis. Electronically Signed   By: Kreg Shropshire M.D.   On: 10/12/2019 04:10    ____________________________________________   PROCEDURES  Procedure(s) performed (including Critical Care):  .1-3 Lead EKG Interpretation Performed by: Irean Hong, MD Authorized by: Irean Hong, MD     Interpretation: abnormal     ECG rate:  105   ECG rate assessment: tachycardic     Rhythm: sinus tachycardia     Ectopy: none     Conduction: normal   Comments:     Patient placed on cardiac monitor to evaluate for arrhythmias    CRITICAL CARE Performed by: Irean Hong   Total critical care time: 30 minutes  Critical care time was exclusive of separately billable procedures and treating other patients.  Critical care was  necessary to treat or prevent imminent or life-threatening deterioration.  Critical care was time spent personally by me on the following activities: development of treatment plan with patient and/or surrogate as well as nursing, discussions with consultants, evaluation of patient's response to treatment, examination of patient, obtaining history from patient or surrogate, ordering and performing treatments and interventions, ordering and review of laboratory studies, ordering and review of radiographic studies, pulse oximetry and re-evaluation of patient's condition.  ____________________________________________   INITIAL IMPRESSION / ASSESSMENT AND PLAN / ED COURSE  As part of my medical decision making, I reviewed the following data within the electronic MEDICAL RECORD NUMBER Nursing notes reviewed and incorporated, Labs reviewed, EKG interpreted, Old chart reviewed,  Radiograph reviewed and Notes from prior ED visits     Sabrina Hanna was evaluated in Emergency Department on 10/12/2019 for the symptoms described in the history of present illness. She was evaluated in the context of the global COVID-19 pandemic, which necessitated consideration that the patient might be at risk for infection with the SARS-CoV-2 virus that causes COVID-19. Institutional protocols and algorithms that pertain to the evaluation of patients at risk for COVID-19 are in a state of rapid change based on information released by regulatory bodies including the CDC and federal and state organizations. These policies and algorithms were followed during the patient's care in the ED.    44 year old female with COVID-19 virus presenting with shortness of breath Differential includes, but is not limited to, viral syndrome, bronchitis including COPD exacerbation, pneumonia, reactive airway disease including asthma, CHF including exacerbation with or without pulmonary/interstitial edema, pneumothorax, ACS, thoracic trauma, and pulmonary embolism.  Laboratory results remarkable for mild hypokalemia, elevated lactic acid with normal procalcitonin.  Code sepsis was not initiated for this reason as well as clinically patient appears more consistent with COVID-19 pneumonia rather than sepsis.  Chest x-ray unremarkable.  Will obtain CTA chest to evaluate for PE.  Administer IV fluids, Decadron; will repeat lactic acid.   Clinical Course as of Oct 11 525  Wed Oct 12, 2019  7510 Patient ambulated on room air; became tachycardic to the 130s and tachypneic to 38.  Temperature 102.3 F.  Will start IV Remdesivir.  Will discuss with hospitalist services for admission.   [JS]    Clinical Course User Index [JS] Irean Hong, MD     ____________________________________________   FINAL CLINICAL IMPRESSION(S) / ED DIAGNOSES  Final diagnoses:  Pneumonia due to COVID-19 virus  Tachycardia   Tachypnea  Fever, unspecified fever cause     ED Discharge Orders    None       Note:  This document was prepared using Dragon voice recognition software and may include unintentional dictation errors.   Irean Hong, MD 10/12/19 [phone]

## 2019-10-12 NOTE — ED Notes (Signed)
Pt resting in NAD at this time.

## 2019-10-12 NOTE — Progress Notes (Signed)
Remdesivir - Pharmacy Brief Note     A/P:  Remdesivir 200 mg IVPB once followed by 100 mg IVPB daily x 4 days.   Valrie Hart, PharmD Clinical Pharmacist  10/12/2019 5:21 AM

## 2019-10-12 NOTE — ED Notes (Signed)
Pt to ct 

## 2019-10-13 ENCOUNTER — Other Ambulatory Visit: Payer: Self-pay

## 2019-10-13 DIAGNOSIS — R509 Fever, unspecified: Secondary | ICD-10-CM

## 2019-10-13 DIAGNOSIS — R000 Tachycardia, unspecified: Secondary | ICD-10-CM

## 2019-10-13 LAB — CBC WITH DIFFERENTIAL/PLATELET
Abs Immature Granulocytes: 0.02 10*3/uL (ref 0.00–0.07)
Basophils Absolute: 0 10*3/uL (ref 0.0–0.1)
Basophils Relative: 0 %
Eosinophils Absolute: 0 10*3/uL (ref 0.0–0.5)
Eosinophils Relative: 0 %
HCT: 37.5 % (ref 36.0–46.0)
Hemoglobin: 12.4 g/dL (ref 12.0–15.0)
Immature Granulocytes: 0 %
Lymphocytes Relative: 27 %
Lymphs Abs: 1.9 10*3/uL (ref 0.7–4.0)
MCH: 29.9 pg (ref 26.0–34.0)
MCHC: 33.1 g/dL (ref 30.0–36.0)
MCV: 90.4 fL (ref 80.0–100.0)
Monocytes Absolute: 0.4 10*3/uL (ref 0.1–1.0)
Monocytes Relative: 6 %
Neutro Abs: 4.7 10*3/uL (ref 1.7–7.7)
Neutrophils Relative %: 67 %
Platelets: 159 10*3/uL (ref 150–400)
RBC: 4.15 MIL/uL (ref 3.87–5.11)
RDW: 13.6 % (ref 11.5–15.5)
WBC: 7.1 10*3/uL (ref 4.0–10.5)
nRBC: 0 % (ref 0.0–0.2)

## 2019-10-13 LAB — GLUCOSE, CAPILLARY
Glucose-Capillary: 169 mg/dL — ABNORMAL HIGH (ref 70–99)
Glucose-Capillary: 291 mg/dL — ABNORMAL HIGH (ref 70–99)
Glucose-Capillary: 467 mg/dL — ABNORMAL HIGH (ref 70–99)

## 2019-10-13 LAB — COMPREHENSIVE METABOLIC PANEL
ALT: 56 U/L — ABNORMAL HIGH (ref 0–44)
AST: 41 U/L (ref 15–41)
Albumin: 3.9 g/dL (ref 3.5–5.0)
Alkaline Phosphatase: 65 U/L (ref 38–126)
Anion gap: 11 (ref 5–15)
BUN: 10 mg/dL (ref 6–20)
CO2: 23 mmol/L (ref 22–32)
Calcium: 8.7 mg/dL — ABNORMAL LOW (ref 8.9–10.3)
Chloride: 103 mmol/L (ref 98–111)
Creatinine, Ser: 0.53 mg/dL (ref 0.44–1.00)
GFR calc Af Amer: >60 mL/min (ref >60)
GFR calc non Af Amer: >60 mL/min (ref >60)
Glucose, Bld: 207 mg/dL — ABNORMAL HIGH (ref 70–99)
Potassium: 3.6 mmol/L (ref 3.5–5.1)
Sodium: 137 mmol/L (ref 135–145)
Total Bilirubin: 0.6 mg/dL (ref 0.3–1.2)
Total Protein: 7.4 g/dL (ref 6.5–8.1)

## 2019-10-13 LAB — MAGNESIUM: Magnesium: 1.9 mg/dL (ref 1.7–2.4)

## 2019-10-13 LAB — PHOSPHORUS: Phosphorus: 3.1 mg/dL (ref 2.5–4.6)

## 2019-10-13 LAB — FIBRIN DERIVATIVES D-DIMER (ARMC ONLY): Fibrin derivatives D-dimer (ARMC): 259.44 ng/mL (FEU) (ref 0.00–499.00)

## 2019-10-13 LAB — FERRITIN: Ferritin: 204 ng/mL (ref 11–307)

## 2019-10-13 LAB — HEPATITIS B SURFACE ANTIGEN: Hepatitis B Surface Ag: NONREACTIVE

## 2019-10-13 LAB — URINE CULTURE: Culture: NO GROWTH

## 2019-10-13 MED ORDER — INSULIN DETEMIR 100 UNIT/ML ~~LOC~~ SOLN
15.0000 [IU] | Freq: Two times a day (BID) | SUBCUTANEOUS | Status: DC
  Administered 2019-10-13 – 2019-10-16 (×6): 15 [IU] via SUBCUTANEOUS
  Filled 2019-10-13 (×7): qty 0.15

## 2019-10-13 NOTE — ED Notes (Signed)
Pt given breakfast tray

## 2019-10-13 NOTE — ED Notes (Signed)
Pt given orange juice and ice pop per Sue Lush, Charity fundraiser.

## 2019-10-13 NOTE — Progress Notes (Signed)
Inpatient Diabetes Program Recommendations  AACE/ADA: New Consensus Statement on Inpatient Glycemic Control (2015)  Target Ranges:  Prepandial:   less than 140 mg/dL      Peak postprandial:   less than 180 mg/dL (1-2 hours)      Critically ill patients:  140 - 180 mg/dL   Results for JAZALYN, MONDOR (MRN [id number]) as of 10/13/2019 13:46  Ref. Range 10/12/2019 10:23 10/12/2019 16:21 10/12/2019 21:59 10/13/2019 08:26 10/13/2019 13:25  Glucose-Capillary Latest Ref Range: 70 - 99 mg/dL 696 (H)  11 units NOVOLOG +  10 units LEVEMIR 412 (H)  18 units NOVOLOG  284 (H)     10 units LEVEMIR 169 (H)  3 units NOVOLOG given at 10:30am +  10 units LEVEMIR 291 (H)    Home DM Meds: Lantus 16 units QHS       Novolog 5-15 units TID per SSI       Metformin 1000 mg BID   Current Orders: Levemir 10 units BID      Novolog Moderate Correction Scale/ SSI (0-15 units) TID AC     Getting Decadron 6 mg Daily   MD- Please consider adding Novolog Meal Coverage to current Inpatient Insulin regimen:  Novolog 6 units TID with meals  (Please add the following Hold Parameters: Hold if pt eats <50% of meal, Hold if pt NPO)     --Will follow patient during hospitalization--  Ambrose Finland RN, MSN, CDE Diabetes Coordinator Inpatient Glycemic Control Team Team Pager: [phone] (8a-5p)

## 2019-10-13 NOTE — Progress Notes (Signed)
[facility]  PROGRESS NOTE  Sabrina Hanna ZOX:[id number] DOB: [birth year] DOA: 10/12/2019 PCP: Leotis Shames, MD   Brief HPI:   44 year old female with medical history of diabetes mellitus type 2, pancreatitis, hypertriglyceridemia who came to ED for evaluation of nonproductive cough, myalgia, headache, exertional shortness of breath.  Patient says that she had symptoms since 10/03/2019 and tested positive for COVID-19 on 10/07/2019.  Her husband also has been sick with COVID-19 virus.  She has been taking Mucinex and albuterol inhaler.  Patient was tachycardic with pulse 109, respiration 18/min, T-max 100.3.  Blood pressure 124/94.  CTA chest showed multifocal areas of groundglass nodularity throughout the lungs consistent with atypical infection in the setting of COVID-19 positivity.  No evidence of acute pulmonary embolism.    Subjective   Patient seen and examined, currently not requiring oxygen.  O2 sats 97% on room air.  Denies shortness of breath.   Assessment/Plan:     1. Pneumonia due to COVID-19 virus-patient started on remdesivir per pharmacy, Decadron 6 mg IV daily.  She is currently not requiring oxygen.  Follow CRP, ferritin in a.m.  Continue vitamin C, zinc sulfate. 2. Diabetes mellitus type 2-blood glucose is mildly elevated.  Likely from Decadron as above.  Will add NovoLog 2 units with meal coverage.  Continue sliding scale insulin with NovoLog, will change Levemir from 10 units subcu twice daily to 15 units subcu twice daily.   3. Hyperlipidemia-continue atorvastatin. 4. GERD-continue pantoprazole.    COVID-19 Labs  Recent Labs    10/12/19 0300 10/13/19 0645  FERRITIN 196 204  LDH 177  --     Lab Results  Component Value Date   SARSCOV2NAA POSITIVE (A) 10/11/2019     Scheduled medications:   . vitamin C  500 mg Oral Daily  . atorvastatin  20 mg Oral QHS  . dexamethasone (DECADRON) injection  6 mg Intravenous Q24H  . enoxaparin (LOVENOX)  injection  40 mg Subcutaneous Q24H  . fenofibrate  54 mg Oral Daily  . gabapentin  100 mg Oral TID  . insulin aspart  0-15 Units Subcutaneous TID WC  . insulin detemir  0.15 Units/kg Subcutaneous BID  . pantoprazole  40 mg Oral Daily  . sodium chloride flush  3 mL Intravenous Q12H  . zinc sulfate  220 mg Oral Daily         CBG: Recent Labs  Lab 10/12/19 1023 10/12/19 1621 10/12/19 2159 10/13/19 0826 10/13/19 1325  GLUCAP 310* 412* 284* 169* 291*    SpO2: 97 %    CBC: Recent Labs  Lab 10/11/19 1837 10/13/19 0645  WBC 7.4 7.1  NEUTROABS  --  4.7  HGB 13.1 12.4  HCT 39.7 37.5  MCV 90.2 90.4  PLT 174 159    Basic Metabolic Panel: Recent Labs  Lab 10/11/19 1837 10/13/19 0645  NA 135 137  K 3.3* 3.6  CL 103 103  CO2 22 23  GLUCOSE 183* 207*  BUN 8 10  CREATININE 0.35* 0.53  CALCIUM 8.7* 8.7*  MG  --  1.9  PHOS  --  3.1     Liver Function Tests: Recent Labs  Lab 10/13/19 0645  AST 41  ALT 56*  ALKPHOS 65  BILITOT 0.6  PROT 7.4  ALBUMIN 3.9     Antibiotics: Anti-infectives (From admission, onward)   Start     Dose/Rate Route Frequency Ordered Stop   10/13/19 1000  remdesivir 100 mg in sodium chloride 0.9 % 100 mL IVPB       "  Followed by" Linked Group Details   100 mg 200 mL/hr over 30 Minutes Intravenous Daily 10/12/19 0511 10/17/19 0959   10/13/19 1000  remdesivir 100 mg in sodium chloride 0.9 % 100 mL IVPB  Status:  Discontinued       "Followed by" Linked Group Details   100 mg 200 mL/hr over 30 Minutes Intravenous Daily 10/12/19 0844 10/12/19 0858   10/12/19 0845  remdesivir 200 mg in sodium chloride 0.9% 250 mL IVPB  Status:  Discontinued       "Followed by" Linked Group Details   200 mg 580 mL/hr over 30 Minutes Intravenous Once 10/12/19 0844 10/12/19 0858   10/12/19 0630  remdesivir 200 mg in sodium chloride 0.9% 250 mL IVPB       "Followed by" Linked Group Details   200 mg 580 mL/hr over 30 Minutes Intravenous Once 10/12/19 0511  10/12/19 0813       DVT prophylaxis: Lovenox  Code Status: Full code  Family Communication: No family at bedside    Status is: Inpatient  Dispo: The patient is from: Home              Anticipated d/c is to: Home              Anticipated d/c date is: 10/17/2019              Patient currently not medically stable for discharge  Barrier to discharge-COVID-19 pneumonia          Consultants:    Procedures:     Objective   Vitals:   10/13/19 1230 10/13/19 1300 10/13/19 1330 10/13/19 1610  BP:    (!) 112/59  Pulse: (!) 104 97 95 (!) 109  Resp: (!) 22 (!) 37 (!) 32 20  Temp:      TempSrc:      SpO2: 96% 95% 96% 97%  Weight:      Height:        Intake/Output Summary (Last 24 hours) at 10/13/2019 1635 Last data filed at 10/13/2019 1137 Gross per 24 hour  Intake 100 ml  Output --  Net 100 ml    No intake/output data recorded.  Filed Weights   10/11/19 1811 10/11/19 2341  Weight: 63.5 kg 63.5 kg    Physical Examination:    General: Appears in no acute distress  Cardiovascular: S1-S2, regular, no murmur auscultated  Respiratory: Clear to auscultation bilaterally, no wheezing or crackles auscultated  Abdomen: Abdomen is soft, nontender, no organomegaly  Extremities: No edema in the lower extremities  Neurologic: Alert, oriented x3, intact insight and judgment     Data Reviewed:   Recent Results (from the past 240 hour(s))  SARS Coronavirus 2 by RT PCR (hospital order, performed in [facility] hospital lab) Nasopharyngeal Nasopharyngeal Swab     Status: Abnormal   Collection Time: 10/11/19 11:39 PM   Specimen: Nasopharyngeal Swab  Result Value Ref Range Status   SARS Coronavirus 2 POSITIVE (A) NEGATIVE Final    Comment: CRITICAL RESULT CALLED TO, READ BACK BY AND VERIFIED WITH: LISA THOMPSON @ 0102 ON 10/12/2019 [facility] (NOTE) SARS-CoV-2 target nucleic acids are DETECTED  SARS-CoV-2 RNA is generally detectable in upper respiratory specimens   during the acute phase of infection.  Positive results are indicative  of the presence of the identified virus, but do not rule out bacterial infection or co-infection with other pathogens not detected by the test.  Clinical correlation with patient history and  other diagnostic information is necessary  to determine patient infection status.  The expected result is negative.  Fact Sheet for Patients:   BoilerBrush.com.cy   Fact Sheet for Healthcare Providers:   https://pope.com/    This test is not yet approved or cleared by the [place] FDA and  has been authorized for detection and/or diagnosis of SARS-CoV-2 by FDA under an Emergency Use Authorization (EUA).  This EUA will remain in effect (mean ing this test can be used) for the duration of  the COVID-19 declaration under Section 564(b)(1) of the Act, 21 U.S.C. section 360-bbb-3(b)(1), unless the authorization is terminated or revoked sooner.  Performed at [facility], [address] [place], [place] [address]   Culture, blood (routine x 2)     Status: None (Preliminary result)   Collection Time: 10/12/19  3:00 AM   Specimen: BLOOD  Result Value Ref Range Status   Specimen Description BLOOD LEFT [facility]  Final   Special Requests   Final    BOTTLES DRAWN AEROBIC AND ANAEROBIC Blood Culture adequate volume   Culture   Final    NO GROWTH 1 DAY Performed at [facility], [address], [place], [place] [address]    Report Status PENDING  Incomplete  Culture, blood (routine x 2)     Status: None (Preliminary result)   Collection Time: 10/12/19  5:14 AM   Specimen: BLOOD  Result Value Ref Range Status   Specimen Description BLOOD LEFT [facility]  Final   Special Requests   Final    BOTTLES DRAWN AEROBIC AND ANAEROBIC Blood Culture adequate volume   Culture   Final    NO GROWTH 1 DAY Performed at [facility], [address] [place], [place]  [address]    Report Status PENDING  Incomplete  Urine culture     Status: None   Collection Time: 10/12/19  9:10 AM   Specimen: Urine, Random  Result Value Ref Range Status   Specimen Description   Final    URINE, RANDOM Performed at [facility], [address], [place], [place] [address]    Special Requests   Final    NONE Performed at [facility], [address] [place], [place] [address]    Culture   Final    NO GROWTH Performed at [facility], [address] [place], [place] [address]    Report Status 10/13/2019 FINAL  Final    No results for input(s): LIPASE, AMYLASE in the last 168 hours. No results for input(s): AMMONIA in the last 168 hours.  Cardiac Enzymes: No results for input(s): CKTOTAL, CKMB, CKMBINDEX, TROPONINI in the last 168 hours. BNP (last 3 results) Recent Labs    10/12/19 0300  BNP 11.8    ProBNP (last 3 results) No results for input(s): PROBNP in the last 8760 hours.  Studies:  DG Chest 2 View  Result Date: 10/11/2019 CLINICAL DATA:  44 year old female with history of shortness of breath, cough and body aches. EXAM: CHEST - 2 VIEW COMPARISON:  Chest x-ray 04/22/2019. FINDINGS: Lung volumes are normal. No consolidative airspace disease. No pleural effusions. No pneumothorax. No pulmonary nodule or mass noted. Pulmonary vasculature and the cardiomediastinal silhouette are within normal limits. IMPRESSION: No radiographic evidence of acute cardiopulmonary disease. Electronically Signed   By: Trudie Reed M.D.   On: 10/11/2019 19:23   CT Angio Chest PE W/Cm &/Or Wo Cm  Result Date: 10/12/2019 CLINICAL DATA:  Chest pain, fevers, COVID-19 positive, pleurisy EXAM: CT ANGIOGRAPHY  CHEST WITH CONTRAST TECHNIQUE: Multidetector CT imaging of the chest was performed using the standard protocol during bolus administration of intravenous contrast. Multiplanar CT image reconstructions and MIPs were obtained to evaluate the vascular  anatomy. CONTRAST:  OMNIPAQUE IOHEXOL 350 MG/ML SOLN COMPARISON:  Radiograph 10/11/2019 FINDINGS: Cardiovascular: Satisfactory opacification the pulmonary arteries to the segmental level. No pulmonary artery filling defects are identified. Central pulmonary arteries are normal caliber. Normal heart size. No pericardial effusion. The aorta is normal caliber. Shared origin of the brachiocephalic and left common carotid artery. The left vertebral artery arises directly from the aortic arch between the shared origin and the left subclavian artery. Proximal great vessels are otherwise unremarkable. No major venous abnormalities. Mediastinum/Nodes: Scattered low-attenuation subcentimeter mediastinal and hilar nodes are present. Favored to be reactive. No worrisome mediastinal, hilar or axillary adenopathy. No mediastinal fluid or gas. Normal thyroid gland and thoracic inlet. No acute abnormality of the trachea or esophagus. Lungs/Pleura: Multifocal areas of ground-glass nodularity are present throughout both lungs. No pneumothorax or effusion. No convincing CT features of edema. Atelectatic changes are present dependently likely accentuated by imaging during exhalation. Upper Abdomen: Diffuse hepatic hypoattenuation compatible with hepatic steatosis. No acute abnormalities present in the visualized portions of the upper abdomen. Musculoskeletal: No acute osseous abnormality or suspicious osseous lesion. No worrisome chest wall lesions. Review of the MIP images confirms the above findings. IMPRESSION: 1. No evidence of acute pulmonary embolism. 2. Multifocal areas of ground-glass nodularity throughout both lungs, consistent with an atypical infection in the setting of COVID-19 positivity. 3. Hepatic steatosis. Electronically Signed   By: Kreg Shropshire M.D.   On: 10/12/2019 04:10       Sabrina Hanna S Alixandrea Milleson   [facility] If 7PM-7AM, please contact night-coverage at [email or web address], Office   [phone]   10/13/2019, 4:35 PM  LOS: 1 day

## 2019-10-13 NOTE — ED Notes (Signed)
Pt called out asking for something sweet to drink such as orange juice. Pt offered juice and italian ice.

## 2019-10-13 NOTE — ED Notes (Addendum)
Pt given lunch tray.

## 2019-10-14 LAB — GLUCOSE, CAPILLARY
Glucose-Capillary: 213 mg/dL — ABNORMAL HIGH (ref 70–99)
Glucose-Capillary: 384 mg/dL — ABNORMAL HIGH (ref 70–99)
Glucose-Capillary: 391 mg/dL — ABNORMAL HIGH (ref 70–99)
Glucose-Capillary: 295 mg/dL — ABNORMAL HIGH (ref 70–99)

## 2019-10-14 LAB — COMPREHENSIVE METABOLIC PANEL
ALT: 51 U/L — ABNORMAL HIGH (ref 0–44)
AST: 34 U/L (ref 15–41)
Albumin: 3.9 g/dL (ref 3.5–5.0)
Alkaline Phosphatase: 65 U/L (ref 38–126)
Anion gap: 10 (ref 5–15)
BUN: 13 mg/dL (ref 6–20)
CO2: 25 mmol/L (ref 22–32)
Calcium: 8.6 mg/dL — ABNORMAL LOW (ref 8.9–10.3)
Chloride: 102 mmol/L (ref 98–111)
Creatinine, Ser: 0.33 mg/dL — ABNORMAL LOW (ref 0.44–1.00)
GFR calc Af Amer: >60 mL/min (ref >60)
GFR calc non Af Amer: >60 mL/min (ref >60)
Glucose, Bld: 203 mg/dL — ABNORMAL HIGH (ref 70–99)
Potassium: 3.1 mmol/L — ABNORMAL LOW (ref 3.5–5.1)
Sodium: 137 mmol/L (ref 135–145)
Total Bilirubin: 0.7 mg/dL (ref 0.3–1.2)
Total Protein: 7.5 g/dL (ref 6.5–8.1)

## 2019-10-14 LAB — CBC WITH DIFFERENTIAL/PLATELET
Abs Immature Granulocytes: 0.01 10*3/uL (ref 0.00–0.07)
Basophils Absolute: 0 10*3/uL (ref 0.0–0.1)
Basophils Relative: 0 %
Eosinophils Absolute: 0 10*3/uL (ref 0.0–0.5)
Eosinophils Relative: 0 %
HCT: 38.6 % (ref 36.0–46.0)
Hemoglobin: 12.7 g/dL (ref 12.0–15.0)
Immature Granulocytes: 0 %
Lymphocytes Relative: 34 %
Lymphs Abs: 2 10*3/uL (ref 0.7–4.0)
MCH: 29.5 pg (ref 26.0–34.0)
MCHC: 32.9 g/dL (ref 30.0–36.0)
MCV: 89.8 fL (ref 80.0–100.0)
Monocytes Absolute: 0.4 10*3/uL (ref 0.1–1.0)
Monocytes Relative: 8 %
Neutro Abs: 3.3 10*3/uL (ref 1.7–7.7)
Neutrophils Relative %: 58 %
Platelets: 165 10*3/uL (ref 150–400)
RBC: 4.3 MIL/uL (ref 3.87–5.11)
RDW: 13.6 % (ref 11.5–15.5)
WBC: 5.8 10*3/uL (ref 4.0–10.5)
nRBC: 0 % (ref 0.0–0.2)

## 2019-10-14 LAB — FIBRIN DERIVATIVES D-DIMER (ARMC ONLY): Fibrin derivatives D-dimer (ARMC): 214.12 ng/mL (FEU) (ref 0.00–499.00)

## 2019-10-14 LAB — MAGNESIUM: Magnesium: 2.1 mg/dL (ref 1.7–2.4)

## 2019-10-14 LAB — FERRITIN: Ferritin: 184 ng/mL (ref 11–307)

## 2019-10-14 LAB — PHOSPHORUS: Phosphorus: 4.4 mg/dL (ref 2.5–4.6)

## 2019-10-14 LAB — HIV ANTIBODY (ROUTINE TESTING W REFLEX): HIV Screen 4th Generation wRfx: NONREACTIVE

## 2019-10-14 LAB — C-REACTIVE PROTEIN: CRP: 1.9 mg/dL — ABNORMAL HIGH (ref <1.0)

## 2019-10-14 MED ORDER — INSULIN ASPART 100 UNIT/ML ~~LOC~~ SOLN
3.0000 [IU] | Freq: Three times a day (TID) | SUBCUTANEOUS | Status: DC
  Administered 2019-10-14 – 2019-10-15 (×4): 3 [IU] via SUBCUTANEOUS
  Filled 2019-10-14 (×4): qty 1

## 2019-10-14 MED ORDER — POTASSIUM CHLORIDE 10 MEQ/100ML IV SOLN: 10.0000 meq | INTRAVENOUS | Status: DC

## 2019-10-14 MED ORDER — POTASSIUM CHLORIDE CRYS ER 20 MEQ PO TBCR
40.0000 meq | EXTENDED_RELEASE_TABLET | ORAL | Status: AC
  Administered 2019-10-14 (×2): 40 meq via ORAL
  Filled 2019-10-14 (×2): qty 2

## 2019-10-14 NOTE — Progress Notes (Signed)
[facility]  PROGRESS NOTE  Geoffery SpruceJosefina Alpert ZOX:[id number] DOB: August 24, 1975 DOA: 10/12/2019 PCP: Leotis ShamesSingh, Jasmine, MD   Brief HPI:   44 year old female with medical history of diabetes mellitus type 2, pancreatitis, hypertriglyceridemia who came to ED for evaluation of nonproductive cough, myalgia, headache, exertional shortness of breath.  Patient says that she had symptoms since 10/03/2019 and tested positive for COVID-19 on 10/07/2019.  Her husband also has been sick with COVID-19 virus.  She has been taking Mucinex and albuterol inhaler.  Patient was tachycardic with pulse 109, respiration 18/min, T-max 100.3.  Blood pressure 124/94.  CTA chest showed multifocal areas of groundglass nodularity throughout the lungs consistent with atypical infection in the setting of COVID-19 positivity.  No evidence of acute pulmonary embolism.    Subjective   Patient seen and examined, breathing has improved.  Not requiring oxygen.  O2 sats 96% on room air.   Assessment/Plan:     1. Pneumonia due to COVID-19 virus-patient started on remdesivir per pharmacy, Decadron 6 mg IV daily.  She is currently not requiring oxygen.  CRP is 1.9 this morning.  Follow CRP, ferritin in a.m.  Continue vitamin C, zinc sulfate. 2. Diabetes mellitus type 2-blood glucose is mildly elevated.  Likely from Decadron as above.  Will add NovoLog 2 units with meal coverage.  Continue sliding scale insulin with NovoLog,  15 units subcu twice daily.  CBG well controlled. 3. Hypokalemia-potassium is 3.1, replace potassium with K. Dur 40 mEq p.o. every 4 hours x2.  Follow BMP in am. 4. Hyperlipidemia-continue atorvastatin. 5. GERD-continue pantoprazole.    COVID-19 Labs  Recent Labs    10/12/19 0300 10/13/19 0645 10/14/19 0513  FERRITIN 196 204 184  LDH 177  --   --   CRP  --   --  1.9*    Lab Results  Component Value Date   SARSCOV2NAA POSITIVE (A) 10/11/2019     Scheduled medications:   . vitamin C  500 mg  Oral Daily  . atorvastatin  20 mg Oral QHS  . dexamethasone (DECADRON) injection  6 mg Intravenous Q24H  . enoxaparin (LOVENOX) injection  40 mg Subcutaneous Q24H  . fenofibrate  54 mg Oral Daily  . gabapentin  100 mg Oral TID  . insulin aspart  0-15 Units Subcutaneous TID WC  . insulin aspart  3 Units Subcutaneous TID WC  . insulin detemir  15 Units Subcutaneous BID  . pantoprazole  40 mg Oral Daily  . potassium chloride  40 mEq Oral Q4H  . sodium chloride flush  3 mL Intravenous Q12H  . zinc sulfate  220 mg Oral Daily         CBG: Recent Labs  Lab 10/12/19 2159 10/13/19 0826 10/13/19 1325 10/13/19 1722 10/14/19 0803  GLUCAP 284* 169* 291* 467* 213*    SpO2: 96 %    CBC: Recent Labs  Lab 10/11/19 1837 10/13/19 0645 10/14/19 0513  WBC 7.4 7.1 5.8  NEUTROABS  --  4.7 3.3  HGB 13.1 12.4 12.7  HCT 39.7 37.5 38.6  MCV 90.2 90.4 89.8  PLT 174 159 165    Basic Metabolic Panel: Recent Labs  Lab 10/11/19 1837 10/13/19 0645 10/14/19 0513  NA 135 137 137  K 3.3* 3.6 3.1*  CL 103 103 102  CO2 22 23 25   GLUCOSE 183* 207* 203*  BUN 8 10 13   CREATININE 0.35* 0.53 0.33*  CALCIUM 8.7* 8.7* 8.6*  MG  --  1.9 2.1  PHOS  --  3.1 4.4     Liver Function Tests: Recent Labs  Lab 10/13/19 0645 10/14/19 0513  AST 41 34  ALT 56* 51*  ALKPHOS 65 65  BILITOT 0.6 0.7  PROT 7.4 7.5  ALBUMIN 3.9 3.9     Antibiotics: Anti-infectives (From admission, onward)   Start     Dose/Rate Route Frequency Ordered Stop   10/13/19 1000  remdesivir 100 mg in sodium chloride 0.9 % 100 mL IVPB       "Followed by" Linked Group Details   100 mg 200 mL/hr over 30 Minutes Intravenous Daily 10/12/19 0511 10/17/19 0959   10/13/19 1000  remdesivir 100 mg in sodium chloride 0.9 % 100 mL IVPB  Status:  Discontinued       "Followed by" Linked Group Details   100 mg 200 mL/hr over 30 Minutes Intravenous Daily 10/12/19 0844 10/12/19 0858   10/12/19 0845  remdesivir 200 mg in sodium  chloride 0.9% 250 mL IVPB  Status:  Discontinued       "Followed by" Linked Group Details   200 mg 580 mL/hr over 30 Minutes Intravenous Once 10/12/19 0844 10/12/19 0858   10/12/19 0630  remdesivir 200 mg in sodium chloride 0.9% 250 mL IVPB       "Followed by" Linked Group Details   200 mg 580 mL/hr over 30 Minutes Intravenous Once 10/12/19 0511 10/12/19 0813       DVT prophylaxis: Lovenox  Code Status: Full code  Family Communication: No family at bedside    Status is: Inpatient  Dispo: The patient is from: Home              Anticipated d/c is to: Home              Anticipated d/c date is: 10/17/2019              Patient currently not medically stable for discharge  Barrier to discharge-COVID-19 pneumonia          Consultants:    Procedures:     Objective   Vitals:   10/13/19 1610 10/13/19 1723 10/13/19 2058 10/14/19 0803  BP: (!) 112/59 113/74 107/76 104/66  Pulse: (!) 109 99 82 65  Resp: 20 16 16 16   Temp:  99 F (37.2 C) 98.2 F (36.8 C) 98.2 F (36.8 C)  TempSrc:      SpO2: 97% 98% 97% 96%  Weight:      Height:        Intake/Output Summary (Last 24 hours) at 10/14/2019 1234 Last data filed at 10/13/2019 2120 Gross per 24 hour  Intake 3 ml  Output --  Net 3 ml    08/18 1901 - 08/20 0700 In: 103 [I.V.:3] Out: -   Filed Weights   10/11/19 1811 10/11/19 2341  Weight: 63.5 kg 63.5 kg    Physical Examination:   General-appears in no acute distress  Heart-S1-S2, regular, no murmur auscultated  Lungs-clear to auscultation bilaterally, no wheezing or crackles auscultated  Abdomen-soft, nontender, no organomegaly  Extremities-no edema in the lower extremities  Neuro-alert, oriented x3, no focal deficit noted    Data Reviewed:   Recent Results (from the past 240 hour(s))  SARS Coronavirus 2 by RT PCR (hospital order, performed in [facility] hospital lab) Nasopharyngeal Nasopharyngeal Swab     Status: Abnormal   Collection  Time: 10/11/19 11:39 PM   Specimen: Nasopharyngeal Swab  Result Value Ref Range Status   SARS Coronavirus 2 POSITIVE (A) NEGATIVE Final  Comment: CRITICAL RESULT CALLED TO, READ BACK BY AND VERIFIED WITH: LISA THOMPSON @ 0102 ON 10/12/2019 [facility] (NOTE) SARS-CoV-2 target nucleic acids are DETECTED  SARS-CoV-2 RNA is generally detectable in upper respiratory specimens  during the acute phase of infection.  Positive results are indicative  of the presence of the identified virus, but do not rule out bacterial infection or co-infection with other pathogens not detected by the test.  Clinical correlation with patient history and  other diagnostic information is necessary to determine patient infection status.  The expected result is negative.  Fact Sheet for Patients:   BoilerBrush.com.cy   Fact Sheet for Healthcare Providers:   https://pope.com/    This test is not yet approved or cleared by the [place] FDA and  has been authorized for detection and/or diagnosis of SARS-CoV-2 by FDA under an Emergency Use Authorization (EUA).  This EUA will remain in effect (mean ing this test can be used) for the duration of  the COVID-19 declaration under Section 564(b)(1) of the Act, 21 U.S.C. section 360-bbb-3(b)(1), unless the authorization is terminated or revoked sooner.  Performed at [facility], [address] [place], [place] [address]   Culture, blood (routine x 2)     Status: None (Preliminary result)   Collection Time: 10/12/19  3:00 AM   Specimen: BLOOD  Result Value Ref Range Status   Specimen Description BLOOD LEFT [facility]  Final   Special Requests   Final    BOTTLES DRAWN AEROBIC AND ANAEROBIC Blood Culture adequate volume   Culture   Final    NO GROWTH 2 DAYS Performed at [facility], [address], [place], [place] [address]    Report Status PENDING  Incomplete  Culture, blood (routine x 2)     Status:  None (Preliminary result)   Collection Time: 10/12/19  5:14 AM   Specimen: BLOOD  Result Value Ref Range Status   Specimen Description BLOOD LEFT [facility]  Final   Special Requests   Final    BOTTLES DRAWN AEROBIC AND ANAEROBIC Blood Culture adequate volume   Culture   Final    NO GROWTH 2 DAYS Performed at [facility], [address] [place], [place] [address]    Report Status PENDING  Incomplete  Urine culture     Status: None   Collection Time: 10/12/19  9:10 AM   Specimen: Urine, Random  Result Value Ref Range Status   Specimen Description   Final    URINE, RANDOM Performed at [facility], [address] [place], [place] [address]    Special Requests   Final    NONE Performed at [facility], [address] [place], [place] [address]    Culture   Final    NO GROWTH Performed at [facility], [address], [place], [place] [address]    Report Status 10/13/2019 FINAL  Final    No results for input(s): LIPASE, AMYLASE in the last 168 hours. No results for input(s): AMMONIA in the last 168 hours.  Cardiac Enzymes: No results for input(s): CKTOTAL, CKMB, CKMBINDEX, TROPONINI in the last 168 hours. BNP (last 3 results) Recent Labs    10/12/19 0300  BNP 11.8    ProBNP (last 3 results) No results for input(s): PROBNP in the last 8760 hours.  Studies:  No results found.     Meredeth Ide   [facility] If 7PM-7AM, please contact night-coverage at [email or web address], Office  [phone]  10/14/2019, 12:34 PM  LOS: 2 days

## 2019-10-15 LAB — CBC WITH DIFFERENTIAL/PLATELET
Abs Immature Granulocytes: 0.03 10*3/uL (ref 0.00–0.07)
Basophils Absolute: 0 10*3/uL (ref 0.0–0.1)
Basophils Relative: 0 %
Eosinophils Absolute: 0 10*3/uL (ref 0.0–0.5)
Eosinophils Relative: 1 %
HCT: 38.1 % (ref 36.0–46.0)
Hemoglobin: 12.4 g/dL (ref 12.0–15.0)
Immature Granulocytes: 0 %
Lymphocytes Relative: 34 %
Lymphs Abs: 2.9 10*3/uL (ref 0.7–4.0)
MCH: 29.7 pg (ref 26.0–34.0)
MCHC: 32.5 g/dL (ref 30.0–36.0)
MCV: 91.4 fL (ref 80.0–100.0)
Monocytes Absolute: 0.5 10*3/uL (ref 0.1–1.0)
Monocytes Relative: 6 %
Neutro Abs: 5.1 10*3/uL (ref 1.7–7.7)
Neutrophils Relative %: 59 %
Platelets: 174 10*3/uL (ref 150–400)
RBC: 4.17 MIL/uL (ref 3.87–5.11)
RDW: 13.7 % (ref 11.5–15.5)
WBC: 8.6 10*3/uL (ref 4.0–10.5)
nRBC: 0 % (ref 0.0–0.2)

## 2019-10-15 LAB — COMPREHENSIVE METABOLIC PANEL
ALT: 40 U/L (ref 0–44)
AST: 27 U/L (ref 15–41)
Albumin: 3.7 g/dL (ref 3.5–5.0)
Alkaline Phosphatase: 64 U/L (ref 38–126)
Anion gap: 12 (ref 5–15)
BUN: 12 mg/dL (ref 6–20)
CO2: 23 mmol/L (ref 22–32)
Calcium: 8.6 mg/dL — ABNORMAL LOW (ref 8.9–10.3)
Chloride: 102 mmol/L (ref 98–111)
Creatinine, Ser: 0.49 mg/dL (ref 0.44–1.00)
GFR calc Af Amer: >60 mL/min (ref >60)
GFR calc non Af Amer: >60 mL/min (ref >60)
Glucose, Bld: 287 mg/dL — ABNORMAL HIGH (ref 70–99)
Potassium: 3.9 mmol/L (ref 3.5–5.1)
Sodium: 137 mmol/L (ref 135–145)
Total Bilirubin: 0.6 mg/dL (ref 0.3–1.2)
Total Protein: 7.2 g/dL (ref 6.5–8.1)

## 2019-10-15 LAB — FIBRIN DERIVATIVES D-DIMER (ARMC ONLY): Fibrin derivatives D-dimer (ARMC): 181.07 ng/mL (FEU) (ref 0.00–499.00)

## 2019-10-15 LAB — MAGNESIUM: Magnesium: 1.9 mg/dL (ref 1.7–2.4)

## 2019-10-15 LAB — C-REACTIVE PROTEIN: CRP: 2.1 mg/dL — ABNORMAL HIGH (ref <1.0)

## 2019-10-15 LAB — PHOSPHORUS: Phosphorus: 3.4 mg/dL (ref 2.5–4.6)

## 2019-10-15 LAB — FERRITIN: Ferritin: 177 ng/mL (ref 11–307)

## 2019-10-15 LAB — GLUCOSE, CAPILLARY
Glucose-Capillary: 241 mg/dL — ABNORMAL HIGH (ref 70–99)
Glucose-Capillary: 300 mg/dL — ABNORMAL HIGH (ref 70–99)
Glucose-Capillary: 311 mg/dL — ABNORMAL HIGH (ref 70–99)
Glucose-Capillary: 292 mg/dL — ABNORMAL HIGH (ref 70–99)

## 2019-10-15 MED ORDER — INSULIN ASPART 100 UNIT/ML ~~LOC~~ SOLN
4.0000 [IU] | Freq: Three times a day (TID) | SUBCUTANEOUS | Status: DC
  Administered 2019-10-15 – 2019-10-16 (×3): 4 [IU] via SUBCUTANEOUS
  Filled 2019-10-15 (×3): qty 1

## 2019-10-15 NOTE — Progress Notes (Signed)
[facility]  PROGRESS NOTE  Sabrina Hanna JXB:[id number] DOB: 13-Apr-1975 DOA: 10/12/2019 PCP: Leotis Shames, MD   Brief HPI:   44 year old female with medical history of diabetes mellitus type 2, pancreatitis, hypertriglyceridemia who came to ED for evaluation of nonproductive cough, myalgia, headache, exertional shortness of breath.  Patient says that she had symptoms since 10/03/2019 and tested positive for COVID-19 on 10/07/2019.  Her husband also has been sick with COVID-19 virus.  She has been taking Mucinex and albuterol inhaler.  Patient was tachycardic with pulse 109, respiration 18/min, T-max 100.3.  Blood pressure 124/94.  CTA chest showed multifocal areas of groundglass nodularity throughout the lungs consistent with atypical infection in the setting of COVID-19 positivity.  No evidence of acute pulmonary embolism.    Subjective   Patient seen and examined, still coughing up phlegm.  Denies shortness of breath.  Currently not requiring oxygen.   Assessment/Plan:     1. Pneumonia due to COVID-19 virus-patient started on remdesivir per pharmacy, Decadron 6 mg IV daily.  She is currently not requiring oxygen.  CRP is 1.9   Follow CRP, ferritin in a.m.  Continue vitamin C, zinc sulfate. 2. Diabetes mellitus type 2-blood glucose is mildly elevated.  Likely from Decadron as above.  Blood glucose is still elevated.  Will increase meal coverage to 4 units 3 times daily .  Continue sliding scale insulin with NovoLog,  15 units subcu twice daily.   3. Hypokalemia-replete 4. Hyperlipidemia-continue atorvastatin. 5. GERD-continue pantoprazole.    COVID-19 Labs  Recent Labs    10/13/19 0645 10/14/19 0513 10/15/19 0615  FERRITIN 204 184 177  CRP  --  1.9*  --     Lab Results  Component Value Date   SARSCOV2NAA POSITIVE (A) 10/11/2019     Scheduled medications:   . vitamin C  500 mg Oral Daily  . atorvastatin  20 mg Oral QHS  . dexamethasone (DECADRON) injection   6 mg Intravenous Q24H  . enoxaparin (LOVENOX) injection  40 mg Subcutaneous Q24H  . fenofibrate  54 mg Oral Daily  . gabapentin  100 mg Oral TID  . insulin aspart  0-15 Units Subcutaneous TID WC  . insulin aspart  3 Units Subcutaneous TID WC  . insulin detemir  15 Units Subcutaneous BID  . pantoprazole  40 mg Oral Daily  . sodium chloride flush  3 mL Intravenous Q12H  . zinc sulfate  220 mg Oral Daily         CBG: Recent Labs  Lab 10/14/19 1237 10/14/19 1611 10/14/19 2119 10/15/19 0807 10/15/19 1114  GLUCAP 384* 391* 295* 241* 300*    SpO2: 98 %    CBC: Recent Labs  Lab 10/11/19 1837 10/13/19 0645 10/14/19 0513 10/15/19 0615  WBC 7.4 7.1 5.8 8.6  NEUTROABS  --  4.7 3.3 5.1  HGB 13.1 12.4 12.7 12.4  HCT 39.7 37.5 38.6 38.1  MCV 90.2 90.4 89.8 91.4  PLT 174 159 165 174    Basic Metabolic Panel: Recent Labs  Lab 10/11/19 1837 10/13/19 0645 10/14/19 0513 10/15/19 0615  NA 135 137 137 137  K 3.3* 3.6 3.1* 3.9  CL 103 103 102 102  CO2 22 23 25 23   GLUCOSE 183* 207* 203* 287*  BUN 8 10 13 12   CREATININE 0.35* 0.53 0.33* 0.49  CALCIUM 8.7* 8.7* 8.6* 8.6*  MG  --  1.9 2.1 1.9  PHOS  --  3.1 4.4 3.4     Liver Function Tests: Recent Labs  Lab 10/13/19 0645 10/14/19 0513 10/15/19 0615  AST 41 34 27  ALT 56* 51* 40  ALKPHOS 65 65 64  BILITOT 0.6 0.7 0.6  PROT 7.4 7.5 7.2  ALBUMIN 3.9 3.9 3.7     Antibiotics: Anti-infectives (From admission, onward)   Start     Dose/Rate Route Frequency Ordered Stop   10/13/19 1000  remdesivir 100 mg in sodium chloride 0.9 % 100 mL IVPB       "Followed by" Linked Group Details   100 mg 200 mL/hr over 30 Minutes Intravenous Daily 10/12/19 0511 10/17/19 0959   10/13/19 1000  remdesivir 100 mg in sodium chloride 0.9 % 100 mL IVPB  Status:  Discontinued       "Followed by" Linked Group Details   100 mg 200 mL/hr over 30 Minutes Intravenous Daily 10/12/19 0844 10/12/19 0858   10/12/19 0845  remdesivir 200 mg in  sodium chloride 0.9% 250 mL IVPB  Status:  Discontinued       "Followed by" Linked Group Details   200 mg 580 mL/hr over 30 Minutes Intravenous Once 10/12/19 0844 10/12/19 0858   10/12/19 0630  remdesivir 200 mg in sodium chloride 0.9% 250 mL IVPB       "Followed by" Linked Group Details   200 mg 580 mL/hr over 30 Minutes Intravenous Once 10/12/19 0511 10/12/19 0813       DVT prophylaxis: Lovenox  Code Status: Full code  Family Communication: No family at bedside    Status is: Inpatient  Dispo: The patient is from: Home              Anticipated d/c is to: Home              Anticipated d/c date is: 10/16/2019              Patient currently not medically stable for discharge  Barrier to discharge-COVID-19 pneumonia treatment       Consultants:    Procedures:     Objective   Vitals:   10/15/19 0058 10/15/19 0517 10/15/19 0806 10/15/19 1115  BP: 107/68 106/67 108/72 120/72  Pulse: 73 (!) 57 90 62  Resp: 16 17 16 18   Temp: 98.4 F (36.9 C) 98.3 F (36.8 C) 98.4 F (36.9 C) (!) 97.4 F (36.3 C)  TempSrc: Oral Oral Oral Oral  SpO2: 97% 95% 99% 98%  Weight:      Height:        Intake/Output Summary (Last 24 hours) at 10/15/2019 1220 Last data filed at 10/15/2019 10/17/2019 Gross per 24 hour  Intake 3 ml  Output --  Net 3 ml    08/19 1901 - 08/21 0700 In: 3 [I.V.:3] Out: -   Filed Weights   10/11/19 1811 10/11/19 2341  Weight: 63.5 kg 63.5 kg    Physical Examination:  General-appears in no acute distress Heart-S1-S2, regular, no murmur auscultated Lungs-clear to auscultation bilaterally, no wheezing or crackles auscultated Abdomen-soft, nontender, no organomegaly Extremities-no edema in the lower extremities Neuro-alert, oriented x3, no focal deficit noted   Data Reviewed:   Recent Results (from the past 240 hour(s))  SARS Coronavirus 2 by RT PCR (hospital order, performed in [facility] hospital lab) Nasopharyngeal Nasopharyngeal Swab      Status: Abnormal   Collection Time: 10/11/19 11:39 PM   Specimen: Nasopharyngeal Swab  Result Value Ref Range Status   SARS Coronavirus 2 POSITIVE (A) NEGATIVE Final    Comment: CRITICAL RESULT CALLED TO, READ BACK BY AND  VERIFIED WITH: LISA THOMPSON @ 0102 ON 10/12/2019 RH (NOTE) SARS-CoV-2 target nucleic acids are DETECTED  SARS-CoV-2 RNA is generally detectable in upper respiratory specimens  during the acute phase of infection.  Positive results are indicative  of the presence of the identified virus, but do not rule out bacterial infection or co-infection with other pathogens not detected by the test.  Clinical correlation with patient history and  other diagnostic information is necessary to determine patient infection status.  The expected result is negative.  Fact Sheet for Patients:   BoilerBrush.com.cy   Fact Sheet for Healthcare Providers:   https://pope.com/    This test is not yet approved or cleared by the [place] FDA and  has been authorized for detection and/or diagnosis of SARS-CoV-2 by FDA under an Emergency Use Authorization (EUA).  This EUA will remain in effect (mean ing this test can be used) for the duration of  the COVID-19 declaration under Section 564(b)(1) of the Act, 21 U.S.C. section 360-bbb-3(b)(1), unless the authorization is terminated or revoked sooner.  Performed at [facility], [address] [place], [place] [address]   Culture, blood (routine x 2)     Status: None (Preliminary result)   Collection Time: 10/12/19  3:00 AM   Specimen: BLOOD  Result Value Ref Range Status   Specimen Description BLOOD LEFT [facility]  Final   Special Requests   Final    BOTTLES DRAWN AEROBIC AND ANAEROBIC Blood Culture adequate volume   Culture   Final    NO GROWTH 3 DAYS Performed at [facility], [address], [place], [place] [address]    Report Status PENDING  Incomplete  Culture,  blood (routine x 2)     Status: None (Preliminary result)   Collection Time: 10/12/19  5:14 AM   Specimen: BLOOD  Result Value Ref Range Status   Specimen Description BLOOD LEFT [facility]  Final   Special Requests   Final    BOTTLES DRAWN AEROBIC AND ANAEROBIC Blood Culture adequate volume   Culture   Final    NO GROWTH 3 DAYS Performed at [facility], [address], [place], [place] [address]    Report Status PENDING  Incomplete  Urine culture     Status: None   Collection Time: 10/12/19  9:10 AM   Specimen: Urine, Random  Result Value Ref Range Status   Specimen Description   Final    URINE, RANDOM Performed at [facility], [address], [place], [place] [address]    Special Requests   Final    NONE Performed at [facility], [address], [place], [place] [address]    Culture   Final    NO GROWTH Performed at [facility], [address] [place], [place] [address]    Report Status 10/13/2019 FINAL  Final    No results for input(s): LIPASE, AMYLASE in the last 168 hours. No results for input(s): AMMONIA in the last 168 hours.  Cardiac Enzymes: No results for input(s): CKTOTAL, CKMB, CKMBINDEX, TROPONINI in the last 168 hours. BNP (last 3 results) Recent Labs    10/12/19 0300  BNP 11.8        Keigan Girten S Torion Hulgan   [facility] If 7PM-7AM, please contact night-coverage at [email or web address], Office  [phone]   10/15/2019, 12:20 PM  LOS: 3 days

## 2019-10-16 LAB — CBC WITH DIFFERENTIAL/PLATELET
Abs Immature Granulocytes: 0.05 10*3/uL (ref 0.00–0.07)
Basophils Absolute: 0 10*3/uL (ref 0.0–0.1)
Basophils Relative: 0 %
Eosinophils Absolute: 0 10*3/uL (ref 0.0–0.5)
Eosinophils Relative: 0 %
HCT: 38 % (ref 36.0–46.0)
Hemoglobin: 12.6 g/dL (ref 12.0–15.0)
Immature Granulocytes: 1 %
Lymphocytes Relative: 27 %
Lymphs Abs: 2.5 10*3/uL (ref 0.7–4.0)
MCH: 29.9 pg (ref 26.0–34.0)
MCHC: 33.2 g/dL (ref 30.0–36.0)
MCV: 90 fL (ref 80.0–100.0)
Monocytes Absolute: 0.4 10*3/uL (ref 0.1–1.0)
Monocytes Relative: 5 %
Neutro Abs: 6.2 10*3/uL (ref 1.7–7.7)
Neutrophils Relative %: 67 %
Platelets: 218 10*3/uL (ref 150–400)
RBC: 4.22 MIL/uL (ref 3.87–5.11)
RDW: 13.3 % (ref 11.5–15.5)
WBC: 9.3 10*3/uL (ref 4.0–10.5)
nRBC: 0 % (ref 0.0–0.2)

## 2019-10-16 LAB — COMPREHENSIVE METABOLIC PANEL WITH GFR
ALT: 33 U/L (ref 0–44)
AST: 22 U/L (ref 15–41)
Albumin: 3.6 g/dL (ref 3.5–5.0)
Alkaline Phosphatase: 63 U/L (ref 38–126)
Anion gap: 12 (ref 5–15)
BUN: 13 mg/dL (ref 6–20)
CO2: 26 mmol/L (ref 22–32)
Calcium: 9.1 mg/dL (ref 8.9–10.3)
Chloride: 98 mmol/L (ref 98–111)
Creatinine, Ser: 0.49 mg/dL (ref 0.44–1.00)
GFR calc Af Amer: >60 mL/min
GFR calc non Af Amer: >60 mL/min
Glucose, Bld: 251 mg/dL — ABNORMAL HIGH (ref 70–99)
Potassium: 3.7 mmol/L (ref 3.5–5.1)
Sodium: 136 mmol/L (ref 135–145)
Total Bilirubin: 0.6 mg/dL (ref 0.3–1.2)
Total Protein: 7.3 g/dL (ref 6.5–8.1)

## 2019-10-16 LAB — FIBRIN DERIVATIVES D-DIMER (ARMC ONLY): Fibrin derivatives D-dimer (ARMC): 170.39 ng/mL (FEU) (ref 0.00–499.00)

## 2019-10-16 LAB — GLUCOSE, CAPILLARY
Glucose-Capillary: 245 mg/dL — ABNORMAL HIGH (ref 70–99)
Glucose-Capillary: 376 mg/dL — ABNORMAL HIGH (ref 70–99)

## 2019-10-16 LAB — FERRITIN: Ferritin: 183 ng/mL (ref 11–307)

## 2019-10-16 LAB — PHOSPHORUS: Phosphorus: 3.7 mg/dL (ref 2.5–4.6)

## 2019-10-16 LAB — C-REACTIVE PROTEIN: CRP: 1.7 mg/dL — ABNORMAL HIGH

## 2019-10-16 LAB — MAGNESIUM: Magnesium: 1.7 mg/dL (ref 1.7–2.4)

## 2019-10-16 MED ORDER — NOVOLOG FLEXPEN 100 UNIT/ML ~~LOC~~ SOPN: 5.0000 [IU] | PEN_INJECTOR | Freq: Three times a day (TID) | SUBCUTANEOUS | 5 refills | Status: AC

## 2019-10-16 MED ORDER — DEXAMETHASONE 6 MG PO TABS: 6.0000 mg | ORAL_TABLET | Freq: Every day | ORAL | 0 refills | Status: AC

## 2019-10-16 MED ORDER — LANTUS 100 UNIT/ML ~~LOC~~ SOLN: 16.0000 [IU] | Freq: Every day | SUBCUTANEOUS | 3 refills | Status: DC

## 2019-10-16 NOTE — Progress Notes (Signed)
Pt is being discharged home.  Discharge papers given and explained to pt.  Pt verbalized understanding.  Meds and follow up appointments reviewed.  Awaiting transportation.

## 2019-10-16 NOTE — Discharge Summary (Signed)
Physician Discharge Summary  Sabrina Hanna ZOX:[id number] DOB: Apr 01, 1975 DOA: 10/12/2019  PCP: Leotis ShamesSingh, Jasmine, MD  Admit date: 10/12/2019 Discharge date: 10/16/2019  Time spent:50* minutes  Recommendations for Outpatient Follow-up:  1. Follow-up PCP in 3 weeks 2. Home isolation for 3 weeks starting from 10/12/2019 2 11/02/2019.  COVID-19 precautions   Discharge Diagnoses:  Principal Problem:   Pneumonia due to COVID-19 virus Active Problems:   Diabetes mellitus without complication [facility](HCC)   Discharge Condition: Stable  Diet recommendation: Heart healthy/carb modified diet  Filed Weights   10/11/19 1811 10/11/19 2341  Weight: 63.5 kg 63.5 kg    History of present illness:  44 year old female with medical history of diabetes mellitus type 2, pancreatitis, hypertriglyceridemia who came to ED for evaluation of nonproductive cough, myalgia, headache, exertional shortness of breath.  Patient says that she had symptoms since 10/03/2019 and tested positive for COVID-19 on 10/07/2019.  Her husband also has been sick with COVID-19 virus.  She has been taking Mucinex and albuterol inhaler.  Patient was tachycardic with pulse 109, respiration 18/min, T-max 100.3.  Blood pressure 124/94.  CTA chest showed multifocal areas of groundglass nodularity throughout the lungs consistent with atypical infection in the setting of COVID-19 positivity.  No evidence of acute pulmonary embolism.   Hospital Course:  1. Pneumonia due to COVID-19 virus-patient started on remdesivir per pharmacy, Decadron 6 mg IV daily.  She is currently not requiring oxygen.  CRP is 1.9 .  Patient has completed 5 days of remdesivir.  We will discharge her on Decadron 6 mg daily for 5 more days. 2. Diabetes mellitus type 2-blood glucose is mildly elevated.  Likely from Decadron as above.  Blood glucose is still elevated.  Patient will be discharged on home regimen of NovoLog sliding scale along with Lantus.  She will be given  prescriptions at discharge.  3. Hypokalemia-replete 4. Hyperlipidemia-continue atorvastatin. 5. GERD-continue pantoprazole.  Procedures:    Consultations:    Discharge Exam: Vitals:   10/16/19 0814 10/16/19 1154  BP: 109/64 104/69  Pulse: (!) 54 (!) 57  Resp: 18 16  Temp: 98.2 F (36.8 C) 98.1 F (36.7 C)  SpO2: 91% 95%    General: Appears in no acute distress Cardiovascular: S1-S2, regular, no murmur auscultated Respiratory: Clear to auscultation bilaterally, no wheezing or crackles auscultated  Discharge Instructions   Discharge Instructions    Diet - low sodium heart healthy   Complete by: As directed    Discharge instructions   Complete by: As directed    Isolation at home for 3 weeks starting from 10/12/19 till 11/02/19   Increase activity slowly   Complete by: As directed      Allergies as of 10/16/2019      Reactions   Latex Rash   Penicillin G Rash   Penicillins Rash, Other (See Comments)   Has patient had a PCN reaction causing immediate rash, facial/tongue/throat swelling, SOB or lightheadedness with hypotension: No Has patient had a PCN reaction causing severe rash involving mucus membranes or skin necrosis: No Has patient had a PCN reaction that required hospitalization No Has patient had a PCN reaction occurring within the last 10 years: No If all of the above answers are "NO", then may proceed with Cephalosporin use.      Medication List    STOP taking these medications   moxifloxacin 400 MG tablet Commonly known as: AVELOX   rosuvastatin 40 MG tablet Commonly known as: CRESTOR     TAKE these medications  albuterol 108 (90 Base) MCG/ACT inhaler Commonly known as: VENTOLIN HFA Inhale 2 puffs into the lungs 4 (four) times daily as needed for wheezing or shortness of breath.   atorvastatin 20 MG tablet Commonly known as: LIPITOR Take 20 mg by mouth at bedtime.   benzonatate 200 MG capsule Commonly known as: TESSALON Take 200 mg by  mouth 3 (three) times daily as needed.   dexamethasone 6 MG tablet Commonly known as: DECADRON Take 1 tablet (6 mg total) by mouth daily for 5 days.   esomeprazole 20 MG capsule Commonly known as: NEXIUM Take 20 mg by mouth daily.   famotidine 20 MG tablet Commonly known as: PEPCID Take 20 mg by mouth 2 (two) times daily.   fenofibrate 145 MG tablet Commonly known as: TRICOR Take 145 mg by mouth daily.   gabapentin 100 MG capsule Commonly known as: NEURONTIN Take 100 mg by mouth 3 (three) times daily.   Lantus 100 UNIT/ML injection Generic drug: insulin glargine Inject 0.16 mLs (16 Units total) into the skin at bedtime.   metFORMIN 500 MG tablet Commonly known as: GLUCOPHAGE Take 1,000 mg by mouth 2 (two) times daily.   NovoLOG FlexPen 100 UNIT/ML FlexPen Generic drug: insulin aspart Inject 5-15 Units into the skin 3 (three) times daily with meals. Uses sliding scale      Allergies  Allergen Reactions  . Latex Rash  . Penicillin G Rash  . Penicillins Rash and Other (See Comments)    Has patient had a PCN reaction causing immediate rash, facial/tongue/throat swelling, SOB or lightheadedness with hypotension: No Has patient had a PCN reaction causing severe rash involving mucus membranes or skin necrosis: No Has patient had a PCN reaction that required hospitalization No Has patient had a PCN reaction occurring within the last 10 years: No If all of the above answers are "NO", then may proceed with Cephalosporin use.     Follow-up Information    Leotis Shames, MD Follow up in 3 week(s).   Specialty: Internal Medicine Contact information: [address] [place] [facility] [place] [address] [phone]                The results of significant diagnostics from this hospitalization (including imaging, microbiology, ancillary and laboratory) are listed below for reference.    Significant Diagnostic Studies: DG Chest 2 View  Result Date:  10/11/2019 CLINICAL DATA:  44 year old female with history of shortness of breath, cough and body aches. EXAM: CHEST - 2 VIEW COMPARISON:  Chest x-ray 04/22/2019. FINDINGS: Lung volumes are normal. No consolidative airspace disease. No pleural effusions. No pneumothorax. No pulmonary nodule or mass noted. Pulmonary vasculature and the cardiomediastinal silhouette are within normal limits. IMPRESSION: No radiographic evidence of acute cardiopulmonary disease. Electronically Signed   By: Trudie Reed M.D.   On: 10/11/2019 19:23   CT Angio Chest PE W/Cm &/Or Wo Cm  Result Date: 10/12/2019 CLINICAL DATA:  Chest pain, fevers, COVID-19 positive, pleurisy EXAM: CT ANGIOGRAPHY CHEST WITH CONTRAST TECHNIQUE: Multidetector CT imaging of the chest was performed using the standard protocol during bolus administration of intravenous contrast. Multiplanar CT image reconstructions and MIPs were obtained to evaluate the vascular anatomy. CONTRAST:  OMNIPAQUE IOHEXOL 350 MG/ML SOLN COMPARISON:  Radiograph 10/11/2019 FINDINGS: Cardiovascular: Satisfactory opacification the pulmonary arteries to the segmental level. No pulmonary artery filling defects are identified. Central pulmonary arteries are normal caliber. Normal heart size. No pericardial effusion. The aorta is normal caliber. Shared origin of the brachiocephalic and  left common carotid artery. The left vertebral artery arises directly from the aortic arch between the shared origin and the left subclavian artery. Proximal great vessels are otherwise unremarkable. No major venous abnormalities. Mediastinum/Nodes: Scattered low-attenuation subcentimeter mediastinal and hilar nodes are present. Favored to be reactive. No worrisome mediastinal, hilar or axillary adenopathy. No mediastinal fluid or gas. Normal thyroid gland and thoracic inlet. No acute abnormality of the trachea or esophagus. Lungs/Pleura: Multifocal areas of ground-glass nodularity are present  throughout both lungs. No pneumothorax or effusion. No convincing CT features of edema. Atelectatic changes are present dependently likely accentuated by imaging during exhalation. Upper Abdomen: Diffuse hepatic hypoattenuation compatible with hepatic steatosis. No acute abnormalities present in the visualized portions of the upper abdomen. Musculoskeletal: No acute osseous abnormality or suspicious osseous lesion. No worrisome chest wall lesions. Review of the MIP images confirms the above findings. IMPRESSION: 1. No evidence of acute pulmonary embolism. 2. Multifocal areas of ground-glass nodularity throughout both lungs, consistent with an atypical infection in the setting of COVID-19 positivity. 3. Hepatic steatosis. Electronically Signed   By: Kreg Shropshire M.D.   On: 10/12/2019 04:10    Microbiology: Recent Results (from the past 240 hour(s))  SARS Coronavirus 2 by RT PCR (hospital order, performed in [facility] hospital lab) Nasopharyngeal Nasopharyngeal Swab     Status: Abnormal   Collection Time: 10/11/19 11:39 PM   Specimen: Nasopharyngeal Swab  Result Value Ref Range Status   SARS Coronavirus 2 POSITIVE (A) NEGATIVE Final    Comment: CRITICAL RESULT CALLED TO, READ BACK BY AND VERIFIED WITH: LISA THOMPSON @ 0102 ON 10/12/2019 [facility] (NOTE) SARS-CoV-2 target nucleic acids are DETECTED  SARS-CoV-2 RNA is generally detectable in upper respiratory specimens  during the acute phase of infection.  Positive results are indicative  of the presence of the identified virus, but do not rule out bacterial infection or co-infection with other pathogens not detected by the test.  Clinical correlation with patient history and  other diagnostic information is necessary to determine patient infection status.  The expected result is negative.  Fact Sheet for Patients:   BoilerBrush.com.cy   Fact Sheet for Healthcare Providers:   https://pope.com/     This test is not yet approved or cleared by the [place] FDA and  has been authorized for detection and/or diagnosis of SARS-CoV-2 by FDA under an Emergency Use Authorization (EUA).  This EUA will remain in effect (mean ing this test can be used) for the duration of  the COVID-19 declaration under Section 564(b)(1) of the Act, 21 U.S.C. section 360-bbb-3(b)(1), unless the authorization is terminated or revoked sooner.  Performed at [facility], [address] [place], [place] [address]   Culture, blood (routine x 2)     Status: None (Preliminary result)   Collection Time: 10/12/19  3:00 AM   Specimen: BLOOD  Result Value Ref Range Status   Specimen Description BLOOD LEFT [facility]  Final   Special Requests   Final    BOTTLES DRAWN AEROBIC AND ANAEROBIC Blood Culture adequate volume   Culture   Final    NO GROWTH 4 DAYS Performed at [facility], [address] [place], [place] [address]    Report Status PENDING  Incomplete  Culture, blood (routine x 2)     Status: None (Preliminary result)   Collection Time: 10/12/19  5:14 AM   Specimen: BLOOD  Result Value Ref Range Status   Specimen Description BLOOD LEFT [facility]  Final  Special Requests   Final    BOTTLES DRAWN AEROBIC AND ANAEROBIC Blood Culture adequate volume   Culture   Final    NO GROWTH 4 DAYS Performed at [facility], [address] [place], [place] [address]    Report Status PENDING  Incomplete  Urine culture     Status: None   Collection Time: 10/12/19  9:10 AM   Specimen: Urine, Random  Result Value Ref Range Status   Specimen Description   Final    URINE, RANDOM Performed at [facility], [address] [place], [place] [address]    Special Requests   Final    NONE Performed at [facility], [address], [place], [place] [address]    Culture   Final    NO GROWTH Performed at [facility], [address], [place], [place] [address]    Report  Status 10/13/2019 FINAL  Final     Labs: Basic Metabolic Panel: Recent Labs  Lab 10/11/19 1837 10/13/19 0645 10/14/19 0513 10/15/19 0615 10/16/19 0717  NA 135 137 137 137 136  K 3.3* 3.6 3.1* 3.9 3.7  CL 103 103 102 102 98  CO2 22 23 25 23 26   GLUCOSE 183* 207* 203* 287* 251*  BUN 8 10 13 12 13   CREATININE 0.35* 0.53 0.33* 0.49 0.49  CALCIUM 8.7* 8.7* 8.6* 8.6* 9.1  MG  --  1.9 2.1 1.9 1.7  PHOS  --  3.1 4.4 3.4 3.7   Liver Function Tests: Recent Labs  Lab 10/13/19 0645 10/14/19 0513 10/15/19 0615 10/16/19 0717  AST 41 34 27 22  ALT 56* 51* 40 33  ALKPHOS 65 65 64 63  BILITOT 0.6 0.7 0.6 0.6  PROT 7.4 7.5 7.2 7.3  ALBUMIN 3.9 3.9 3.7 3.6   No results for input(s): LIPASE, AMYLASE in the last 168 hours. No results for input(s): AMMONIA in the last 168 hours. CBC: Recent Labs  Lab 10/11/19 1837 10/13/19 0645 10/14/19 0513 10/15/19 0615 10/16/19 0717  WBC 7.4 7.1 5.8 8.6 9.3  NEUTROABS  --  4.7 3.3 5.1 6.2  HGB 13.1 12.4 12.7 12.4 12.6  HCT 39.7 37.5 38.6 38.1 38.0  MCV 90.2 90.4 89.8 91.4 90.0  PLT 174 159 165 174 218   Cardiac Enzymes: No results for input(s): CKTOTAL, CKMB, CKMBINDEX, TROPONINI in the last 168 hours. BNP: BNP (last 3 results) Recent Labs    10/12/19 0300  BNP 11.8    ProBNP (last 3 results) No results for input(s): PROBNP in the last 8760 hours.  CBG: Recent Labs  Lab 10/15/19 1114 10/15/19 1624 10/15/19 2126 10/16/19 0816 10/16/19 1156  GLUCAP 300* 311* 292* 245* 376*       Signed:  10/18/19 MD.  [facility] 10/16/2019, 11:57 AM

## 2019-10-16 NOTE — Discharge Instructions (Signed)
COVID-19 COVID-19 El COVID-19 es una infeccin respiratoria causada por un virus llamado coronavirus tipo 2 causante del sndrome respiratorio agudo grave (SARS-CoV-2). La enfermedad tambin se conoce como enfermedad por coronavirus o nuevo coronavirus. En algunas personas, el virus puede no ocasionar sntomas. En otras, puede producir una infeccin grave. La infeccin puede empeorar rpidamente y causar complicaciones, como:  Neumona o infeccin en los pulmones.  Sndrome de dificultad respiratoria aguda o SDRA. Es una afeccin que se caracteriza por la acumulacin de lquido en los pulmones, que impide que los pulmones se llenen de aire y pasen oxgeno a la sangre.  Insuficiencia respiratoria aguda. Es una afeccin que se caracteriza porque no pasa suficiente oxgeno de los pulmones al cuerpo o porque el dixido de carbono no pasa de los pulmones hacia afuera del cuerpo.  Sepsis o choque sptico. Se trata de una reaccin grave del cuerpo ante una infeccin.  Problemas de coagulacin.  Infecciones secundarias debido a bacterias u hongos.  Falla de rganos. Ocurre cuando los rganos del cuerpo dejan de funcionar. El virus que causa el COVID-19 es contagioso. Esto significa que puede transmitirse de una persona a otra a travs de las gotitas de saliva de la tos y de los estornudos (secreciones respiratorias). Cules son las causas? Esta enfermedad es causada por un virus. Usted puede contagiarse con este virus:  Al inspirar las gotitas de una persona infectada. Las gotitas pueden diseminarse cuando una persona respira, habla, canta, tose o estornuda.  Al tocar algo, como una mesa o el picaportes de una puerta, que estuvo expuesto al virus (contaminado) y luego tocarse la boca, nariz o los ojos. Qu incrementa el riesgo? Riesgo de infeccin Es ms probable que se infecte con este virus si:  Se encuentra a una distancia menor a 6 pies (2 metros) de una persona con COVID-19.  Cuida o  vive con una persona infectada con COVID-19.  Pasa tiempo en espacios interiores repletos de gente o vive en viviendas compartidas. Riesgo de enfermedad grave Es ms probable que se enferme gravemente por el virus si:  Tiene 52aos o ms. Cuanto mayor sea su edad, mayor ser el riesgo de tener una enfermedad grave.  Vive en un hogar de ancianos o centro de atencin a largo plazo.  Tiene cncer.  Tiene una enfermedad prolongada (crnica), como las siguientes: ? Enfermedad pulmonar crnica, que incluye la enfermedad pulmonar obstructiva crnica o asma. ? Una enfermedad crnica que disminuye la capacidad del cuerpo para combatir las infecciones (immunocomprometido). ? Enfermedad cardaca, que incluye insuficiencia cardaca, una afeccin que se caracteriza porque las arterias que llegan al corazn se estrechan u obstruyen (arteriopata coronaria) o una enfermedad que provoca que el msculo cardaco se engrose, se debilite o endurezca (miocardiopata). ? Diabetes. ? Enfermedad renal crnica. ? Anemia drepanoctica, una enfermedad que se caracteriza porque los glbulos rojos tienen una forma anormal de "hoz". ? Enfermedad heptica.  Es obeso. Cules son los signos o sntomas? Los sntomas de esta afeccin pueden ser de leves a graves. Los sntomas pueden aparecer en el trmino de 2 a 14 das despus de haber estado expuesto al virus. Incluyen los siguientes:  Fiebre o escalofros.  Tos.  Dificultad para respirar.  Dolores de cabeza, dolores en el cuerpo o dolores musculares.  Secrecin o congestin nasal.  Dolor de garganta.  Nueva prdida del sentido del gusto o del olfato. Algunas personas tambin pueden tener problemas estomacales, como nuseas, vmitos o diarrea. Es posible que otras personas no tengan sntomas de COVID-19.   Cmo se diagnostica? Esta afeccin se puede diagnosticar en funcin de lo siguiente:  Sus signos y sntomas, especialmente si: ? Vive en una zona  donde hay un brote de COVID-19. ? Viaj recientemente a una zona donde el virus es frecuente. ? Cuida o vive con una persona a quien se le diagnostic COVID-19. ? Estuvo expuesto a una persona a la que se le diagnostic COVID-19.  Un examen fsico.  Anlisis de laboratorio que pueden incluir: ? Tomar una muestra de lquido de la parte posterior de la nariz y la garganta (lquido nasofarngeo), la nariz o la garganta, con un hisopo. ? Una muestra de mucosidad de los pulmones (esputo). ? Anlisis de sangre.  Los estudios de diagnstico por imgenes pueden incluir radiografas, exploracin por tomografa computarizada (TC) o ecografa. Cmo se trata? En este momento, no hay ningn medicamento para tratar el COVID-19. Los medicamentos para tratar otras enfermedades se usan a modo de ensayo para comprobar si son eficaces contra el COVID-19. El mdico le informar sobre las maneras de tratar los sntomas. En la mayora de las personas, la infeccin es leve y puede controlarse en el hogar con reposo, lquidos y medicamentos de venta libre. El tratamiento para una infeccin grave suele realizarse en la unidad de cuidados intensivos (UCI) de un hospital. Puede incluir uno o ms de los siguientes. Estos tratamientos se administran hasta que los sntomas mejoran.  Recibir lquidos y medicamentos a travs de una va intravenosa.  Oxgeno complementario. Para administrar oxgeno extra, se utiliza un tubo en la nariz, una mascarilla o una campana de oxgeno.  Colocarlo para que se recueste boca abajo (decbito prono). Esto facilita el ingreso de oxgeno a los pulmones.  Uso continuo de una mquina de presin positiva de las vas areas (CPAP) o de presin positiva de las vas areas de dos niveles (BPAP). Este tratamiento utiliza una presin de aire leve para mantener las vas respiratorias abiertas. Un tubo conectado a un motor administra oxgeno al cuerpo.  Respirador. Este tratamiento mueve el aire  dentro y fuera de los pulmones mediante el uso de un tubo que se coloca en la trquea.  Traqueostoma. En este procedimiento se hace un orificio en el cuello para insertar un tubo de respiracin.  Oxigenacin por membrana extracorprea (OMEC). En este procedimiento, los pulmones tienen la posibilidad de recuperarse al asumir las funciones del corazn y los pulmones. Suministra oxgeno al cuerpo y elimina el dixido de carbono. Siga estas instrucciones en su casa: Estilo de vida  Si est enfermo, qudese en su casa, excepto para obtener atencin mdica. El mdico le indicar cunto tiempo debe quedarse en casa. Llame al mdico antes de buscar atencin mdica.  Haga reposo en su casa como se lo haya indicado el mdico.  No consuma ningn producto que contenga nicotina o tabaco, como cigarrillos, cigarrillos electrnicos y tabaco de mascar. Si necesita ayuda para dejar de fumar, consulte al mdico.  Retome sus actividades normales segn lo indicado por el mdico. Pregntele al mdico qu actividades son seguras para usted. Instrucciones generales  Use los medicamentos de venta libre y los recetados solamente como se lo haya indicado el mdico.  Beba suficiente lquido como para mantener la orina de color amarillo plido.  Concurra a todas las visitas de seguimiento como se lo haya indicado el mdico. Esto es importante. Cmo se evita?  No hay ninguna vacuna que ayude a prevenir la infeccin por COVID-19. Sin embargo, hay medidas que puede tomar para protegerse y proteger a   otras personas de este virus. Para protegerse:   No viaje a zonas donde el COVID-19 sea un riesgo. Las zonas donde se informa la presencia del COVID-19 cambian con frecuencia. Para identificar las zonas de alto riesgo y las restricciones de viaje, consulte el sitio web de viajes de los Centers for Disease Control and Prevention (CDC) (Centros para el Control y la Prevencin de Enfermedades):  wwwnc.cdc.gov/travel/notices  Si vive o debe viajar a una zona donde el COVID-19 es un riesgo, tome precauciones para evitar infecciones. ? Aljese de las personas enfermas. ? Lvese las manos frecuentemente con agua y jabn durante 20segundos. Use desinfectante para manos con alcohol si no dispone de agua y jabn. ? Evite tocarse la boca, la cara, los ojos o la nariz. ? Evite salir de su casa, siga las indicaciones de su estado y de las autoridades sanitarias locales. ? Si debe salir de su casa, use un barbijo de tela o una mascarilla facial. Asegrese de que le cubra la nariz y la boca. ? Evite los espacios interiores repletos de gente. Mantenga una distancia de al menos 6 pies (2 metros) de otras personas. ? Desinfecte los objetos y las superficies que se tocan con frecuencia todos los das. Pueden incluir:  Encimeras y mesas.  Picaportes e interruptores de luz.  Lavabos, fregaderos y grifos.  Aparatos electrnicos tales como telfonos, controles remotos, teclados, computadoras y tabletas. Cmo proteger a los dems: Si tiene sntomas de COVID-19, tome medidas para evitar que el virus se propague a otras personas.  Si cree que tiene una infeccin por COVID-19, comunquese de inmediato con su mdico. Informe al equipo de atencin mdica que cree que puede tener una infeccin por el COVID-19.  Qudese en su casa. Salga de su casa solo para buscar atencin mdica. No utilice el transporte pblico.  No viaje mientras est enfermo.  Lvese las manos frecuentemente con agua y jabn durante 20segundos. Usar desinfectante para manos con alcohol si no dispone de agua y jabn.  Mantngase alejado de quienes vivan con usted. Permita que los miembros de la familia sanos cuiden a los nios y las mascotas, si es posible. Si tiene que cuidar a los nios o las mascotas, lvese las manos con frecuencia y use un barbijo. Si es posible, permanezca en su habitacin, separado de los dems. Utilice un  bao diferente.  Asegrese de que todas las personas que viven en su casa se laven bien las manos y con frecuencia.  Tosa o estornude en un pauelo de papel o sobre su manga o codo. No tosa o estornude al aire ni se cubra la boca o la nariz con la mano.  Use un barbijo de tela o una mascarilla facial. Asegrese de que le cubra la nariz y la boca. Dnde buscar ms informacin  Centers for Disease Control and Prevention (Centros para el Control y la Prevencin de Enfermedades): www.cdc.gov/coronavirus/2019-ncov/index.html  World Health Organization (Organizacin Mundial de la Salud): www.who.int/health-topics/coronavirus Comunquese con un mdico si:  Vive o ha viajado a una zona donde el COVID-19 es un riesgo y tiene sntomas de infeccin.  Ha tenido contacto con alguien que tiene COVID-19 y usted tiene sntomas de infeccin. Solicite ayuda inmediatamente si:  Tiene dificultad para respirar.  Siente dolor u opresin en el pecho.  Experimenta confusin.  Tiene las uas de los dedos y los labios de color azulado.  Tiene dificultad para despertarse.  Los sntomas empeoran. Estos sntomas pueden representar un problema grave que constituye una emergencia. No espere   a ver si los sntomas desaparecen. Solicite atencin mdica de inmediato. Comunquese con el servicio de emergencias de su localidad (911 en los [place]). No conduzca por sus propios medios [facility] hospital. Informe al personal mdico de emergencias si cree que tiene COVID-19. Resumen  El COVID-19 es una infeccin respiratoria causada por un virus. Tambin se conoce como enfermedad por coronavirus o nuevo coronavirus. Puede causar infecciones graves, como neumona, sndrome de dificultad respiratoria aguda, insuficiencia respiratoria aguda o sepsis.  El virus que causa el COVID-19 es contagioso. Esto significa que puede transmitirse de [place] persona a otra a travs de las gotitas que se despiden al respirar, Heritage manager,  cantar, toser y Engineering geologist.  Es ms probable que desarrolle una enfermedad grave si tiene 50 aos o ms, tiene el sistema inmunitario dbil, vive en un hogar de ancianos o tiene una enfermedad crnica.  No hay ningn medicamento para tratar el COVID-19. El mdico le informar sobre las maneras de tratar los sntomas.  Tome medidas para protegerse y Conservator, museum/gallery a los Merchandiser, retail las infecciones. Lvese las manos con frecuencia y desinfecte los objetos y las superficies que se tocan con frecuencia todos [place]. Mantngase alejado de las personas que estn enfermas y use un barbijo si est enfermo. Esta informacin no tiene Theme park manager el consejo del mdico. Asegrese de hacerle al mdico cualquier pregunta que tenga. Document Revised: 12/16/2018 Document Reviewed: 04/10/2018 Elsevier Patient Education  2020 [employer].

## 2019-10-17 LAB — CULTURE, BLOOD (ROUTINE X 2)
Culture: NO GROWTH
Culture: NO GROWTH
Special Requests: ADEQUATE
Special Requests: ADEQUATE

## 2019-10-30 ENCOUNTER — Emergency Department
Admission: EM | Admit: 2019-10-30 | Discharge: 2019-10-30 | Disposition: A | Discharge status: Home / Self Care | Payer: BLUE CROSS/BLUE SHIELD | Attending: Emergency Medicine | Admitting: Emergency Medicine

## 2019-10-30 ENCOUNTER — Other Ambulatory Visit: Payer: Self-pay

## 2019-10-30 DIAGNOSIS — Z5321 Procedure and treatment not carried out due to patient leaving prior to being seen by health care provider: Secondary | ICD-10-CM | POA: Insufficient documentation

## 2019-10-30 DIAGNOSIS — R109 Unspecified abdominal pain: Secondary | ICD-10-CM | POA: Diagnosis present

## 2019-10-30 LAB — CBC WITH DIFFERENTIAL/PLATELET
Abs Immature Granulocytes: 0.04 10*3/uL (ref 0.00–0.07)
Basophils Absolute: 0.1 10*3/uL (ref 0.0–0.1)
Basophils Relative: 0 %
Eosinophils Absolute: 0.2 10*3/uL (ref 0.0–0.5)
Eosinophils Relative: 1 %
HCT: 33.6 % — ABNORMAL LOW (ref 36.0–46.0)
Hemoglobin: 12.4 g/dL (ref 12.0–15.0)
Immature Granulocytes: 0 %
Lymphocytes Relative: 22 %
Lymphs Abs: 3.2 10*3/uL (ref 0.7–4.0)
MCH: 32 pg (ref 26.0–34.0)
MCHC: 36.9 g/dL — ABNORMAL HIGH (ref 30.0–36.0)
MCV: 86.8 fL (ref 80.0–100.0)
Monocytes Absolute: 0.8 10*3/uL (ref 0.1–1.0)
Monocytes Relative: 5 %
Neutro Abs: 10.5 10*3/uL — ABNORMAL HIGH (ref 1.7–7.7)
Neutrophils Relative %: 72 %
Platelets: 243 10*3/uL (ref 150–400)
RBC: 3.87 MIL/uL (ref 3.87–5.11)
RDW: 14.2 % (ref 11.5–15.5)
WBC: 14.7 10*3/uL — ABNORMAL HIGH (ref 4.0–10.5)
nRBC: 0.4 % — ABNORMAL HIGH (ref 0.0–0.2)

## 2019-10-30 LAB — COMPREHENSIVE METABOLIC PANEL
ALT: 32 U/L (ref 0–44)
AST: 33 U/L (ref 15–41)
Albumin: 4.1 g/dL (ref 3.5–5.0)
Alkaline Phosphatase: 70 U/L (ref 38–126)
Anion gap: 12 (ref 5–15)
BUN: 7 mg/dL (ref 6–20)
CO2: 19 mmol/L — ABNORMAL LOW (ref 22–32)
Calcium: 8.5 mg/dL — ABNORMAL LOW (ref 8.9–10.3)
Chloride: 92 mmol/L — ABNORMAL LOW (ref 98–111)
Creatinine, Ser: 0.32 mg/dL — ABNORMAL LOW (ref 0.44–1.00)
GFR calc Af Amer: >60 mL/min (ref >60)
GFR calc non Af Amer: >60 mL/min (ref >60)
Glucose, Bld: 275 mg/dL — ABNORMAL HIGH (ref 70–99)
Potassium: 3.3 mmol/L — ABNORMAL LOW (ref 3.5–5.1)
Sodium: 123 mmol/L — ABNORMAL LOW (ref 135–145)
Total Bilirubin: 1.3 mg/dL — ABNORMAL HIGH (ref 0.3–1.2)
Total Protein: 5.6 g/dL — ABNORMAL LOW (ref 6.5–8.1)

## 2019-10-30 LAB — LIPASE, BLOOD: Lipase: 30 U/L (ref 11–51)

## 2019-10-30 MED ORDER — FENTANYL CITRATE (PF) 100 MCG/2ML IJ SOLN
50.0000 ug | INTRAMUSCULAR | Status: DC | PRN
  Administered 2019-10-30: 50 ug via NASAL
  Filled 2019-10-30: qty 2

## 2019-10-30 NOTE — ED Triage Notes (Signed)
Patient reports having left sided abdominal pain all day.

## 2019-10-30 NOTE — ED Triage Notes (Signed)
FIRST NURSE NOTE: Pt arrived via [facility] from home with c/o possible pancreatitis, pt has hx of the same x2 episodes. Pt states the pain started around 11am, worse around 3pm, CBG 293.

## 2019-10-31 ENCOUNTER — Telehealth: Payer: Self-pay | Admitting: Emergency Medicine

## 2019-10-31 NOTE — Telephone Encounter (Signed)
Called patient due to lwot to inquire about condition and follow up plans. Mobile number-no answer and no voicemail. Home number--mailbox was full, but the message said a business name.

## 2019-12-15 IMAGING — CT CT RENAL STONE PROTOCOL
3 of 4 series · 10 of 46 positions shown, 15 images · non-contrast
Comparison: CT 10/21/2014

CLINICAL DATA: Flank pain, stone disease suspected. Patient reports
left flank pain.

EXAM:
CT ABDOMEN AND PELVIS WITHOUT CONTRAST
TECHNIQUE: Multidetector CT imaging of the abdomen and pelvis was performed
following the standard protocol without IV contrast.

[Series 4: lung bases · axial · 0.77mm/px · z∈[-597,-492]mm · 6 of 31 slices shown, 11 images]
[im 5/31  soft-tissue]
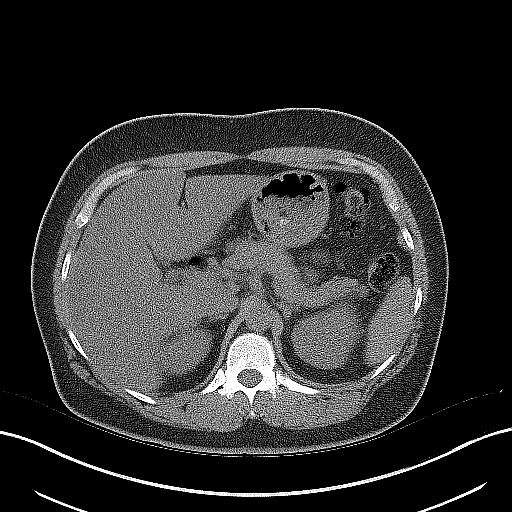
[im 5/31  bone]
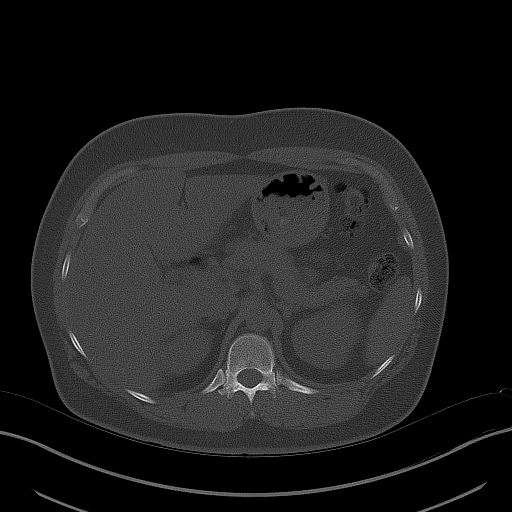
[im 9/31  soft-tissue]
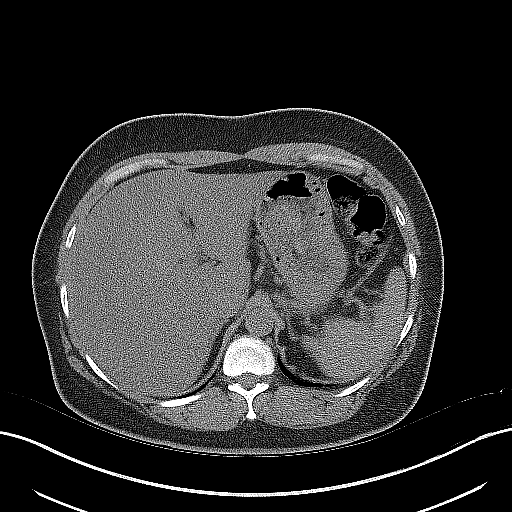
[im 13/31  soft-tissue]
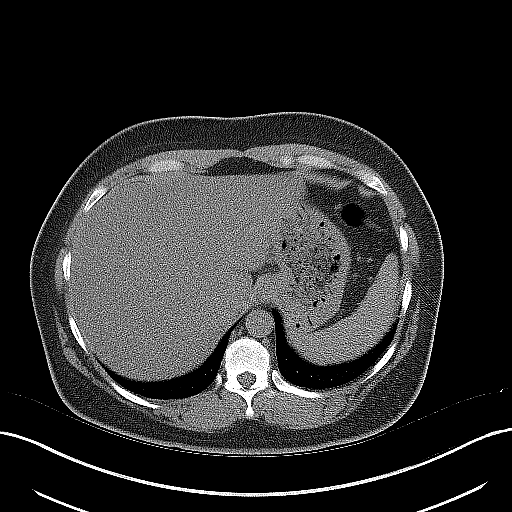
[im 13/31  lung]
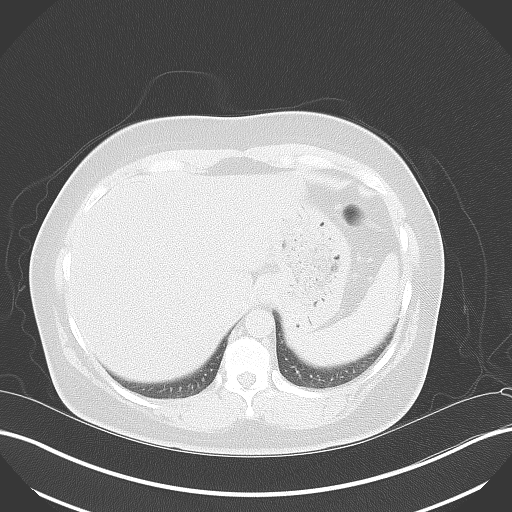
[im 18/31  soft-tissue]
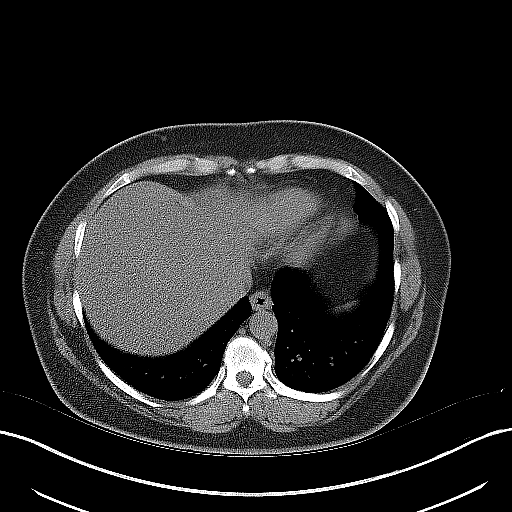
[im 18/31  lung]
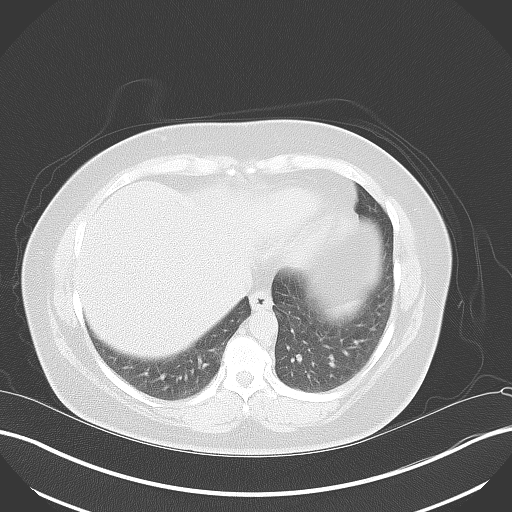
[im 22/31  soft-tissue]
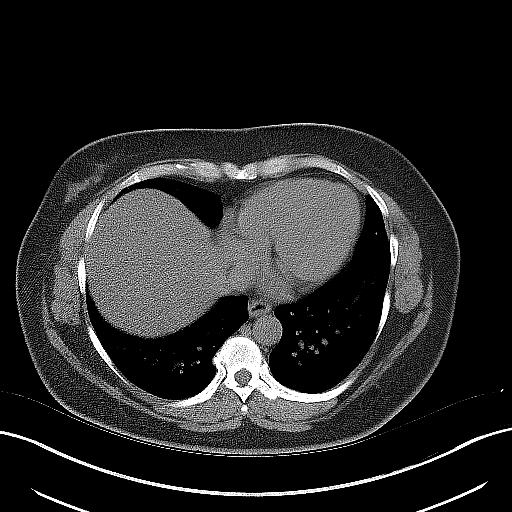
[im 22/31  lung]
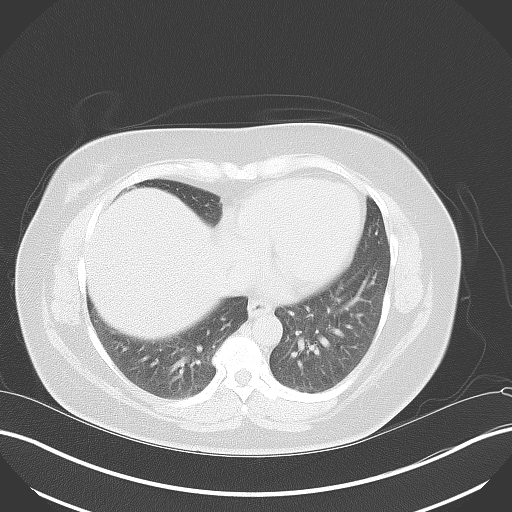
[im 26/31  soft-tissue]
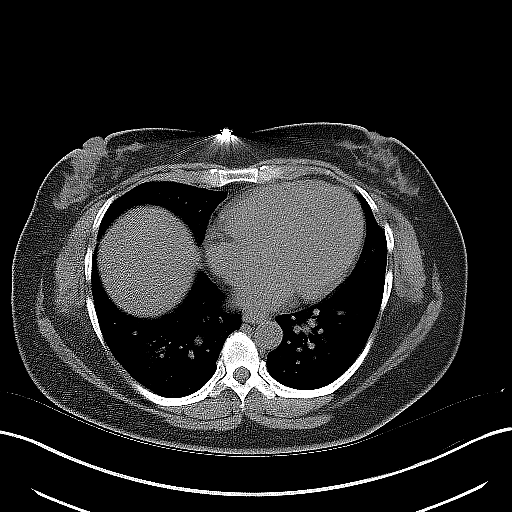
[im 26/31  lung]
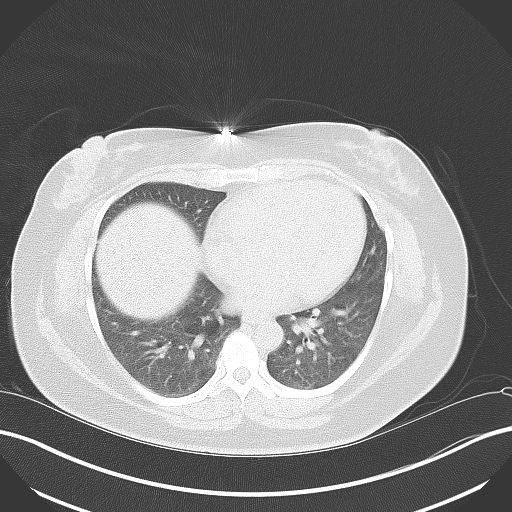

[Series 5: coronal · coronal · 0.71mm/px · 3 of 125 slices shown]
[im 42/125  soft-tissue]
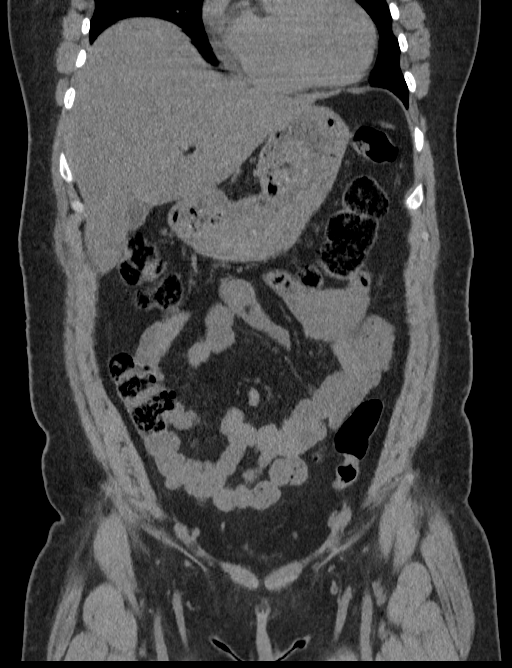
[im 56/125  soft-tissue]
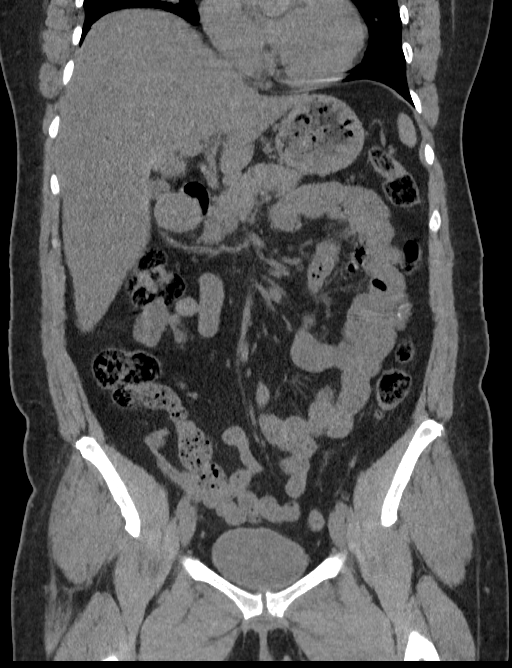
[im 69/125  soft-tissue]
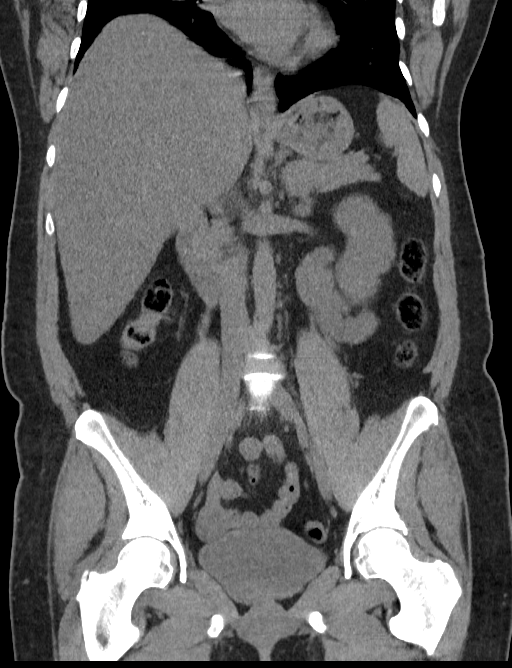

[Series 6: sagittal · sagittal · 0.49mm/px · 1 of 183 slices shown]
[im 61/183  soft-tissue]
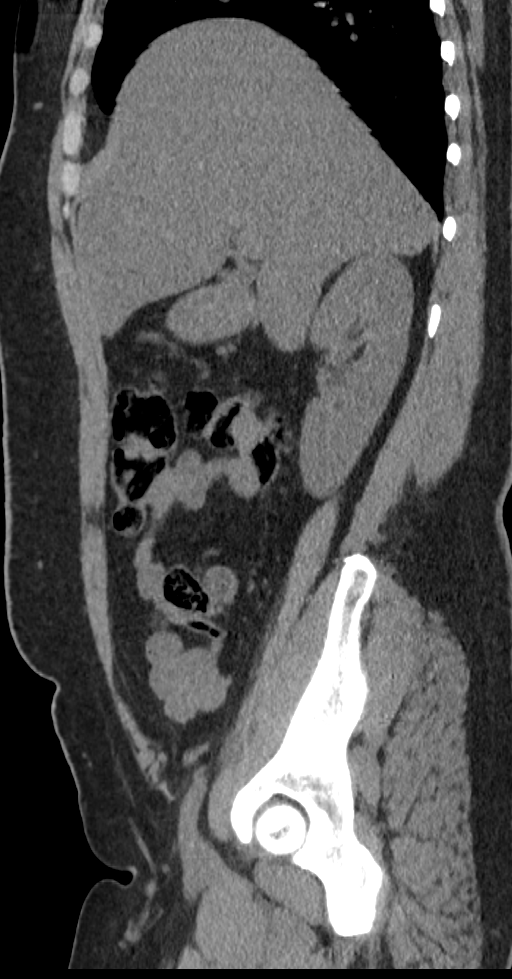

[10 of 46 positions shown; findings below may reference images not displayed]

FINDINGS: Lower chest: The lung bases are clear.

Hepatobiliary: Liver is enlarged spanning 24 cm cranial caudal with
steatosis. Focal fatty sparing adjacent to the gallbladder fossa.
Gallbladder is partially distended. No calcified gallstone or
pericholecystic inflammation.

Pancreas: No ductal dilatation or inflammation.

Spleen: Normal in size without focal abnormality.

Adrenals/Urinary Tract: Normal adrenal glands. No renal stones or
hydronephrosis. No perinephric edema. Both ureters are decompressed
without stones along the course. Urinary bladder is physiologically
distended. No wall thickening or stone.

Stomach/Bowel: Bowel evaluation is limited in the absence of enteric
contrast. Ingested material in the stomach. No bowel obstruction or
inflammatory change. Appendix tentatively identified. No evidence of
appendicitis. Small to moderate colonic stool burden without colonic
wall thickening.

Vascular/Lymphatic: Abdominal aorta is normal in caliber. Few
prominent periportal nodes are likely reactive.

Reproductive: Status post hysterectomy. No adnexal masses.

Other: Subcutaneous soft tissue densities in the anterior abdominal
wall typical of medication injection sites. No intra-abdominal
ascites or free air. Small fat containing supraumbilical ventral
abdominal wall hernia without bowel involvement or inflammation.

Musculoskeletal: There are no acute or suspicious osseous
abnormalities.
IMPRESSION: 1. No renal stones or obstructive uropathy. No acute abnormality in
the abdomen/pelvis.
2. Hepatomegaly and hepatic steatosis.

## 2020-02-22 ENCOUNTER — Encounter: Payer: Self-pay | Admitting: *Deleted

## 2020-02-22 ENCOUNTER — Other Ambulatory Visit: Payer: Self-pay

## 2020-02-22 ENCOUNTER — Emergency Department: Payer: BLUE CROSS/BLUE SHIELD

## 2020-02-22 DIAGNOSIS — Z20822 Contact with and (suspected) exposure to covid-19: Secondary | ICD-10-CM | POA: Diagnosis not present

## 2020-02-22 DIAGNOSIS — Z91040 Latex allergy status: Secondary | ICD-10-CM | POA: Diagnosis not present

## 2020-02-22 DIAGNOSIS — R109 Unspecified abdominal pain: Secondary | ICD-10-CM | POA: Diagnosis present

## 2020-02-22 DIAGNOSIS — Z79899 Other long term (current) drug therapy: Secondary | ICD-10-CM | POA: Diagnosis not present

## 2020-02-22 DIAGNOSIS — E119 Type 2 diabetes mellitus without complications: Secondary | ICD-10-CM | POA: Insufficient documentation

## 2020-02-22 DIAGNOSIS — Z794 Long term (current) use of insulin: Secondary | ICD-10-CM | POA: Diagnosis not present

## 2020-02-22 LAB — CBC
HCT: 35.9 % — ABNORMAL LOW (ref 36.0–46.0)
Hemoglobin: 11.9 g/dL — ABNORMAL LOW (ref 12.0–15.0)
MCH: 28.8 pg (ref 26.0–34.0)
MCHC: 33.1 g/dL (ref 30.0–36.0)
MCV: 86.9 fL (ref 80.0–100.0)
Platelets: 244 10*3/uL (ref 150–400)
RBC: 4.13 MIL/uL (ref 3.87–5.11)
RDW: 15.2 % (ref 11.5–15.5)
WBC: 18.2 10*3/uL — ABNORMAL HIGH (ref 4.0–10.5)
nRBC: 0 % (ref 0.0–0.2)

## 2020-02-22 LAB — TROPONIN I (HIGH SENSITIVITY): Troponin I (High Sensitivity): 33 ng/L — ABNORMAL HIGH (ref <18)

## 2020-02-22 NOTE — ED Triage Notes (Signed)
Pt today she is having left sided abdominal pain, chest pain and back pain, feels similar to pancreatitis. Vomiting. No diarrhea or fevers.

## 2020-02-23 ENCOUNTER — Inpatient Hospital Stay (HOSPITAL_COMMUNITY): Payer: BLUE CROSS/BLUE SHIELD

## 2020-02-23 ENCOUNTER — Emergency Department: Payer: BLUE CROSS/BLUE SHIELD

## 2020-02-23 ENCOUNTER — Inpatient Hospital Stay (HOSPITAL_COMMUNITY)
Admission: EM | Admit: 2020-02-23 | Discharge: 2020-02-29 | DRG: 871 | Disposition: A | Discharge status: Home / Self Care | Payer: BLUE CROSS/BLUE SHIELD | Attending: Internal Medicine | Admitting: Internal Medicine

## 2020-02-23 ENCOUNTER — Emergency Department
Admission: EM | Admit: 2020-02-23 | Discharge: 2020-02-23 | Disposition: A | Discharge status: Other Acute Inpatient Hospital | Payer: BLUE CROSS/BLUE SHIELD | Attending: Emergency Medicine | Admitting: Emergency Medicine

## 2020-02-23 DIAGNOSIS — E1110 Type 2 diabetes mellitus with ketoacidosis without coma: Secondary | ICD-10-CM | POA: Diagnosis present

## 2020-02-23 DIAGNOSIS — E871 Hypo-osmolality and hyponatremia: Secondary | ICD-10-CM | POA: Diagnosis present

## 2020-02-23 DIAGNOSIS — E781 Pure hyperglyceridemia: Secondary | ICD-10-CM

## 2020-02-23 DIAGNOSIS — I10 Essential (primary) hypertension: Secondary | ICD-10-CM | POA: Diagnosis present

## 2020-02-23 DIAGNOSIS — Z880 Allergy status to penicillin: Secondary | ICD-10-CM | POA: Diagnosis not present

## 2020-02-23 DIAGNOSIS — Z8616 Personal history of COVID-19: Secondary | ICD-10-CM

## 2020-02-23 DIAGNOSIS — E1100 Type 2 diabetes mellitus with hyperosmolarity without nonketotic hyperglycemic-hyperosmolar coma (NKHHC): Secondary | ICD-10-CM | POA: Diagnosis not present

## 2020-02-23 DIAGNOSIS — K858 Other acute pancreatitis without necrosis or infection: Secondary | ICD-10-CM | POA: Diagnosis present

## 2020-02-23 DIAGNOSIS — E785 Hyperlipidemia, unspecified: Secondary | ICD-10-CM

## 2020-02-23 DIAGNOSIS — Z794 Long term (current) use of insulin: Secondary | ICD-10-CM | POA: Diagnosis not present

## 2020-02-23 DIAGNOSIS — K859 Acute pancreatitis without necrosis or infection, unspecified: Secondary | ICD-10-CM | POA: Diagnosis present

## 2020-02-23 DIAGNOSIS — E876 Hypokalemia: Secondary | ICD-10-CM | POA: Diagnosis present

## 2020-02-23 DIAGNOSIS — R6521 Severe sepsis with septic shock: Secondary | ICD-10-CM | POA: Diagnosis present

## 2020-02-23 DIAGNOSIS — Z91040 Latex allergy status: Secondary | ICD-10-CM | POA: Diagnosis not present

## 2020-02-23 DIAGNOSIS — R109 Unspecified abdominal pain: Secondary | ICD-10-CM | POA: Diagnosis not present

## 2020-02-23 DIAGNOSIS — E119 Type 2 diabetes mellitus without complications: Secondary | ICD-10-CM | POA: Diagnosis present

## 2020-02-23 DIAGNOSIS — E1169 Type 2 diabetes mellitus with other specified complication: Secondary | ICD-10-CM | POA: Diagnosis not present

## 2020-02-23 DIAGNOSIS — A419 Sepsis, unspecified organism: Principal | ICD-10-CM | POA: Diagnosis present

## 2020-02-23 DIAGNOSIS — R1084 Generalized abdominal pain: Secondary | ICD-10-CM | POA: Diagnosis not present

## 2020-02-23 DIAGNOSIS — R945 Abnormal results of liver function studies: Secondary | ICD-10-CM | POA: Diagnosis not present

## 2020-02-23 DIAGNOSIS — E1165 Type 2 diabetes mellitus with hyperglycemia: Secondary | ICD-10-CM | POA: Diagnosis not present

## 2020-02-23 DIAGNOSIS — K219 Gastro-esophageal reflux disease without esophagitis: Secondary | ICD-10-CM | POA: Diagnosis present

## 2020-02-23 DIAGNOSIS — K5900 Constipation, unspecified: Secondary | ICD-10-CM | POA: Diagnosis present

## 2020-02-23 DIAGNOSIS — R7989 Other specified abnormal findings of blood chemistry: Secondary | ICD-10-CM | POA: Diagnosis present

## 2020-02-23 DIAGNOSIS — D72829 Elevated white blood cell count, unspecified: Secondary | ICD-10-CM | POA: Diagnosis present

## 2020-02-23 DIAGNOSIS — R52 Pain, unspecified: Secondary | ICD-10-CM

## 2020-02-23 DIAGNOSIS — Z79899 Other long term (current) drug therapy: Secondary | ICD-10-CM | POA: Diagnosis not present

## 2020-02-23 DIAGNOSIS — M7918 Myalgia, other site: Secondary | ICD-10-CM | POA: Diagnosis not present

## 2020-02-23 LAB — CBG MONITORING, ED
Glucose-Capillary: 223 mg/dL — ABNORMAL HIGH (ref 70–99)
Glucose-Capillary: 214 mg/dL — ABNORMAL HIGH (ref 70–99)
Glucose-Capillary: 170 mg/dL — ABNORMAL HIGH (ref 70–99)
Glucose-Capillary: 154 mg/dL — ABNORMAL HIGH (ref 70–99)
Glucose-Capillary: 187 mg/dL — ABNORMAL HIGH (ref 70–99)
Glucose-Capillary: 171 mg/dL — ABNORMAL HIGH (ref 70–99)
Glucose-Capillary: 159 mg/dL — ABNORMAL HIGH (ref 70–99)

## 2020-02-23 LAB — URINALYSIS, COMPLETE (UACMP) WITH MICROSCOPIC
Bacteria, UA: NONE SEEN
Bilirubin Urine: NEGATIVE
Glucose, UA: >=500 mg/dL — AB
Ketones, ur: 80 mg/dL — AB
Leukocytes,Ua: NEGATIVE
Nitrite: NEGATIVE
Protein, ur: 100 mg/dL — AB
Specific Gravity, Urine: 1.044 — ABNORMAL HIGH (ref 1.005–1.030)
pH: 5 (ref 5.0–8.0)

## 2020-02-23 LAB — OSMOLALITY: Osmolality: 284 mOsm/kg (ref 275–295)

## 2020-02-23 LAB — COMPREHENSIVE METABOLIC PANEL
ALT: UNDETERMINED U/L (ref 0–44)
ALT: 25 U/L (ref 0–44)
AST: UNDETERMINED U/L (ref 15–41)
AST: 43 U/L — ABNORMAL HIGH (ref 15–41)
Albumin: 4 g/dL (ref 3.5–5.0)
Albumin: 3.9 g/dL (ref 3.5–5.0)
Alkaline Phosphatase: 60 U/L (ref 38–126)
Alkaline Phosphatase: 59 U/L (ref 38–126)
Anion gap: 11 (ref 5–15)
Anion gap: 22 — ABNORMAL HIGH (ref 5–15)
BUN: 7 mg/dL (ref 6–20)
BUN: 7 mg/dL (ref 6–20)
CO2: 19 mmol/L — ABNORMAL LOW (ref 22–32)
CO2: 19 mmol/L — ABNORMAL LOW (ref 22–32)
Calcium: 8.6 mg/dL — ABNORMAL LOW (ref 8.9–10.3)
Calcium: 8.7 mg/dL — ABNORMAL LOW (ref 8.9–10.3)
Chloride: 94 mmol/L — ABNORMAL LOW (ref 98–111)
Chloride: 92 mmol/L — ABNORMAL LOW (ref 98–111)
Creatinine, Ser: 0.47 mg/dL (ref 0.44–1.00)
Creatinine, Ser: 0.42 mg/dL — ABNORMAL LOW (ref 0.44–1.00)
GFR, Estimated: >60 mL/min (ref >60)
GFR, Estimated: >60 mL/min (ref >60)
Glucose, Bld: 257 mg/dL — ABNORMAL HIGH (ref 70–99)
Glucose, Bld: 254 mg/dL — ABNORMAL HIGH (ref 70–99)
Potassium: 4.9 mmol/L (ref 3.5–5.1)
Potassium: 4.7 mmol/L (ref 3.5–5.1)
Sodium: 124 mmol/L — ABNORMAL LOW (ref 135–145)
Sodium: 133 mmol/L — ABNORMAL LOW (ref 135–145)
Total Bilirubin: 3.4 mg/dL — ABNORMAL HIGH (ref 0.3–1.2)
Total Bilirubin: 3.4 mg/dL — ABNORMAL HIGH (ref 0.3–1.2)
Total Protein: 5.8 g/dL — ABNORMAL LOW (ref 6.5–8.1)
Total Protein: 6 g/dL — ABNORMAL LOW (ref 6.5–8.1)

## 2020-02-23 LAB — CBC
HCT: 37.2 % (ref 36.0–46.0)
Hemoglobin: 12.3 g/dL (ref 12.0–15.0)
MCH: 29.2 pg (ref 26.0–34.0)
MCHC: 33.1 g/dL (ref 30.0–36.0)
MCV: 88.4 fL (ref 80.0–100.0)
Platelets: 203 10*3/uL (ref 150–400)
RBC: 4.21 MIL/uL (ref 3.87–5.11)
RDW: 13.3 % (ref 11.5–15.5)
WBC: 17.4 10*3/uL — ABNORMAL HIGH (ref 4.0–10.5)
nRBC: 0 % (ref 0.0–0.2)

## 2020-02-23 LAB — LIPASE, BLOOD: Lipase: 50 U/L (ref 11–51)

## 2020-02-23 LAB — LIPID PANEL
Cholesterol: 696 mg/dL — ABNORMAL HIGH (ref 0–200)
LDL Cholesterol: UNDETERMINED mg/dL (ref 0–99)
Triglycerides: >5000 mg/dL — ABNORMAL HIGH (ref <150)
VLDL: UNDETERMINED mg/dL (ref 0–40)

## 2020-02-23 LAB — APTT: aPTT: 36 seconds (ref 24–36)

## 2020-02-23 LAB — HEMOGLOBIN A1C
Hgb A1c MFr Bld: 10.8 % — ABNORMAL HIGH (ref 4.8–5.6)
Mean Plasma Glucose: 263.26 mg/dL

## 2020-02-23 LAB — LACTIC ACID, PLASMA: Lactic Acid, Venous: 1.4 mmol/L (ref 0.5–1.9)

## 2020-02-23 LAB — SARS CORONAVIRUS 2 (TAT 6-24 HRS): SARS Coronavirus 2: NEGATIVE

## 2020-02-23 LAB — PROTIME-INR
INR: 1.1 (ref 0.8–1.2)
Prothrombin Time: 13.3 seconds (ref 11.4–15.2)

## 2020-02-23 LAB — CREATININE, SERUM: Creatinine, Ser: <0.3 mg/dL — ABNORMAL LOW (ref 0.44–1.00)

## 2020-02-23 LAB — TROPONIN I (HIGH SENSITIVITY): Troponin I (High Sensitivity): 3 ng/L (ref <18)

## 2020-02-23 MED ORDER — HEPARIN SODIUM (PORCINE) 1000 UNIT/ML IJ SOLN
INTRAMUSCULAR | Status: AC
  Filled 2020-02-23: qty 1

## 2020-02-23 MED ORDER — LACTATED RINGERS IV BOLUS (SEPSIS)
1000.0000 mL | Freq: Once | INTRAVENOUS | Status: AC
  Administered 2020-02-23: 22:00:00 1000 mL via INTRAVENOUS

## 2020-02-23 MED ORDER — ACETAMINOPHEN 325 MG PO TABS
650.0000 mg | ORAL_TABLET | Freq: Four times a day (QID) | ORAL | Status: DC | PRN
  Administered 2020-02-23 – 2020-02-28 (×6): 650 mg via ORAL
  Filled 2020-02-23 (×6): qty 2

## 2020-02-23 MED ORDER — LACTATED RINGERS IV SOLN: INTRAVENOUS | Status: DC

## 2020-02-23 MED ORDER — DEXTROSE 50 % IV SOLN: 0.0000 mL | INTRAVENOUS | Status: DC | PRN

## 2020-02-23 MED ORDER — DEXTROSE IN LACTATED RINGERS 5 % IV SOLN: INTRAVENOUS | Status: DC

## 2020-02-23 MED ORDER — MORPHINE SULFATE (PF) 4 MG/ML IV SOLN
4.0000 mg | Freq: Once | INTRAVENOUS | Status: AC
Start: 2020-02-23 — End: 2020-02-23
  Administered 2020-02-23: 16:00:00 4 mg via INTRAVENOUS
  Filled 2020-02-23: qty 1

## 2020-02-23 MED ORDER — OXYCODONE HCL 5 MG PO TABS
5.0000 mg | ORAL_TABLET | ORAL | Status: DC | PRN
  Administered 2020-02-24 – 2020-02-26 (×7): 5 mg via ORAL
  Filled 2020-02-23 (×7): qty 1

## 2020-02-23 MED ORDER — INSULIN (MYXREDLIN) INFUSION FOR HYPERTRIGLYCERIDEMIA: 0.1000 [IU]/kg/h | INTRAVENOUS | Status: DC

## 2020-02-23 MED ORDER — ACETAMINOPHEN 650 MG RE SUPP: 650.0000 mg | Freq: Four times a day (QID) | RECTAL | Status: DC | PRN

## 2020-02-23 MED ORDER — DEXTROSE 5 % IV SOLN: INTRAVENOUS | Status: DC

## 2020-02-23 MED ORDER — ALBUTEROL SULFATE HFA 108 (90 BASE) MCG/ACT IN AERS
2.0000 | INHALATION_SPRAY | RESPIRATORY_TRACT | Status: DC | PRN
  Filled 2020-02-23: qty 6.7

## 2020-02-23 MED ORDER — IOHEXOL 300 MG/ML  SOLN
100.0000 mL | Freq: Once | INTRAMUSCULAR | Status: AC | PRN
  Administered 2020-02-23: 08:00:00 100 mL via INTRAVENOUS

## 2020-02-23 MED ORDER — SODIUM CHLORIDE 0.9 % IV SOLN: INTRAVENOUS | Status: DC

## 2020-02-23 MED ORDER — OXYCODONE-ACETAMINOPHEN 5-325 MG PO TABS
1.0000 | ORAL_TABLET | ORAL | Status: AC | PRN
  Administered 2020-02-23 (×2): 1 via ORAL
  Filled 2020-02-23 (×2): qty 1

## 2020-02-23 MED ORDER — MORPHINE SULFATE (PF) 4 MG/ML IV SOLN
4.0000 mg | Freq: Once | INTRAVENOUS | Status: AC
  Administered 2020-02-23: 06:00:00 4 mg via INTRAVENOUS
  Filled 2020-02-23: qty 1

## 2020-02-23 MED ORDER — HYDROMORPHONE HCL 1 MG/ML IJ SOLN
0.5000 mg | INTRAMUSCULAR | Status: DC | PRN
  Administered 2020-02-23 – 2020-02-27 (×17): 1 mg via INTRAVENOUS
  Filled 2020-02-23 (×18): qty 1

## 2020-02-23 MED ORDER — PANTOPRAZOLE SODIUM 40 MG IV SOLR
40.0000 mg | INTRAVENOUS | Status: DC
  Administered 2020-02-23 – 2020-02-28 (×6): 40 mg via INTRAVENOUS
  Filled 2020-02-23 (×6): qty 40

## 2020-02-23 MED ORDER — POTASSIUM CHLORIDE CRYS ER 20 MEQ PO TBCR
40.0000 meq | EXTENDED_RELEASE_TABLET | Freq: Once | ORAL | Status: AC
  Administered 2020-02-23: 14:00:00 40 meq via ORAL
  Filled 2020-02-23: qty 2

## 2020-02-23 MED ORDER — HEPARIN SODIUM (PORCINE) 1000 UNIT/ML IJ SOLN
INTRAMUSCULAR | Status: AC | PRN
  Administered 2020-02-23: 1000 [IU]

## 2020-02-23 MED ORDER — LIDOCAINE HCL 1 % IJ SOLN
INTRAMUSCULAR | Status: AC | PRN
  Administered 2020-02-23: 20 mL via INTRADERMAL

## 2020-02-23 MED ORDER — OXYCODONE HCL 5 MG PO TABS: 5.0000 mg | ORAL_TABLET | ORAL | Status: DC | PRN

## 2020-02-23 MED ORDER — INSULIN (MYXREDLIN) INFUSION FOR HYPERTRIGLYCERIDEMIA
0.2000 [IU]/kg/h | INTRAVENOUS | Status: DC
  Administered 2020-02-23: 14:00:00 0.2 [IU]/kg/h via INTRAVENOUS
  Filled 2020-02-23: qty 100

## 2020-02-23 MED ORDER — SODIUM CHLORIDE 0.9 % IV SOLN
1.0000 g | Freq: Three times a day (TID) | INTRAVENOUS | Status: DC
  Filled 2020-02-23 (×2): qty 1

## 2020-02-23 MED ORDER — SODIUM CHLORIDE 0.9 % IV BOLUS
1000.0000 mL | Freq: Once | INTRAVENOUS | Status: AC
  Administered 2020-02-23: 06:00:00 1000 mL via INTRAVENOUS

## 2020-02-23 MED ORDER — PANTOPRAZOLE SODIUM 40 MG PO TBEC: 40.0000 mg | DELAYED_RELEASE_TABLET | Freq: Every day | ORAL | Status: DC

## 2020-02-23 MED ORDER — LIDOCAINE HCL 1 % IJ SOLN
INTRAMUSCULAR | Status: AC
  Filled 2020-02-23: qty 20

## 2020-02-23 MED ORDER — LIDOCAINE HCL 1 % IJ SOLN
INTRAMUSCULAR | Status: AC | PRN
Start: 2020-02-23 — End: 2020-02-23
  Administered 2020-02-23: 10 mL

## 2020-02-23 MED ORDER — INSULIN ASPART 100 UNIT/ML ~~LOC~~ SOLN: 0.0000 [IU] | Freq: Every day | SUBCUTANEOUS | Status: DC

## 2020-02-23 MED ORDER — ATORVASTATIN CALCIUM 10 MG PO TABS
20.0000 mg | ORAL_TABLET | Freq: Every day | ORAL | Status: DC
  Administered 2020-02-24 – 2020-02-28 (×5): 20 mg via ORAL
  Filled 2020-02-23 (×5): qty 2

## 2020-02-23 MED ORDER — HYDROMORPHONE HCL 1 MG/ML IJ SOLN
1.0000 mg | INTRAMUSCULAR | Status: DC | PRN
  Administered 2020-02-23: 1 mg via INTRAVENOUS
  Filled 2020-02-23: qty 1

## 2020-02-23 MED ORDER — FENOFIBRATE 54 MG PO TABS
54.0000 mg | ORAL_TABLET | Freq: Every day | ORAL | Status: DC
  Administered 2020-02-24 – 2020-02-29 (×6): 54 mg via ORAL
  Filled 2020-02-23 (×6): qty 1

## 2020-02-23 MED ORDER — ONDANSETRON HCL 4 MG/2ML IJ SOLN
4.0000 mg | Freq: Once | INTRAMUSCULAR | Status: AC
  Administered 2020-02-23: 06:00:00 4 mg via INTRAVENOUS
  Filled 2020-02-23: qty 2

## 2020-02-23 MED ORDER — SODIUM CHLORIDE 0.9 % IV SOLN
1.0000 g | Freq: Three times a day (TID) | INTRAVENOUS | Status: DC
  Administered 2020-02-23 – 2020-02-26 (×9): 1 g via INTRAVENOUS
  Filled 2020-02-23 (×12): qty 1

## 2020-02-23 MED ORDER — HYDRALAZINE HCL 20 MG/ML IJ SOLN: 10.0000 mg | INTRAMUSCULAR | Status: DC | PRN

## 2020-02-23 MED ORDER — ENOXAPARIN SODIUM 40 MG/0.4ML ~~LOC~~ SOLN
40.0000 mg | SUBCUTANEOUS | Status: DC
  Administered 2020-02-23 – 2020-02-28 (×6): 40 mg via SUBCUTANEOUS
  Filled 2020-02-23 (×6): qty 0.4

## 2020-02-23 MED ORDER — INSULIN ASPART 100 UNIT/ML ~~LOC~~ SOLN: 0.0000 [IU] | Freq: Three times a day (TID) | SUBCUTANEOUS | Status: DC

## 2020-02-23 MED ORDER — ONDANSETRON HCL 4 MG/2ML IJ SOLN: 4.0000 mg | Freq: Three times a day (TID) | INTRAMUSCULAR | Status: DC | PRN

## 2020-02-23 MED ORDER — HYDROMORPHONE HCL 1 MG/ML IJ SOLN: 0.5000 mg | INTRAMUSCULAR | Status: DC | PRN

## 2020-02-23 MED ORDER — MORPHINE SULFATE (PF) 4 MG/ML IV SOLN
4.0000 mg | Freq: Once | INTRAVENOUS | Status: AC
  Administered 2020-02-23: 08:00:00 4 mg via INTRAVENOUS
  Filled 2020-02-23: qty 1

## 2020-02-23 MED ORDER — INSULIN REGULAR(HUMAN) IN NACL 100-0.9 UT/100ML-% IV SOLN
INTRAVENOUS | Status: DC
  Administered 2020-02-23: 16:00:00 4.4 [IU]/h via INTRAVENOUS
  Administered 2020-02-24: 3.8 [IU]/h via INTRAVENOUS
  Administered 2020-02-25: 2.4 [IU]/h via INTRAVENOUS
  Filled 2020-02-23 (×2): qty 100

## 2020-02-23 NOTE — ED Notes (Signed)
Hospitalist at bedside. Pt to IR.

## 2020-02-23 NOTE — Progress Notes (Signed)
Cross-coverage note:   Patient seen while holding in ED for progressive bed.   She was admitted with acute pancreatitis, likely secondary to hypertriglyceridemia, is being treated with insulin infusion, and had HD cath placed by IR for potential plasmapheresis.   She has spiked a fever to 39.5 C, has ongoing tachycardia. There was no evidence for infection on CXR or UA, no evidence for infected necrosis on CT abd/pelvis this morning.   Plan to obtain blood cultures, give additional IVF, start empiric abx with gram-negative coverage for now while following cultures and clinical course. Discussed with RN and patient.

## 2020-02-23 NOTE — H&P (Signed)
Patient was seen earlier this morning by my partner-Dr.Niu-at [facility]-please refer to his detailed H&P.  Patient was transferred ED to ED-so that arrangements for plasmapheresis could be made sooner.  In summary: 44 year old Hispanic female-with history of DM-2, HTN, HLD-presented to [facility] with severe upper abdominal pain-found to have acute pancreatitis due to hypertriglyceridemia.  CT abdomen with changes consistent with pancreatitis-but no necrosis.    On exam: Vitals with BMI 02/23/2020 02/23/2020 02/23/2020  Height 5\' 0"  - -  Weight - - -  BMI - - -  Systolic - 110 107  Diastolic - 73 78  Pulse - 117  Some encounter information is confidential and restricted. Go to Review Flowsheets activity to see all data.   Abdomen: Soft-very tender in the upper abdomen.  Labs: CBC Latest Ref Rng & Units 02/22/2020 10/30/2019 10/16/2019  WBC 4.0 - 10.5 K/uL 18.2(H) 14.7(H) 9.3  Hemoglobin 12.0 - 15.0 g/dL 11.9(L) 12.4 12.6  Hematocrit 36.0 - 46.0 % 35.9(L) 33.6(L) 38.0  Platelets 150 - 400 K/uL 244 243 218   CMP Latest Ref Rng & Units 02/23/2020 02/23/2020 10/30/2019  Glucose 70 - 99 mg/dL 12/30/2019) 308(M) 578(I)  BUN 6 - 20 mg/dL 7 7 7   Creatinine 0.44 - 1.00 mg/dL 696(E) 9.52(W)  Sodium 135 - 145 mmol/L 133(L) 124(L) 123(L)  Potassium 3.5 - 5.1 mmol/L 4.7 4.9 3.3(L)  Chloride 98 - 111 mmol/L 92(L) 94(L) 92(L)  CO2 22 - 32 mmol/L 19(L) 19(L) 19(L)  Calcium 8.9 - 10.3 mg/dL 4.13) 2.44(W) 1.0(U)  Total Protein 6.5 - 8.1 g/dL 6.0(L) 5.8(L) 5.6(L)  Total Bilirubin 0.3 - 1.2 mg/dL 7.2(Z) 3.6(U) 1.3(H)  Alkaline Phos 38 - 126 U/L 59 60 70  AST 15 - 41 U/L 43(H) UNABLE TO REPORT DUE TO HEMOLYSIS 33  ALT 0 - 44 U/L 25 UNABLE TO REPORT DUE TO HEMOLYSIS 32   Assessment and plan: 1.)  Acute pancreatitis due to hypertriglyceridemia: Continue n.p.o. status-continue IV insulin infusion-IV fluids, as needed narcotics and antiemetics.  Nephrology has already consulted-with plans for HD catheter  placement to initiate plasmapheresis starting tomorrow.  Watch closely-for signs of necrosis/instability.   Note-no history of EtOH use-we will obtain limited RUQ ultrasound to make sure no cholelithiasis-however clinically this appears to be pancreatitis secondary to hypertriglyceridemia.  2.)  DM-2: Hold all oral hypoglycemic agents-on IV insulin for pancreatitis related to triglyceridemia.  Has mild elevated anion gap acidosis-which I suspect is starvation ketoacidosis from vomiting/n.p.o. status.  3.)  HLD: Continue statin/fenofibrate.  Rest as documented by Dr. 4.4(I.  No charge note

## 2020-02-23 NOTE — Progress Notes (Signed)
Pharmacy Antibiotic Note  Sabrina Hanna is a 44 y.o. female admitted on 02/23/2020 with Intra-abdominal infection .  Pharmacy has been consulted for Meropenem dosing.  Height: 5' (152.4 cm) IBW/kg (Calculated) : 45.5  Temp (24hrs), Avg:99.6 F (37.6 C), Min:97.6 F (36.4 C), Max:103.1 F (39.5 C)  Recent Labs  Lab 02/22/20 2239 02/23/20 0000 02/23/20 0212 02/23/20 2019  WBC 18.2*  --   --  17.4*  CREATININE  --  0.47 0.42* <0.30*    CrCl cannot be calculated (This lab value cannot be used to calculate CrCl because it is not a number: <0.30).    Allergies  Allergen Reactions  . Latex Rash  . Penicillin G Rash  . Penicillins Rash and Other (See Comments)    Has patient had a PCN reaction causing immediate rash, facial/tongue/throat swelling, SOB or lightheadedness with hypotension: No Has patient had a PCN reaction causing severe rash involving mucus membranes or skin necrosis: No Has patient had a PCN reaction that required hospitalization No Has patient had a PCN reaction occurring within the last 10 years: No If all of the above answers are "NO", then may proceed with Cephalosporin use.     Antimicrobials this admission: 12/30 Meropenem >>   Dose adjustments this admission: N/a  Microbiology results: Pending   Plan:  - Meropenem 1g IV q8h  - Monitor patients renal function and de-escalate as able    Thank you for allowing pharmacy to be a part of this patient's care.  Joaquim Lai PharmD. BCPS 02/23/2020 10:09 PM

## 2020-02-23 NOTE — H&P (Signed)
History and Physical    Sabrina Hanna VFI:[id number] DOB: 10/23/1975 DOA: 02/23/2020  Referring MD/NP/PA:   PCP: Leotis Shames, MD   Patient coming from:  The patient is coming from home.  At baseline, pt is independent for most of ADL.        Chief Complaint: Abdominal pain  HPI: Sabrina Hanna is a 44 y.o. female with medical history significant of hypertension, diabetes mellitus, GERD, pancreatitis, hypertriglyceridemia, Metformin overdose, COVID-19 infection 8/70/21, who presents with abdominal pain.  Patient states that her symptoms started yesterday morning, including abdominal pain, nausea and vomiting.  No diarrhea. Her abdominal pain is diffuse, constant, sharp, severe, radiating up to the back and lower chest. The pain is associated with nausea and nonbloody nonbilious emesis. She reports that the pain is similar to prior episodes of pancreatitis.Patient has some mild shortness of breath, no cough, fever or chills.  No symptoms of UTI or unilateral weakness chills  ED Course: pt was found to have lipase 50, triglyceride level >5000, troponin level 3, DKA (blood sugar 254, bicarbonate 19, anion gap 22, urinalysis positive for ketone 80), negative urinalysis, pending Covid PCR, renal function okay, WBC 18.2, abnormal liver function (ALP 59, AST 43, ALT 25, total bilirubin 3.4), pending lipid panel,  temperature 99.1, blood pressure 130/78, heart rate 89, RR 20, oxygen saturation 98% on room air.  Chest x-ray negative.  CT of abdomen/pelvis showed acute pancreatitis.  Patient is admitted to MedSurg bed as inpatient.  CT-abd/pelvis: 1. Acute Pancreatitis with confluent left upper quadrant inflammation secondarily involving the stomach, the splenic flexure of colon, and possibly also the left renal upper pole. No pancreatic necrosis, drainable fluid collection, or other complicating feature.  2. Distended urinary bladder (600 mL).  3. Chronic fatty liver  disease.  Review of Systems:   General: no fevers, chills, no body weight gain, has poor appetite, has fatigue HEENT: no blurry vision, hearing changes or sore throat Respiratory: has mild dyspnea, no coughing, wheezing CV: has chest pain radiated from abdomen, no palpitations GI: has nausea, vomiting, abdominal pain, no diarrhea, constipation GU: no dysuria, burning on urination, increased urinary frequency, hematuria  Ext: no leg edema Neuro: no unilateral weakness, numbness, or tingling, no vision change or hearing loss Skin: no rash, no skin tear. MSK: No muscle spasm, no deformity, no limitation of range of movement in spin Heme: No easy bruising.  Travel history: No recent long distant travel.  Allergy:  Allergies  Allergen Reactions  . Latex Rash  . Penicillin G Rash  . Penicillins Rash and Other (See Comments)    Has patient had a PCN reaction causing immediate rash, facial/tongue/throat swelling, SOB or lightheadedness with hypotension: No Has patient had a PCN reaction causing severe rash involving mucus membranes or skin necrosis: No Has patient had a PCN reaction that required hospitalization No Has patient had a PCN reaction occurring within the last 10 years: No If all of the above answers are "NO", then may proceed with Cephalosporin use.     Past Medical History:  Diagnosis Date  . Diabetes mellitus without complication (HCC)   . Hypertriglyceridemia   . Pancreatitis     Past Surgical History:  Procedure Laterality Date  . ABDOMINAL HYSTERECTOMY    . LAPAROSCOPIC UNILATERAL SALPINGO OOPHERECTOMY Right     Social History:  reports that she has never smoked. She has never used smokeless tobacco. She reports that she does not drink alcohol and does not use drugs.  Family History:  Family History  Problem Relation Age of Onset  . Diabetes Mellitus II Maternal Grandmother   . Diabetes Mellitus II Sister      Prior to Admission medications   Medication  Sig Start Date End Date Taking? Authorizing Provider  albuterol (VENTOLIN HFA) 108 (90 Base) MCG/ACT inhaler Inhale 2 puffs into the lungs 4 (four) times daily as needed for wheezing or shortness of breath. 10/08/19   [provider]  atorvastatin (LIPITOR) 20 MG tablet Take 20 mg by mouth at bedtime. 06/26/17   [provider]  benzonatate (TESSALON) 200 MG capsule Take 200 mg by mouth 3 (three) times daily as needed. 10/08/19   [provider]  esomeprazole (NEXIUM) 20 MG capsule Take 20 mg by mouth daily. 08/24/19   [provider]  famotidine (PEPCID) 20 MG tablet Take 20 mg by mouth 2 (two) times daily. 07/04/19   [provider]  fenofibrate (TRICOR) 145 MG tablet Take 145 mg by mouth daily. 06/26/17   [provider]  gabapentin (NEURONTIN) 100 MG capsule Take 100 mg by mouth 3 (three) times daily. 08/24/19   [provider]  insulin aspart (NOVOLOG FLEXPEN) 100 UNIT/ML FlexPen Inject 5-15 Units into the skin 3 (three) times daily with meals. Uses sliding scale 10/16/19   Meredeth Ide, MD  LANTUS 100 UNIT/ML injection Inject 0.16 mLs (16 Units total) into the skin at bedtime. 10/16/19   Meredeth Ide, MD  metFORMIN (GLUCOPHAGE) 500 MG tablet Take 1,000 mg by mouth 2 (two) times daily. 07/24/19   [provider]    Physical Exam: Vitals:   02/23/20 0159 02/23/20 0533 02/23/20 1329 02/23/20 1357  BP: 121/75 130/78  120/86  Pulse: 80 89  90  Resp: Temp: 97.6 F (36.4 C)   97.8 F (36.6 C)  TempSrc: Oral     SpO2: 99% 98%  98%  Weight:   64.4 kg   Height:    (1.448 m)    General: in acute distress due to severe abdominal pain HEENT:       Eyes: PERRL, EOMI, no scleral icterus.       ENT: No discharge from the ears and nose, no pharynx injection, no tonsillar enlargement.        Neck: No JVD, no bruit, no mass felt. Heme: No neck lymph node enlargement. Cardiac: S1/S2, RRR, No murmurs, No gallops or  rubs. Respiratory:  No rales, wheezing, rhonchi or rubs. GI: Soft, nondistended, has diffused tenderness, no rebound pain, no organomegaly, BS present. GU: No hematuria Ext: No pitting leg edema bilaterally. 2+DP/PT pulse bilaterally. Musculoskeletal: No joint deformities, No joint redness or warmth, no limitation of ROM in spin. Skin: No rashes.  Neuro: Alert, oriented X3, cranial nerves II-XII grossly intact, moves all extremities normally. Psych: Patient is not psychotic, no suicidal or hemocidal ideation.  Labs on Admission: I have personally reviewed following labs and imaging studies  CBC: Recent Labs  Lab 02/22/20 2239  WBC 18.2*  HGB 11.9*  HCT 35.9*  MCV 86.9  PLT 244   Basic Metabolic Panel: Recent Labs  Lab 02/23/20 0000 02/23/20 0212  NA 124* 133*  K 4.9 4.7  CL 94* 92*  CO2 19* 19*  GLUCOSE 257* 254*  BUN 7 7  CREATININE 0.47 0.42*  CALCIUM 8.6* 8.7*   GFR: Estimated Creatinine Clearance: 70.7 mL/min (A) (by C-G formula based on SCr of 0.42 mg/dL (L)). Liver Function Tests: Recent Labs  Lab 02/23/20 0000 02/23/20 0212  AST UNABLE TO REPORT DUE TO HEMOLYSIS 43*  ALT UNABLE TO REPORT DUE TO HEMOLYSIS 25  ALKPHOS 60 59  BILITOT 3.4* 3.4*  PROT 5.8* 6.0*  ALBUMIN 4.0 3.9   Recent Labs  Lab 02/23/20 0000  LIPASE 50   No results for input(s): AMMONIA in the last 168 hours. Coagulation Profile: No results for input(s): INR, PROTIME in the last 168 hours. Cardiac Enzymes: No results for input(s): CKTOTAL, CKMB, CKMBINDEX, TROPONINI in the last 168 hours. BNP (last 3 results) No results for input(s): PROBNP in the last 8760 hours. HbA1C: No results for input(s): HGBA1C in the last 72 hours. CBG: Recent Labs  Lab 02/23/20 1353  GLUCAP 223*   Lipid Profile: Recent Labs    02/23/20 0212  CHOL 696*  HDL NOT REPORTED DUE TO HIGH TRIGLYCERIDES  LDLCALC UNABLE TO CALCULATE IF TRIGLYCERIDE OVER 400 mg/dL  TRIG >6,160*  CHOLHDL NOT REPORTED DUE  TO HIGH TRIGLYCERIDES   Thyroid Function Tests: No results for input(s): TSH, T4TOTAL, FREET4, T3FREE, THYROIDAB in the last 72 hours. Anemia Panel: No results for input(s): VITAMINB12, FOLATE, FERRITIN, TIBC, IRON, RETICCTPCT in the last 72 hours. Urine analysis:    Component Value Date/Time   COLORURINE YELLOW (A) 02/23/2020 0000   APPEARANCEUR HAZY (A) 02/23/2020 0000   LABSPEC 1.044 (H) 02/23/2020 0000   PHURINE 5.0 02/23/2020 0000   GLUCOSEU >=500 (A) 02/23/2020 0000   HGBUR SMALL (A) 02/23/2020 0000   BILIRUBINUR NEGATIVE 02/23/2020 0000   KETONESUR 80 (A) 02/23/2020 0000   PROTEINUR 100 (A) 02/23/2020 0000   NITRITE NEGATIVE 02/23/2020 0000   LEUKOCYTESUR NEGATIVE 02/23/2020 0000   Sepsis Labs: @LABRCNTIP (procalcitonin:4,lacticidven:4) )No results found for this or any previous visit (from the past 240 hour(s)).   Radiological Exams on Admission: DG Chest 2 View  Result Date: 02/22/2020 CLINICAL DATA:  Chest pain EXAM: CHEST - 2 VIEW COMPARISON:  10/11/2019 FINDINGS: The heart size and mediastinal contours are within normal limits. Both lungs are clear. The visualized skeletal structures are unremarkable. IMPRESSION: No active cardiopulmonary disease. Electronically Signed   By: 10/13/2019 M.D.   On: 02/22/2020 23:36   CT ABDOMEN PELVIS W CONTRAST  Result Date: 02/23/2020 CLINICAL DATA:  44 year old female with left side abdominal pain, chest and back pain. EXAM: CT ABDOMEN AND PELVIS WITH CONTRAST TECHNIQUE: Multidetector CT imaging of the abdomen and pelvis was performed using the standard protocol following bolus administration of intravenous contrast. CONTRAST:  45 OMNIPAQUE IOHEXOL 300 MG/ML  SOLN COMPARISON:  CT Abdomen and Pelvis 03/15/2019 and earlier. FINDINGS: Lower chest: Lower lung volumes. Atelectasis at the lung bases, and more pronounced opacity in the posterior basal segment of the left lower lobe, although the most affected parenchyma there is  enhancing. No pericardial or pleural effusion. Hepatobiliary: Chronic hepatic steatosis. Negative gallbladder. No dilated bile ducts. Pancreas: Moderate to severe peripancreatic inflammation surrounding the body and tail, tracking to the gastrosplenic ligament and to the splenic flexure (series 2, image 27). No organized or drainable fluid collection. No pancreatic necrosis identified. No ductal dilatation. Spleen: Negative aside from regional inflammation. Adrenals/Urinary Tract: Adrenal glands remain normal. Bilateral renal contrast excretion is symmetric and within normal limits. Inflammation mildly involves the left pararenal space anteriorly. No nephrolithiasis. There is subtle abnormal decreased enhancement of the left renal upper pole on coronal image 64. Otherwise renal enhancement is within normal limits. Ureters are decompressed. Urinary bladder is distended (estimated volume 600 mL), but otherwise unremarkable. Stomach/Bowel:  Decompressed large bowel from the splenic flexure distally. Secondary inflammation of the splenic flexure related to the lesser sac, peripancreatic edema. More normal appearance of the transverse colon. Negative right colon. Appendix within normal limits. Negative terminal ileum. No dilated small bowel. Secondarily inflamed stomach with circumferential wall thickening sparing the antrum. The duodenum and proximal jejunum appear relatively spared. No free air. Vascular/Lymphatic: Major arterial structures in the abdomen and pelvis are patent. Portal venous system including the splenic vein appears to be patent. No lymphadenopathy. Reproductive: Surgically absent uterus. Ovaries are stable, within normal limits. Other: No pelvic free fluid. Musculoskeletal: Negative. IMPRESSION: 1. Acute Pancreatitis with confluent left upper quadrant inflammation secondarily involving the stomach, the splenic flexure of colon, and possibly also the left renal upper pole. No pancreatic necrosis,  drainable fluid collection, or other complicating feature. 2. Distended urinary bladder (600 mL). 3. Chronic fatty liver disease. Electronically Signed   By: Odessa Fleming M.D.   On: 02/23/2020 08:20     EKG: I have personally reviewed.  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 Pt will need plasmapheresis. Pt needs to be transferred to [facility] for plasmapheresis, but no bed available currenlty. EDP in [facility] talked to [facility] ED colleague, who agreed to transfer patient ED to ED.  Both EDP here and I consulted Dr. Ronalee Belts of nephrology who agreed to consult when pt arrives to [facility] ED. Pt will need IR consult for dialysis catheter placement at arrival to [facility] ED. I also spoke with Dr. Katrinka Blazing of [facility] about this plan by phone.  -pt was initially admitted to med-surg bed here in [facility] -->will cancel this admission since pt will be transferred to [facility] ED.  -will start Insulin gtt (consulted pharmacist for using hypertriglyceridemia protocol and for monitoring electrolytes) -need to check TG level q12h -check CBG q1h and BMP q4h -IVF: 1L ns then then D5 at 100 cc/h -As needed Dilaudid, oxycodone for pain -As needed Zofran for nausea -NPO now  DKA (diabetic ketoacidosis) (HCC): blood sugar 254, bicarbonate 19, anion gap 22, urinalysis positive for ketone 80. Pending beta hydroxybutyric acid level  Diabetes mellitus without complication (HCC): Recent A1c 8.7 on 06/27/2017, poorly controlled.  Patient is taking Metformin, NovoLog and Lantus at home -IV insulin drip as above currently  HLD  (hyperlipidemia) -Lipitor and TriCor  GERD (gastroesophageal reflux disease) -protonix  Abnormal LFTs -will need US-RUQ at arrival to [facility] ED  Leukocytosis: WBC 18.2, no fever.  No source of infection identified, likely reactive. -Follow-up of CBC     DVT ppx: SQ Lovenox Code Status: Full code Family Communication: not done, no family member is at bed side. Disposition Plan:  Anticipate discharge back to previous environment Consults called: Dr. Ronalee Belts Admission status: need to be in SDU Status is: Inpatient  Remains inpatient appropriate because:Inpatient level of care appropriate due to severity of illness   Dispo: The patient is from: Home              Anticipated d/c is to: Home              Anticipated d/c date is: 2 days  Patient currently is not medically stable to d/c.          Date of Service 02/23/2020    Lorretta HarpXilin [place] [facility]   If 7PM-7AM, please contact night-coverage [email or web address] 02/23/2020, 2:33 PM

## 2020-02-23 NOTE — Procedures (Signed)
Interventional Radiology Procedure Note  Procedure: Right IJ triple lumen non-tunneled HD catheter  Complications: None  Estimated Blood Loss: None  Recommendations: - Routine line care    Signed,  Sterling Big, MD

## 2020-02-23 NOTE — ED Notes (Signed)
Redraw BMET as requested by lab for hemolyzed specimen recollected and sent.

## 2020-02-23 NOTE — Consult Note (Signed)
Patient Status: [name] - ED  Assessment and Plan: History of pancreatitis and hypertriglyceridemia in need of venous access for plasma exchange.  Plan for image-guided non-tunneled HD catheter placement today in IR. Afebrile.  Tele-interpretor [phone] was used throughout today's interaction.  Risks and benefits discussed with the patient including, but not limited to bleeding, infection, vascular injury, pneumothorax which may require chest tube placement, air embolism or even death. All of the patient's questions were answered, patient is agreeable to proceed. Consent signed and in IR control room.   ______________________________________________________________________   History of Present Illness: Sabrina Hanna is a 44 y.o. female with a past medical history of hypertension, GERD, pancreatitis, COVID-19 infection 09/2019, and diabetes mellitus. She presented to [facility] ED 02/22/2020 with complaints of abdominal pain. In ED, triglycerides elevated and CT abdomen/pelvis revealed acute pancreatitis. She was transferred and admitted to [facility] for further management. Nephrology was consulted who recommended plasmapheresis along with IR consult for possible non-tunneled HD catheter placement.  IR consulted by Dr. Ronalee Belts for possible image-guided non-tunneled HD catheter placement. Patient awake and alert laying in bed. Complains of abdominal pain, rated 7/10 at this time. Denies N/V associated with abdominal pain. Denies fever, chills, chest pain, or dyspnea.   Allergies and medications reviewed.   Review of Systems: A 12 point ROS discussed and pertinent positives are indicated in the HPI above.  All other systems are negative.  Review of Systems  Constitutional: Negative for chills and fever.  Respiratory: Negative for shortness of breath and wheezing.   Cardiovascular: Negative for chest pain and palpitations.  Gastrointestinal: Positive for abdominal pain. Negative for nausea  and vomiting.    Vital Signs: BP 119/75   Pulse (!) 121   Temp 100.1 F (37.8 C) (Oral)   Resp (!) 22   SpO2 96%   Physical Exam Vitals and nursing note reviewed.  Constitutional:      General: She is not in acute distress.    Appearance: She is well-developed.  Cardiovascular:     Rate and Rhythm: Regular rhythm. Tachycardia present.     Heart sounds: Normal heart sounds. No murmur heard.   Pulmonary:     Effort: Pulmonary effort is normal. No respiratory distress.     Breath sounds: Normal breath sounds. No wheezing.  Abdominal:     Tenderness: There is abdominal tenderness.  Skin:    General: Skin is warm and dry.  Neurological:     Mental Status: She is alert and oriented to person, place, and time.      Imaging reviewed.   Labs:  COAGS: No results for input(s): INR, APTT in the last 8760 hours.  BMP: Recent Labs    10/14/19 0513 10/15/19 0615 10/16/19 0717 10/30/19 2015 02/23/20 0000 02/23/20 0212  NA 137 137 136 123* 124* 133*  K 3.1* 3.9 3.7 3.3* 4.9 4.7  CL 102 102 98 92* 94* 92*  CO2 25 23 26  19* 19* 19*  GLUCOSE 203* 287* 251* 275* 257* 254*  BUN 13 12 13 7 7 7   CALCIUM 8.6* 8.6* 9.1 8.5* 8.6* 8.7*  CREATININE 0.33* 0.49 0.49 0.32* 0.47 0.42*  GFRNONAA >60 >60 >60 >60 >60 >60  GFRAA >60 >60 >60 >60  --   --        Electronically Signed: , PA-C 02/23/2020, 4:06 PM   I spent a total of 15 minutes in face to face in clinical consultation, greater than 50% of which was counseling/coordinating care for venous access.

## 2020-02-23 NOTE — ED Notes (Signed)
This RN updated pt's daughter via pt's personal phone

## 2020-02-23 NOTE — Consult Note (Signed)
[facility] Nephrology Consultation Note  Requesting MD: [facility] ER and Dr Lorretta Harp Reason for consult: need for plasma exchange  HPI:  Sabrina Hanna is a 44 y.o. female with history of hypertension, DM, acid reflux, pancreatitis, hypertriglyceridemia, COVID-19 infection was initially presented to [facility] ER with abdominal pain found to have very high TG level associated with pancreatitis, transferred to [facility] hospital seen as a consultation to provide plasma exchange. Patient with history of recurrent hypertriglyceridemia.  The last one was in 10/2019 required hospitalization to [facility].  The TG level was around 1100 which was managed medically.  She also had Covid infection. She now presents with nausea vomiting and severe abdominal pain.  In the ER she was hemodynamically stable however later had low-grade temperature.  The initial labs showed sodium 124, creatinine 0.4, lipase 50, triglyceride level more than 5000.  Treated with IV fluid and insulin.  The repeat labs showed a sodium 133, potassium 4.7. The CT scan showed acute pancreatitis with confluent at left upper quadrant and distended urinary bladder. The patient was transferred from [facility] ER to [facility] for plasma exchange.  Patient is admitted under hospitalist service. At home she is on Lipitor, Nexium, TriCor, Neurontin, insulin, Metformin. Currently she is on IV fluid and insulin.  Reports abdominal pain and some nausea. Denies chest pain, shortness of breath.  No urinary complaint.  No lower extremity edema.  Creatinine, Ser  Date/Time Value Ref Range Status  02/23/2020 02:12 AM 0.42 (L) 0.44 - 1.00 mg/dL Final  78/29/5621 30:86 AM 0.47 0.44 - 1.00 mg/dL Final  57/84/6962 95:28 PM 0.32 (L) 0.44 - 1.00 mg/dL Final  41/32/4401 02:72 AM 0.49 0.44 - 1.00 mg/dL Final  53/66/4403 47:42 AM 0.49 0.44 - 1.00 mg/dL Final   PMHx:   Past Medical History:  Diagnosis Date  . Diabetes mellitus without complication (HCC)   .  Hypertriglyceridemia   . Pancreatitis     Past Surgical History:  Procedure Laterality Date  . ABDOMINAL HYSTERECTOMY    . LAPAROSCOPIC UNILATERAL SALPINGO OOPHERECTOMY Right     Family Hx:  Family History  Problem Relation Age of Onset  . Diabetes Mellitus II Maternal Grandmother   . Diabetes Mellitus II Sister     Social History:  reports that she has never smoked. She has never used smokeless tobacco. She reports that she does not drink alcohol and does not use drugs.  Allergies:  Allergies  Allergen Reactions  . Latex Rash  . Penicillin G Rash  . Penicillins Rash and Other (See Comments)    Has patient had a PCN reaction causing immediate rash, facial/tongue/throat swelling, SOB or lightheadedness with hypotension: No Has patient had a PCN reaction causing severe rash involving mucus membranes or skin necrosis: No Has patient had a PCN reaction that required hospitalization No Has patient had a PCN reaction occurring within the last 10 years: No If all of the above answers are "NO", then may proceed with Cephalosporin use.     Medications: Prior to Admission medications   Medication Sig Start Date End Date Taking? Authorizing Provider  albuterol (VENTOLIN HFA) 108 (90 Base) MCG/ACT inhaler Inhale 2 puffs into the lungs 4 (four) times daily as needed for wheezing or shortness of breath. 10/08/19   [provider]  atorvastatin (LIPITOR) 20 MG tablet Take 20 mg by mouth at bedtime. 06/26/17   [provider]  benzonatate (TESSALON) 200 MG capsule Take 200 mg by mouth 3 (three) times daily as needed. 10/08/19  [provider]  esomeprazole (NEXIUM) 20 MG capsule Take 20 mg by mouth daily. 08/24/19   [provider]  famotidine (PEPCID) 20 MG tablet Take 20 mg by mouth 2 (two) times daily. 07/04/19   [provider]  fenofibrate (TRICOR) 145 MG tablet Take 145 mg by mouth daily. 06/26/17   [provider]  gabapentin (NEURONTIN)  100 MG capsule Take 100 mg by mouth 3 (three) times daily. 08/24/19   [provider]  insulin aspart (NOVOLOG FLEXPEN) 100 UNIT/ML FlexPen Inject 5-15 Units into the skin 3 (three) times daily with meals. Uses sliding scale 10/16/19   Meredeth Ide, MD  LANTUS 100 UNIT/ML injection Inject 0.16 mLs (16 Units total) into the skin at bedtime. 10/16/19   Meredeth Ide, MD  metFORMIN (GLUCOPHAGE) 500 MG tablet Take 1,000 mg by mouth 2 (two) times daily. 07/24/19   [provider]    I have reviewed the patient's current medications.  Labs:  Results for orders placed or performed during the hospital encounter of 02/23/20 (from the past 48 hour(s))  CBG monitoring, ED     Status: Abnormal   Collection Time: 02/23/20  2:56 PM  Result Value Ref Range   Glucose-Capillary 214 (H) 70 - 99 mg/dL    Comment: Glucose reference range applies only to samples taken after fasting for at least 8 hours.     ROS:  Pertinent items noted in HPI and remainder of comprehensive ROS otherwise negative.  Physical Exam: Vitals:   02/23/20 1517  BP: 112/75  Pulse: (!) 115  Resp: (!) 21  Temp: 100.1 F (37.8 C)  SpO2: 93%     General exam: Appears calm and comfortable  Respiratory system: Clear to auscultation. Respiratory effort normal. No wheezing or crackle Cardiovascular system: S1 & S2 heard, RRR.  No pedal edema. Gastrointestinal system: Abdomen is nondistended, soft has epigastric tenderness. Central nervous system: Alert and oriented. No focal neurological deficits. Extremities: Symmetric 5 x 5 power. Skin: No rashes, lesions or ulcers Psychiatry: Judgement and insight appear normal. Mood & affect appropriate.   Assessment/Plan:  #Hypertriglyceridemia associated with acute pancreatitis: TG more than 5000.  She has history of recurrent hypertriglyceridemia. Transferred from [facility] to [facility] in order to get plasma exchange.  I consulted IR for the placement of temporary HD catheter.   Discussed personally to Dr. Archer Asa. Plan for plasma exchange with IV albumin when catheter is in, probably start in the morning.  Ordered PE daily for 3 days but it will need daily assessment and TG level.  Discussed with the dialysis nurse as well. Continue IV fluid, insulin per primary team.  #Hyponatremia: Improved with insulin and fluid.  Continue to monitor.  #Nausea vomiting associated with pancreatitis: Antiemetics and supportive treatment per primary team.  Thank you for the consult, we will follow with you.  Discussed with primary team Dr. Katrinka Blazing and ER physician Dr. Gillermina Hu 02/23/2020, 3:33 PM  [facility].

## 2020-02-23 NOTE — Plan of Care (Signed)
Called regarding transfer to [facility] for need of plasmapheresis due to pancreatitis thought secondary to hypertriglyceridemia(>5000).  Currently holding 30 beds in the emergency department here at [facility].  Nephrology Dr. Ronalee Belts have been contacted regarding patient and would see in consultation.  Patient currently on insulin drip.  Current plan is to try and transfer the patient ED to ED.

## 2020-02-23 NOTE — ED Notes (Signed)
This RN contacted lab to ensure the repeat CMP was added on to and ran from sample collected at 0212 for repeat troponin. Lab reports they see and will complete ordered CMP at this time

## 2020-02-23 NOTE — ED Notes (Signed)
Pt returned from IR.

## 2020-02-23 NOTE — ED Provider Notes (Signed)
[facility] Emergency Department Provider Note  ____________________________________________  Time seen: Approximately 5:27 AM  I have reviewed the triage vital signs and the nursing notes.   HISTORY  Chief Complaint Abdominal Pain   HPI Sabrina Hanna is a 44 y.o. female with a history of hypertriglyceridemia, pancreatitis, diabetes who presents for evaluation of abdominal pain.  Patient reports that her symptoms started this morning.  She is complaining of severe sharp constant pain diffuse in her abdomen radiating up to the left side of her chest and her back.  The pain is associated with nausea and nonbloody nonbilious emesis.  No diarrhea, no constipation, no fever or chills, no cough or shortness of breath.  She reports that the pain is identical to prior episodes of pancreatitis.   Past Medical History:  Diagnosis Date  . Diabetes mellitus without complication (HCC)   . Hypertriglyceridemia   . Pancreatitis     Patient Active Problem List   Diagnosis Date Noted  . Pneumonia due to COVID-19 virus 10/12/2019  . Pancreatitis 06/28/2017  . 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 Intentional metformin overdose (HCC) 04/08/2016  . Diabetes mellitus without complication (HCC) 04/08/2016    Past Surgical History:  Procedure Laterality Date  . ABDOMINAL HYSTERECTOMY    . LAPAROSCOPIC UNILATERAL SALPINGO OOPHERECTOMY Right     Prior to Admission medications   Medication Sig Start Date End Date Taking? Authorizing Provider  albuterol (VENTOLIN HFA) 108 (90 Base) MCG/ACT inhaler Inhale 2 puffs into the lungs 4 (four) times daily as needed for wheezing or shortness of breath. 10/08/19   [provider]  atorvastatin (LIPITOR) 20 MG tablet Take 20 mg by mouth at bedtime. 06/26/17   [provider]  benzonatate (TESSALON) 200 MG capsule Take 200 mg by mouth 3 (three) times daily as needed.  10/08/19   [provider]  esomeprazole (NEXIUM) 20 MG capsule Take 20 mg by mouth daily. 08/24/19   [provider]  famotidine (PEPCID) 20 MG tablet Take 20 mg by mouth 2 (two) times daily. 07/04/19   [provider]  fenofibrate (TRICOR) 145 MG tablet Take 145 mg by mouth daily. 06/26/17   [provider]  gabapentin (NEURONTIN) 100 MG capsule Take 100 mg by mouth 3 (three) times daily. 08/24/19   [provider]  insulin aspart (NOVOLOG FLEXPEN) 100 UNIT/ML FlexPen Inject 5-15 Units into the skin 3 (three) times daily with meals. Uses sliding scale 10/16/19   Meredeth Ide, MD  LANTUS 100 UNIT/ML injection Inject 0.16 mLs (16 Units total) into the skin at bedtime. 10/16/19   Meredeth Ide, MD  metFORMIN (GLUCOPHAGE) 500 MG tablet Take 1,000 mg by mouth 2 (two) times daily. 07/24/19   [provider]    Allergies Latex, Penicillin g, and Penicillins  Family History  Problem Relation Age of Onset  . Diabetes Mellitus II Maternal Grandmother   . Diabetes Mellitus II Sister     Social History Social History   Tobacco Use  . Smoking status: Never Smoker  . Smokeless tobacco: Never Used  Vaping Use  . Vaping Use: Never used  Substance Use Topics  . Alcohol use: No  . Drug use: No    Review of Systems  Constitutional: Negative for fever. Eyes: Negative for visual changes. ENT: Negative for sore throat. Neck: No neck pain  Cardiovascular: Negative for chest pain. Respiratory: Negative for shortness of breath. Gastrointestinal: + abdominal pain, nausea.  and vomiting. No diarrhea. Genitourinary: Negative for dysuria. Musculoskeletal: Negative for back pain. Skin: Negative for rash. Neurological: Negative for headaches, weakness or numbness. Psych: No SI or HI  ____________________________________________   PHYSICAL EXAM:  VITAL SIGNS: ED Triage Vitals  Enc Vitals Group     BP 02/22/20 2237 125/69     Pulse Rate 02/22/20  2237 83     Resp 02/22/20 2237 20     Temp 02/22/20 2237 99 F (37.2 C)     Temp Source 02/22/20 2237 Oral     SpO2 02/22/20 2237 99 %     Weight --      Height --      Head Circumference --      Peak Flow --      Pain Score 02/22/20 2234 10     Pain Loc --      Pain Edu? --      Excl. in GC? --     Constitutional: Alert and oriented, patient looks very uncomfortable due to pain. HEENT:      Head: Normocephalic and atraumatic.         Eyes: Conjunctivae are normal. Sclera is non-icteric.       Mouth/Throat: Mucous membranes are moist.       Neck: Supple with no signs of meningismus. Cardiovascular: Regular rate and rhythm. No murmurs, gallops, or rubs. 2+ symmetrical distal pulses are present in all extremities. Respiratory: Normal respiratory effort. Lungs are clear to auscultation bilaterally.  Gastrointestinal: Soft, nondistended, diffusely tender to palpation with localized guarding in the epigastric region. Musculoskeletal: No edema, cyanosis, or erythema of extremities. Neurologic: Normal speech and language. Face is symmetric. Moving all extremities. No gross focal neurologic deficits are appreciated. Skin: Skin is warm, dry and intact. No rash noted. Psychiatric: Mood and affect are normal. Speech and behavior are normal.  ____________________________________________   LABS (all labs ordered are listed, but only abnormal results are displayed)  Labs Reviewed  CBC - Abnormal; Notable for the following components:      Result Value   WBC 18.2 (*)    Hemoglobin 11.9 (*)    HCT 35.9 (*)    All other components within normal limits  URINALYSIS, COMPLETE (UACMP) WITH MICROSCOPIC - Abnormal; Notable for the following components:   Color, Urine YELLOW (*)    APPearance HAZY (*)    Specific Gravity, Urine 1.044 (*)    Glucose, UA >=500 (*)    Hgb urine dipstick SMALL (*)    Ketones, ur 80 (*)    Protein, ur 100 (*)    All other components within normal limits   COMPREHENSIVE METABOLIC PANEL - Abnormal; Notable for the following components:   Sodium 124 (*)    Chloride 94 (*)    CO2 19 (*)    Glucose, Bld 257 (*)    Calcium 8.6 (*)    Total Protein 5.8 (*)    Total Bilirubin 3.4 (*)    All other components within normal limits  TROPONIN I (HIGH SENSITIVITY) - Abnormal; Notable for the following components:   Troponin I (High Sensitivity) 33 (*)    All other components within normal limits  LIPASE, BLOOD  COMPREHENSIVE METABOLIC PANEL  TROPONIN I (HIGH SENSITIVITY)   ____________________________________________  EKG  ED ECG REPORT I, Nita Sickle, the attending physician, personally viewed and interpreted this ECG.  Sinus rhythm with first-degree AV block, rate of 83, normal QTC, right axis deviation, no ST elevations or depressions ____________________________________________  RADIOLOGY  I have personally reviewed the images performed during this visit and I agree with the Radiologist's read.   Interpretation by Radiologist:  DG Chest 2 View  Result Date: 02/22/2020 CLINICAL DATA:  Chest pain EXAM: CHEST - 2 VIEW COMPARISON:  10/11/2019 FINDINGS: The heart size and mediastinal contours are within normal limits. Both lungs are clear. The visualized skeletal structures are unremarkable. IMPRESSION: No active cardiopulmonary disease. Electronically Signed   By: Jasmine Pang M.D.   On: 02/22/2020 23:36     ____________________________________________   PROCEDURES  Procedure(s) performed:yes .1-3 Lead EKG Interpretation Performed by: Nita Sickle, MD Authorized by: Nita Sickle, MD     Interpretation: non-specific     ECG rate assessment: normal     Rhythm: sinus rhythm     Ectopy: none     Critical Care performed:  None ____________________________________________   INITIAL IMPRESSION / ASSESSMENT AND PLAN / ED COURSE   44 y.o. female with a history of hypertriglyceridemia, pancreatitis, diabetes  who presents for evaluation of abdominal pain, nausea, and vomiting since earlier today.  Patient looks very uncomfortable due to pain, she is afebrile with normal vital signs, abdomen is nondistended with diffuse tenderness and localized guarding in the epigastrium.  Differential diagnosis including pancreatitis versus gallbladder pathology versus peptic ulcer disease versus SBO versus diverticulitis versus volvulus versus dissection.  We will treat symptoms with IV morphine and IV Zofran.  We will get a CT of her abdomen.  Labs showing ketonuria and glycosuria with a CBG of 257, sodium of 124, bicarb of 19, normal anion gap.  Unable to get LFTs due to lipemic interference.  Lipase is normal.  White count is elevated at 18.2.  Chest x-ray visualized by me within normal limits, confirmed by radiology.  Old medical records reviewed including prior CT scan imaging and admissions for pancreatitis.  _________________________ 7:10 AM on 02/23/2020 ----------------------------------------- CT pending. Care transferred to Dr. Lenard Lance     _____________________________________________ Please note:  Patient was evaluated in Emergency Department today for the symptoms described in the history of present illness. Patient was evaluated in the context of the global COVID-19 pandemic, which necessitated consideration that the patient might be at risk for infection with the SARS-CoV-2 virus that causes COVID-19. Institutional protocols and algorithms that pertain to the evaluation of patients at risk for COVID-19 are in a state of rapid change based on information released by regulatory bodies including the CDC and federal and state organizations. These policies and algorithms were followed during the patient's care in the ED.  Some ED evaluations and interventions may be delayed as a result of limited staffing during the pandemic.   [place] Controlled Substance Database was reviewed by  me. ____________________________________________   FINAL CLINICAL IMPRESSION(S) / ED DIAGNOSES   Abdominal pain   NEW MEDICATIONS STARTED DURING THIS VISIT:  ED Discharge Orders    None       Note:  This document was prepared using Dragon voice recognition software and may include unintentional dictation errors.    Nita Sickle, MD 02/23/20 [phone]

## 2020-02-23 NOTE — ED Provider Notes (Addendum)
-----------------------------------------   8:57 AM on 02/23/2020 -----------------------------------------  Patient CT has resulted showing significant pancreatitis.  Patient continues to be in significant pain despite pain medication.  Repeat chemistry shows improvement of sodium however show several abnormalities possibly indicating hemolysis.  We will continue IV hydration.  Given the significant pancreatitis on CT imaging along with pain despite IV pain medication patient will be admitted to the hospital service for further work-up and treatment.   Minna Antis, MD 02/23/20 [phone]  Patient's triglycerides have resulted greater than 5000.  I spoke to our hospitalist here who would like to consult the hospitalist at [facility] to see if the patient should be transferred for plasmapheresis.  I spoke to Dr. Katrinka Blazing at [facility] he states there were no beds unfortunately at the hospital and he is not sure the patient would require plasmapheresis and asked me to speak to nephrology.  I spoke to Dr. Ronalee Belts of nephrology [facility] he states he does not manage hypertriglyceridemia but if the patient came to [facility] they would see the patient to see if the hospitalist wanted plasmapheresis, but reiterated there are no beds available at this time.  As there are no beds available at this time we will continue the insulin infusion and monitor the patient's triglycerides.  I reviewed the patient's care everywhere chart she was admitted recently at [facility] for elevated triglycerides and pancreatitis which was managed with insulin infusion which were able to bring down the triglycerides which were greater than 1100 which was the cut off.  I spoke to Dr. Clyde Lundborg of the hospitalist group here who is willing to admit the patient locally on insulin infusion and monitor the lipid panel, if patient is not improving we will discuss once again possible transfer for plasmapheresis.    Minna Antis, MD 02/23/20  1256  Hospitalist here spoke to the hospitalist at [facility] they believe the patient would best be treated by transferring from ED to ED so the patient could receive plasmapheresis.  I spoke to the ED physician who has accepted the patient ED to ED.  We will transfer shortly.    Minna Antis, MD 02/23/20 1334

## 2020-02-23 NOTE — ED Provider Notes (Signed)
Patient being transferred from [facility] today after being found to have pancreatitis and elevated triglycerides.  Patient is currently on IV therapy with insulin and fluids.  Blood sugar is stable.  However they discussed doing plasmapheresis which could not be done at [facility] and there are long wait times to receive a bed here.  [facility] has seen the patient and is ordering IR to place a catheter for plasmapheresis.  Will discuss with hospitalist that patient is hearing can continue admission.   Gwyneth Sprout, MD 02/23/20 1600

## 2020-02-23 NOTE — ED Notes (Signed)
214 CBG check on arrival did not cross over to computer. Pt continues to c/o of severe 7/10 pain.

## 2020-02-23 NOTE — ED Notes (Signed)
This RN attempted to give report but unsuccessful at reaching [facility] ED RN at [phone] and [phone]

## 2020-02-23 NOTE — ED Notes (Signed)
[facility] called in reference to potential transfer, hospitalist service, spoke with [place]

## 2020-02-23 NOTE — ED Triage Notes (Signed)
Pt arrived to [facility] this morning c/o abdominal pain. Had elevated lipase and started in insulin drip and a D5 drip. Last sugar 200 during transport   Give 1mg  Dilaudid in transport and 1mg  prior to leaving, rates pain 7/10 after mediations   Hx of pancreatitis

## 2020-02-24 DIAGNOSIS — A419 Sepsis, unspecified organism: Secondary | ICD-10-CM | POA: Diagnosis not present

## 2020-02-24 DIAGNOSIS — E781 Pure hyperglyceridemia: Secondary | ICD-10-CM

## 2020-02-24 DIAGNOSIS — K858 Other acute pancreatitis without necrosis or infection: Secondary | ICD-10-CM | POA: Diagnosis not present

## 2020-02-24 DIAGNOSIS — E876 Hypokalemia: Secondary | ICD-10-CM

## 2020-02-24 LAB — PHOSPHORUS: Phosphorus: 2.4 mg/dL — ABNORMAL LOW (ref 2.5–4.6)

## 2020-02-24 LAB — POCT I-STAT, CHEM 8
BUN: <3 mg/dL — ABNORMAL LOW (ref 6–20)
Calcium, Ion: 1.2 mmol/L (ref 1.15–1.40)
Chloride: 98 mmol/L (ref 98–111)
Creatinine, Ser: <0.2 mg/dL — ABNORMAL LOW (ref 0.44–1.00)
Glucose, Bld: 145 mg/dL — ABNORMAL HIGH (ref 70–99)
HCT: 44 % (ref 36.0–46.0)
Hemoglobin: 15 g/dL (ref 12.0–15.0)
Potassium: 2.8 mmol/L — ABNORMAL LOW (ref 3.5–5.1)
Sodium: 137 mmol/L (ref 135–145)
TCO2: 25 mmol/L (ref 22–32)

## 2020-02-24 LAB — BASIC METABOLIC PANEL
Anion gap: 10 (ref 5–15)
Anion gap: 6 (ref 5–15)
BUN: <5 mg/dL — ABNORMAL LOW (ref 6–20)
BUN: <5 mg/dL — ABNORMAL LOW (ref 6–20)
CO2: 24 mmol/L (ref 22–32)
CO2: 23 mmol/L (ref 22–32)
Calcium: 7.9 mg/dL — ABNORMAL LOW (ref 8.9–10.3)
Calcium: 7.5 mg/dL — ABNORMAL LOW (ref 8.9–10.3)
Chloride: 99 mmol/L (ref 98–111)
Chloride: 105 mmol/L (ref 98–111)
Creatinine, Ser: <0.3 mg/dL — ABNORMAL LOW (ref 0.44–1.00)
Creatinine, Ser: 0.31 mg/dL — ABNORMAL LOW (ref 0.44–1.00)
GFR, Estimated: >60 mL/min (ref >60)
Glucose, Bld: 186 mg/dL — ABNORMAL HIGH (ref 70–99)
Glucose, Bld: 164 mg/dL — ABNORMAL HIGH (ref 70–99)
Potassium: 2.7 mmol/L — CL (ref 3.5–5.1)
Potassium: 2.9 mmol/L — ABNORMAL LOW (ref 3.5–5.1)
Sodium: 133 mmol/L — ABNORMAL LOW (ref 135–145)
Sodium: 134 mmol/L — ABNORMAL LOW (ref 135–145)

## 2020-02-24 LAB — MAGNESIUM
Magnesium: 1.6 mg/dL — ABNORMAL LOW (ref 1.7–2.4)
Magnesium: 1.8 mg/dL (ref 1.7–2.4)

## 2020-02-24 LAB — CBC
HCT: 32.7 % — ABNORMAL LOW (ref 36.0–46.0)
Hemoglobin: 11 g/dL — ABNORMAL LOW (ref 12.0–15.0)
MCH: 30.3 pg (ref 26.0–34.0)
MCHC: 33.6 g/dL (ref 30.0–36.0)
MCV: 90.1 fL (ref 80.0–100.0)
Platelets: 176 10*3/uL (ref 150–400)
RBC: 3.63 MIL/uL — ABNORMAL LOW (ref 3.87–5.11)
RDW: 13.7 % (ref 11.5–15.5)
WBC: 14.4 10*3/uL — ABNORMAL HIGH (ref 4.0–10.5)
nRBC: 0 % (ref 0.0–0.2)

## 2020-02-24 LAB — CBG MONITORING, ED
Glucose-Capillary: 125 mg/dL — ABNORMAL HIGH (ref 70–99)
Glucose-Capillary: 139 mg/dL — ABNORMAL HIGH (ref 70–99)
Glucose-Capillary: 140 mg/dL — ABNORMAL HIGH (ref 70–99)
Glucose-Capillary: 140 mg/dL — ABNORMAL HIGH (ref 70–99)
Glucose-Capillary: 149 mg/dL — ABNORMAL HIGH (ref 70–99)
Glucose-Capillary: 165 mg/dL — ABNORMAL HIGH (ref 70–99)
Glucose-Capillary: 167 mg/dL — ABNORMAL HIGH (ref 70–99)
Glucose-Capillary: 169 mg/dL — ABNORMAL HIGH (ref 70–99)
Glucose-Capillary: 173 mg/dL — ABNORMAL HIGH (ref 70–99)
Glucose-Capillary: 174 mg/dL — ABNORMAL HIGH (ref 70–99)
Glucose-Capillary: 175 mg/dL — ABNORMAL HIGH (ref 70–99)
Glucose-Capillary: 99 mg/dL (ref 70–99)

## 2020-02-24 LAB — COMPREHENSIVE METABOLIC PANEL
ALT: 17 U/L (ref 0–44)
AST: 19 U/L (ref 15–41)
Albumin: 2.5 g/dL — ABNORMAL LOW (ref 3.5–5.0)
Alkaline Phosphatase: 51 U/L (ref 38–126)
Anion gap: 9 (ref 5–15)
BUN: <5 mg/dL — ABNORMAL LOW (ref 6–20)
CO2: 23 mmol/L (ref 22–32)
Calcium: 7.9 mg/dL — ABNORMAL LOW (ref 8.9–10.3)
Chloride: 101 mmol/L (ref 98–111)
Creatinine, Ser: <0.3 mg/dL — ABNORMAL LOW (ref 0.44–1.00)
Glucose, Bld: 188 mg/dL — ABNORMAL HIGH (ref 70–99)
Potassium: 2.8 mmol/L — ABNORMAL LOW (ref 3.5–5.1)
Sodium: 133 mmol/L — ABNORMAL LOW (ref 135–145)
Total Bilirubin: 0.7 mg/dL (ref 0.3–1.2)
Total Protein: 5.5 g/dL — ABNORMAL LOW (ref 6.5–8.1)

## 2020-02-24 LAB — LIPASE, BLOOD: Lipase: 27 U/L (ref 11–51)

## 2020-02-24 LAB — GLUCOSE, CAPILLARY
Glucose-Capillary: 130 mg/dL — ABNORMAL HIGH (ref 70–99)
Glucose-Capillary: 134 mg/dL — ABNORMAL HIGH (ref 70–99)
Glucose-Capillary: 166 mg/dL — ABNORMAL HIGH (ref 70–99)

## 2020-02-24 LAB — TRIGLYCERIDES: Triglycerides: 1227 mg/dL — ABNORMAL HIGH (ref <150)

## 2020-02-24 MED ORDER — CALCIUM GLUCONATE-NACL 2-0.675 GM/100ML-% IV SOLN
2.0000 g | INTRAVENOUS | Status: AC
  Administered 2020-02-24: 2000 mg via INTRAVENOUS
  Filled 2020-02-24: qty 100

## 2020-02-24 MED ORDER — CHLORHEXIDINE GLUCONATE CLOTH 2 % EX PADS
6.0000 | MEDICATED_PAD | Freq: Every day | CUTANEOUS | Status: DC
  Administered 2020-02-25 – 2020-02-29 (×5): 6 via TOPICAL

## 2020-02-24 MED ORDER — POTASSIUM CHLORIDE 10 MEQ/100ML IV SOLN
10.0000 meq | INTRAVENOUS | Status: AC
  Administered 2020-02-24 (×3): 10 meq via INTRAVENOUS
  Filled 2020-02-24 (×3): qty 100

## 2020-02-24 MED ORDER — ACETAMINOPHEN 325 MG PO TABS: 650.0000 mg | ORAL_TABLET | ORAL | Status: DC | PRN

## 2020-02-24 MED ORDER — SODIUM CHLORIDE 0.9 % IV SOLN
INTRAVENOUS | Status: AC
  Filled 2020-02-24 (×4): qty 200

## 2020-02-24 MED ORDER — POTASSIUM CHLORIDE 10 MEQ/100ML IV SOLN: 10.0000 meq | INTRAVENOUS | Status: AC

## 2020-02-24 MED ORDER — DIPHENHYDRAMINE HCL 25 MG PO CAPS: 25.0000 mg | ORAL_CAPSULE | Freq: Four times a day (QID) | ORAL | Status: DC | PRN

## 2020-02-24 MED ORDER — SODIUM CHLORIDE 0.9 % IV SOLN: Freq: Once | INTRAVENOUS | Status: DC

## 2020-02-24 MED ORDER — DIPHENHYDRAMINE HCL 25 MG PO CAPS
ORAL_CAPSULE | ORAL | Status: AC
  Administered 2020-02-24: 25 mg via ORAL
  Filled 2020-02-24: qty 1

## 2020-02-24 MED ORDER — HEPARIN SODIUM (PORCINE) 1000 UNIT/ML IJ SOLN
INTRAMUSCULAR | Status: AC
  Administered 2020-02-24: 2600 [IU]
  Filled 2020-02-24: qty 3

## 2020-02-24 MED ORDER — CALCIUM GLUCONATE 10 % IV SOLN: 2.0000 g | Freq: Once | INTRAVENOUS | Status: DC

## 2020-02-24 MED ORDER — MAGNESIUM SULFATE IN D5W 1-5 GM/100ML-% IV SOLN
1.0000 g | Freq: Once | INTRAVENOUS | Status: AC
  Administered 2020-02-24: 1 g via INTRAVENOUS
  Filled 2020-02-24: qty 100

## 2020-02-24 MED ORDER — CALCIUM CARBONATE ANTACID 500 MG PO CHEW
CHEWABLE_TABLET | ORAL | Status: AC
  Administered 2020-02-24: 400 mg
  Filled 2020-02-24: qty 4

## 2020-02-24 MED ORDER — KCL IN DEXTROSE-NACL 40-5-0.9 MEQ/L-%-% IV SOLN
INTRAVENOUS | Status: DC
  Filled 2020-02-24 (×7): qty 1000

## 2020-02-24 MED ORDER — ACD FORMULA A 0.73-2.45-2.2 GM/100ML VI SOLN: 1000.0000 mL | Status: DC

## 2020-02-24 MED ORDER — HEPARIN SODIUM (PORCINE) 1000 UNIT/ML IJ SOLN: 1000.0000 [IU] | Freq: Once | INTRAMUSCULAR | Status: DC

## 2020-02-24 MED ORDER — ACD FORMULA A 0.73-2.45-2.2 GM/100ML VI SOLN
Status: AC
  Administered 2020-02-24: 1000 mL
  Filled 2020-02-24: qty 500

## 2020-02-24 NOTE — ED Notes (Signed)
Checked with {harm and they stated that Mag sulfate and K 40 mEq can be run together

## 2020-02-24 NOTE — ED Notes (Signed)
Pt is sinus tach on the monitor 

## 2020-02-24 NOTE — ED Notes (Signed)
Ordered breakfast 

## 2020-02-24 NOTE — ED Notes (Signed)
Dinner Tray Ordered @ 1704. 

## 2020-02-24 NOTE — Progress Notes (Signed)
PROGRESS NOTE  Sabrina Hanna WPY:[id number] DOB: 29-Apr-1975 DOA: 02/23/2020 PCP: Leotis Shames, MD  Brief History   44 year old Hispanic female-with history of DM-2, HTN, HLD-presented to [facility] with severe upper abdominal pain-found to have acute pancreatitis due to hypertriglyceridemia.  CT abdomen with changes consistent with pancreatitis-but no necrosis.  She was started on IV antibiotics with gram negative coverage.  The patient was transferred to [facility] on 02/23/2020 so that she may receive plasma exchange. Catheter was placed to allow this to happen. She is receiving IV fluids, IV insulin, and pain control.   Consultants  . Nephrology  Procedures  . Placement of HD catheter for plasma exchange  Antibiotics   Anti-infectives (From admission, onward)   Start     Dose/Rate Route Frequency Ordered Stop   02/23/20 2200  meropenem (MERREM) 1 g in sodium chloride 0.9 % 100 mL IVPB  Status:  Discontinued        1 g 200 mL/hr over 30 Minutes Intravenous Every 8 hours 02/23/20 2125 02/23/20 2126   02/23/20 2130  meropenem (MERREM) 1 g in sodium chloride 0.9 % 100 mL IVPB        1 g 200 mL/hr over 30 Minutes Intravenous Every 8 hours 02/23/20 2126      .  Subjective  The patient is lying quietly on the gurney as she is being roomed in the ED.   Objective   Vitals:  Vitals:   02/24/20 1245 02/24/20 1300  BP: 94/65 98/71  Pulse: 96 87  Resp: (!) 21 (!) 27  Temp:    SpO2: 98% 96%    Exam:  Constitutional:  . The patient is somnolent, but easily awakened.  She is in moderate distress from epigastric pain. Respiratory:  . No increased work of breathing. . No wheezes, rales, or rhonchi . No tactile fremitus Cardiovascular:  . Regular rate and rhythm . No murmurs, ectopy, or gallups. . No lateral PMI. No thrills. Abdomen:  . Abdomen is soft, but distended and tender in the epigastrum. . No hernias, masses, or organomegaly . Normoactive bowel sounds.   Musculoskeletal:  . No cyanosis, clubbing, or edema Skin:  . No rashes, lesions, ulcers . palpation of skin: no induration or nodules Neurologic:  . CN 2-12 intact . Sensation all 4 extremities intact Psychiatric:  . Mental status o Mood, affect appropriate o Orientation to person, place, time  . judgment and insight appear intact  I have personally reviewed the following:   Today's Data  . Vitals, BMP, H&H, Triglycerides  Micro Data  . Blood cultures x 2: No growth  Imaging  . CT abdomen and pelvis  Cardiology Data  . EKG  Scheduled Meds: . atorvastatin  20 mg Oral QHS  . enoxaparin (LOVENOX) injection  40 mg Subcutaneous Q24H  . fenofibrate  54 mg Oral Daily  . pantoprazole (PROTONIX) IV  40 mg Intravenous Q24H   Continuous Infusions: . dextrose 5 % and 0.9 % NaCl with KCl 40 mEq/L 150 mL/hr at 02/24/20 1232  . insulin 4.4 Units/hr (02/24/20 1509)  . meropenem (MERREM) IV Stopped (02/24/20 0729)  . potassium chloride      Active Problems:   Pancreatitis   LOS: 1 day   A & P  Acute pancreatitis due to hypertriglyceridemia: Continue n.p.o. status-continue IV insulin infusion-IV fluids, as needed narcotics and antiemetics.  Nephrology has already consulted-with plans for HD catheter placement to initiate plasmapheresis starting tomorrow.  Watch closely-for signs of necrosis/instability.   Note-no history  of EtOH use-we will obtain limited RUQ ultrasound to make sure no cholelithiasis-however clinically this appears to be pancreatitis secondary to hypertriglyceridemia. She is kept NPO.   Sepsis with hypotension, tachycardia, leukocytosis due to pancreatitis. IV fluid bolus. No lactic acidosis.  DM II: Hold all oral hypoglycemic agents. The patient is receiving IV insulin for pancreatitis related to triglyceridemia.  Has mild elevated anion gap acidosis which is due tostarvation ketoacidosis from vomiting/n.p.o. status.  HLD: Continue  statin/fenofibrate.  Hypokalemia: Supplement and monitor.   Hypomagnesemia: Supplement and monitor.  I have seen and examined this patient myself. I have spent 35 minutes in her evaluation and care.  Adhvik Canady, DO [facility] Direct contact: see [email or web address]  7PM-7AM contact night coverage as above 02/24/2020, 4:30 PM  LOS: 1 day

## 2020-02-24 NOTE — ED Notes (Signed)
At report Rnn stated that the 3rd bag of K was finishing up and that the Pt had been switched to 40 mEq in NS and 5% dextrose. Attending had confirmed same.

## 2020-02-24 NOTE — ED Notes (Signed)
Checked patient cbg it was 58 notified RN Joe of blood sugar patient is resting with call bell in reach

## 2020-02-24 NOTE — ED Notes (Signed)
Hospital Bed (Transporter)ordered @ 1049-per Percival Spanish, RN ordered by Marylene Land

## 2020-02-24 NOTE — Progress Notes (Signed)
Nephrology Follow-Up Consult note   Assessment/Recommendations: Sabrina Hanna is a/an 44 y.o. 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 Was >5000 now 1,227 with medical management. Given her severe fetures including tachycardia will treat w/ PLEX today. Goal is triglycerides < 1000 so likely only needs one session -Triglycerides daily -Hold PLEX if <1000 -Continue aggressive medical mgmt per primary team w/ insulin and fluids  Hypokalemia: 2/2 insulin use and fluids -Switch fluid to include of K per liter and supplement with IV k as needed  Hyponatremia: pseudo from severe hypertriglyceridemia now normal. Serum osm normal   Recommendations conveyed to primary service.    Darnell Level [facility] 02/24/2020 11:50 AM  ___________________________________________________________  CC: Hypertriglyceridemia   Interval History/Subjective: Stable on fluids and insulin. Triglycerides improved to 1,227. Undergoing PLEX today with no issues. Abdominal pain and nausea ongoing but improved. K low at 2.8 requiring IV repletion.   Medications:  Current Facility-Administered Medications  Medication Dose Route Frequency Provider Last Rate Last Admin  . acetaminophen (TYLENOL) tablet 650 mg  650 mg Oral Q6H PRN Maretta Bees, MD   650 mg at 02/24/20 0747   Or  . acetaminophen (TYLENOL) suppository 650 mg  650 mg Rectal Q6H PRN Maretta Bees, MD      . atorvastatin (LIPITOR) tablet 20 mg  20 mg Oral QHS Ghimire, Shanker M, MD      . dextrose 5 % and 0.9 % NaCl with KCl 40 mEq/L infusion   Intravenous Continuous Darnell Level, MD      . dextrose 50 % solution 0-50 mL  0-50 mL Intravenous PRN Maretta Bees, MD      . enoxaparin (LOVENOX)  injection 40 mg  40 mg Subcutaneous Q24H Maretta Bees, MD   40 mg at 02/23/20 2305  . fenofibrate tablet 54 mg  54 mg Oral Daily Maretta Bees, MD   54 mg at 02/24/20 1059  . hydrALAZINE (APRESOLINE) injection 10 mg  10 mg Intravenous Q4H PRN Maretta Bees, MD      . HYDROmorphone (DILAUDID) injection 0.5-1 mg  0.5-1 mg Intravenous Q2H PRN Maretta Bees, MD   1 mg at 02/24/20 0655  . insulin regular, human (MYXREDLIN) 100 units/ 100 mL infusion   Intravenous Continuous Maretta Bees, MD 4.4 mL/hr at 02/24/20 0349 4.4 Units/hr at 02/24/20 0349  . magnesium sulfate IVPB 1 g 100 mL  1 g Intravenous Once Opyd, Lavone Neri, MD      . meropenem (MERREM) 1 g in sodium chloride 0.9 % 100 mL IVPB  1 g Intravenous Q8H Maretta Bees, MD   Stopped at 02/24/20 [phone]  . oxyCODONE (Oxy IR/ROXICODONE) immediate release tablet 5 mg  5 mg Oral Q4H PRN Maretta Bees, MD      . pantoprazole (PROTONIX) injection 40 mg  40 mg Intravenous Q24H Maretta Bees, MD   40 mg at 02/23/20 1850  . potassium chloride 10 mEq in 100 mL IVPB  10 mEq Intravenous Q1 Hr x 4 Opyd, Lavone Neri, MD 100 mL/hr at 02/24/20 1103 10 mEq at 02/24/20 1103  . potassium chloride 10 mEq in 100 mL IVPB  10 mEq Intravenous Q1 Hr x 4 Swayze, Ava, DO       Current Outpatient Medications  Medication Sig Dispense Refill  .  atorvastatin (LIPITOR) 20 MG tablet Take 20 mg by mouth at bedtime.  0  . esomeprazole (NEXIUM) 20 MG capsule Take 20 mg by mouth daily.    . famotidine (PEPCID) 20 MG tablet Take 20 mg by mouth 2 (two) times daily.    . fenofibrate (TRICOR) 145 MG tablet Take 145 mg by mouth daily.  0  . gabapentin (NEURONTIN) 100 MG capsule Take 100 mg by mouth 3 (three) times daily.    . insulin aspart (NOVOLOG FLEXPEN) 100 UNIT/ML FlexPen Inject 5-15 Units into the skin 3 (three) times daily with meals. Uses sliding scale 3 mL 5  . LANTUS 100 UNIT/ML injection Inject 0.16 mLs (16 Units total) into the skin at  bedtime. 10 mL 3  . metFORMIN (GLUCOPHAGE) 500 MG tablet Take 1,000 mg by mouth 2 (two) times daily.    . Omega-3 1000 MG CAPS Take 1,000 mg by mouth in the morning and at bedtime.    Marland Kitchen omeprazole (PRILOSEC) 20 MG capsule Take 20 mg by mouth daily.        Review of Systems: 10 systems reviewed and negative except per interval history/subjective  Physical Exam: Vitals:   02/24/20 1024 02/24/20 1100  BP: 95/60 91/60  Pulse: 97 95  Resp: 20 12  Temp: 99.6 F (37.6 C)   SpO2: 99% 99%   Total I/O In: 300 [IV Piggyback:300] Out: -   Intake/Output Summary (Last 24 hours) at 02/24/2020 1150 Last data filed at 02/24/2020 1106 Gross per 24 hour  Intake 2400 ml  Output 600 ml  Net 1800 ml   Constitutional: well-appearing, no acute distress ENMT: ears and nose without scars or lesions, MMM CV: normal rate, no edema Respiratory: bilateral chest rise, normal work of breathing Gastrointestinal: soft, tender to palpation, no palpable masses or hernias Skin: no visible lesions or rashes Psych: alert, judgement/insight appropriate, appropriate mood and affect   Test Results I personally reviewed new and old clinical labs and radiology tests Lab Results  Component Value Date   NA 137 02/24/2020   K 2.8 (L) 02/24/2020   CL 98 02/24/2020   CO2 24 02/24/2020   BUN <3 (L) 02/24/2020   CREATININE <0.20 (L) 02/24/2020   CALCIUM 7.9 (L) 02/24/2020   ALBUMIN 2.5 (L) 02/24/2020   PHOS 2.4 (L) 02/24/2020

## 2020-02-24 NOTE — ED Notes (Signed)
Pt transported to dialysis

## 2020-02-24 NOTE — ED Notes (Signed)
This RN attempted to call report to 4E x2. Was told by secretary that RN is busy in another room. This RN requested to give report to 4E charge nurse, and was told that 4E charge nurse is also in another room and cannot take report. This RN was informed that 4E RN will call back for report.

## 2020-02-24 NOTE — ED Notes (Signed)
This RN attempted to call report to 4E RN. 4E RN took this RN's phone number and said that she will call back within the next few minutes

## 2020-02-24 NOTE — Progress Notes (Addendum)
Inpatient Diabetes Program Recommendations  AACE/ADA: New Consensus Statement on Inpatient Glycemic Control (2015)  Target Ranges:  Prepandial:   less than 140 mg/dL      Peak postprandial:   less than 180 mg/dL (1-2 hours)      Critically ill patients:  140 - 180 mg/dL   Lab Results  Component Value Date   GLUCAP 134 (H) 02/24/2020   HGBA1C 10.8 (H) 02/23/2020    Review of Glycemic Control Results for TIFANNY, DOLLENS (MRN [id number]) as of 02/24/2020 09:17  Ref. Range 02/24/2020 03:48 02/24/2020 06:52 02/24/2020 09:10  Glucose-Capillary Latest Ref Range: 70 - 99 mg/dL 211 (H) 941 (H) 740 (H)   Diabetes history:  DM2 Outpatient Diabetes medications: Lantus 16 units QHS Novolog 5-15 units TID Current orders for Inpatient glycemic control:  IV Insulin  Inpatient Diabetes Program Recommendations:     Patient currently in HD for plasmapheresis.  Spoke with HD RN.  IV insulin is infusing.   When MD is ready to transition to sq insulin please consider,  Lantus 8 units 2 hours prior to discontinuing IV insulin Novolog 0-15 units TID & 0-5 units QHS  Will continue to follow while inpatient.  Thank you, Dulce Sellar, RN, BSN Diabetes Coordinator Inpatient Diabetes Program [phone] (team pager from 8a-5p)

## 2020-02-24 NOTE — ED Notes (Signed)
Attending just assessed {t

## 2020-02-24 NOTE — Progress Notes (Signed)
Pt arrived to 4E01 from ED. CHG bath completed and new gown applied. Tele applied and CCMD notified. VS stable. Pt continues to complain of abdominal pain. Pt oriented to room, bed, and call bell. Will continue to monitor.  Willa Frater, RN

## 2020-02-24 NOTE — ED Notes (Signed)
Pt states that her pain is much better, she stated her pain dropped from a 9 down to a 6

## 2020-02-24 NOTE — ED Notes (Signed)
Notified Dr. Gerri Lins of pt's temp

## 2020-02-25 LAB — BASIC METABOLIC PANEL
Anion gap: 7 (ref 5–15)
Anion gap: 7 (ref 5–15)
BUN: <5 mg/dL — ABNORMAL LOW (ref 6–20)
BUN: <5 mg/dL — ABNORMAL LOW (ref 6–20)
CO2: 25 mmol/L (ref 22–32)
CO2: 23 mmol/L (ref 22–32)
Calcium: 7.7 mg/dL — ABNORMAL LOW (ref 8.9–10.3)
Calcium: 7.7 mg/dL — ABNORMAL LOW (ref 8.9–10.3)
Chloride: 107 mmol/L (ref 98–111)
Chloride: 107 mmol/L (ref 98–111)
Creatinine, Ser: 0.3 mg/dL — ABNORMAL LOW (ref 0.44–1.00)
Creatinine, Ser: <0.3 mg/dL — ABNORMAL LOW (ref 0.44–1.00)
GFR, Estimated: >60 mL/min (ref >60)
Glucose, Bld: 137 mg/dL — ABNORMAL HIGH (ref 70–99)
Glucose, Bld: 147 mg/dL — ABNORMAL HIGH (ref 70–99)
Potassium: 3.1 mmol/L — ABNORMAL LOW (ref 3.5–5.1)
Potassium: 3.6 mmol/L (ref 3.5–5.1)
Sodium: 139 mmol/L (ref 135–145)
Sodium: 137 mmol/L (ref 135–145)

## 2020-02-25 LAB — GLUCOSE, CAPILLARY
Glucose-Capillary: 102 mg/dL — ABNORMAL HIGH (ref 70–99)
Glucose-Capillary: 107 mg/dL — ABNORMAL HIGH (ref 70–99)
Glucose-Capillary: 112 mg/dL — ABNORMAL HIGH (ref 70–99)
Glucose-Capillary: 114 mg/dL — ABNORMAL HIGH (ref 70–99)
Glucose-Capillary: 116 mg/dL — ABNORMAL HIGH (ref 70–99)
Glucose-Capillary: 117 mg/dL — ABNORMAL HIGH (ref 70–99)
Glucose-Capillary: 129 mg/dL — ABNORMAL HIGH (ref 70–99)
Glucose-Capillary: 131 mg/dL — ABNORMAL HIGH (ref 70–99)
Glucose-Capillary: 133 mg/dL — ABNORMAL HIGH (ref 70–99)
Glucose-Capillary: 134 mg/dL — ABNORMAL HIGH (ref 70–99)
Glucose-Capillary: 136 mg/dL — ABNORMAL HIGH (ref 70–99)
Glucose-Capillary: 136 mg/dL — ABNORMAL HIGH (ref 70–99)
Glucose-Capillary: 137 mg/dL — ABNORMAL HIGH (ref 70–99)
Glucose-Capillary: 140 mg/dL — ABNORMAL HIGH (ref 70–99)
Glucose-Capillary: 140 mg/dL — ABNORMAL HIGH (ref 70–99)
Glucose-Capillary: 140 mg/dL — ABNORMAL HIGH (ref 70–99)
Glucose-Capillary: 142 mg/dL — ABNORMAL HIGH (ref 70–99)
Glucose-Capillary: 142 mg/dL — ABNORMAL HIGH (ref 70–99)
Glucose-Capillary: 144 mg/dL — ABNORMAL HIGH (ref 70–99)
Glucose-Capillary: 147 mg/dL — ABNORMAL HIGH (ref 70–99)
Glucose-Capillary: 149 mg/dL — ABNORMAL HIGH (ref 70–99)
Glucose-Capillary: 158 mg/dL — ABNORMAL HIGH (ref 70–99)
Glucose-Capillary: 95 mg/dL (ref 70–99)

## 2020-02-25 LAB — TRIGLYCERIDES: Triglycerides: 358 mg/dL — ABNORMAL HIGH (ref <150)

## 2020-02-25 MED ORDER — MAGNESIUM SULFATE 2 GM/50ML IV SOLN
2.0000 g | Freq: Once | INTRAVENOUS | Status: AC
  Administered 2020-02-25: 2 g via INTRAVENOUS
  Filled 2020-02-25: qty 50

## 2020-02-25 MED ORDER — SODIUM CHLORIDE 0.9 % IV SOLN: INTRAVENOUS | Status: DC | PRN

## 2020-02-25 MED ORDER — POTASSIUM CHLORIDE 10 MEQ/100ML IV SOLN
10.0000 meq | INTRAVENOUS | Status: AC
  Administered 2020-02-25 (×3): 10 meq via INTRAVENOUS
  Filled 2020-02-25 (×3): qty 100

## 2020-02-25 NOTE — Progress Notes (Signed)
Nephrology Follow-Up Consult note   Assessment/Recommendations: Sabrina Hanna is a/an 45 y.o. 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 Was >5000 now 38 with medical management. Given her severe fetures including tachycardia we treated with plex x1 but now <1000 and improving symptoms so no further PLEX. Goal is triglycerides < 1000 so likely only needs one session -F/u triglycerides tomorrow; if remains < 1000 will sign off -Further pancreatitis mgmt per primary team with insulin and fluids  Hypokalemia: 2/2 insulin use and fluids -Continue supplemental IV K as needed  Hyponatremia: pseudo from severe hypertriglyceridemia now normal. Serum osm normal. No resolved with improvement in triglycerides   Recommendations conveyed to primary service.    Darnell Level [facility] 02/25/2020 9:00 AM  ___________________________________________________________  CC: Hypertriglyceridemia   Interval History/Subjective: Overall improving. Pain improving.   Medications:  Current Facility-Administered Medications  Medication Dose Route Frequency Provider Last Rate Last Admin  . acetaminophen (TYLENOL) tablet 650 mg  650 mg Oral Q6H PRN Maretta Bees, MD   650 mg at 02/24/20 0747   Or  . acetaminophen (TYLENOL) suppository 650 mg  650 mg Rectal Q6H PRN Maretta Bees, MD      . atorvastatin (LIPITOR) tablet 20 mg  20 mg Oral QHS Maretta Bees, MD   20 mg at 02/24/20 2243  . Chlorhexidine Gluconate Cloth 2 % PADS 6 each  6 each Topical Daily Swayze, Ava, DO      . dextrose 5 % and 0.9 % NaCl with KCl 40 mEq/L infusion   Intravenous Continuous Darnell Level, MD 150 mL/hr at 02/25/20 0340 New Bag at 02/25/20 0340  . dextrose 50 % solution 0-50 mL   0-50 mL Intravenous PRN Maretta Bees, MD      . enoxaparin (LOVENOX) injection 40 mg  40 mg Subcutaneous Q24H Maretta Bees, MD   40 mg at 02/24/20 1953  . fenofibrate tablet 54 mg  54 mg Oral Daily Maretta Bees, MD   54 mg at 02/24/20 1059  . hydrALAZINE (APRESOLINE) injection 10 mg  10 mg Intravenous Q4H PRN Maretta Bees, MD      . HYDROmorphone (DILAUDID) injection 0.5-1 mg  0.5-1 mg Intravenous Q2H PRN Maretta Bees, MD   1 mg at 02/25/20 0803  . insulin regular, human (MYXREDLIN) 100 units/ 100 mL infusion   Intravenous Continuous Maretta Bees, MD 2.6 mL/hr at 02/25/20 0758 2.6 Units/hr at 02/25/20 0758  . meropenem (MERREM) 1 g in sodium chloride 0.9 % 100 mL IVPB  1 g Intravenous Q8H Ghimire, Shanker M, MD 200 mL/hr at 02/25/20 0522 1 g at 02/25/20 0522  . oxyCODONE (Oxy IR/ROXICODONE) immediate release tablet 5 mg  5 mg Oral Q4H PRN Maretta Bees, MD   5 mg at 02/25/20 0527  . pantoprazole (PROTONIX) injection 40 mg  40 mg Intravenous Q24H Maretta Bees, MD   40 mg at 02/24/20 1641      Review of Systems: 10 systems reviewed and negative except per interval history/subjective  Physical Exam: Vitals:   02/25/20 0334 02/25/20 0741  BP: 95/65 107/69  Pulse: 90 88  Resp: 20 19  Temp: 98 F (36.7 C) 98.5 F (36.9 C)  SpO2: 96% 98%   No intake/output data recorded.  Intake/Output Summary (Last 24 hours) at 02/25/2020 0900  Last data filed at 02/25/2020 1252 Gross per 24 hour  Intake 3000.02 ml  Output 700 ml  Net 2300.02 ml   Constitutional: well-appearing, no acute distress ENMT: ears and nose without scars or lesions, MMM CV: normal rate, no edema Respiratory: bilateral chest rise, normal work of breathing Gastrointestinal: soft, tender to palpation, no palpable masses or hernias Skin: no visible lesions or rashes Psych: alert, judgement/insight appropriate, appropriate mood and affect   Test Results I personally reviewed new and  old clinical labs and radiology tests Lab Results  Component Value Date   NA 139 02/25/2020   K 3.1 (L) 02/25/2020   CL 107 02/25/2020   CO2 25 02/25/2020   BUN <5 (L) 02/25/2020   CREATININE 0.30 (L) 02/25/2020   CALCIUM 7.7 (L) 02/25/2020   ALBUMIN 2.5 (L) 02/24/2020   PHOS 2.4 (L) 02/24/2020

## 2020-02-25 NOTE — Progress Notes (Addendum)
PROGRESS NOTE  Sabrina Hanna ASN:[id number] DOB: 1975-08-04 DOA: 02/23/2020 PCP: Leotis Shames, MD  Brief History   45 year old Hispanic female-with history of DM-2, HTN, HLD-presented to [facility] with severe upper abdominal pain-found to have acute pancreatitis due to hypertriglyceridemia.  CT abdomen with changes consistent with pancreatitis-but no necrosis.  She was started on IV antibiotics with gram negative coverage.  The patient was transferred to [facility] on 02/23/2020 so that she may receive plasma exchange. Catheter was placed to allow this to happen. She is receiving IV fluids, IV insulin, and pain control.   Consultants  . Nephrology  Procedures  . Placement of HD catheter for plasma exchange  Antibiotics   Anti-infectives (From admission, onward)   Start     Dose/Rate Route Frequency Ordered Stop   02/23/20 2200  meropenem (MERREM) 1 g in sodium chloride 0.9 % 100 mL IVPB  Status:  Discontinued        1 g 200 mL/hr over 30 Minutes Intravenous Every 8 hours 02/23/20 2125 02/23/20 2126   02/23/20 2130  meropenem (MERREM) 1 g in sodium chloride 0.9 % 100 mL IVPB        1 g 200 mL/hr over 30 Minutes Intravenous Every 8 hours 02/23/20 2126       Subjective  The patient is lying quietly on the gurney as she is being roomed in the ED.   Objective   Vitals:  Vitals:   02/25/20 0741 02/25/20 1127  BP: 107/69 99/70  Pulse: 88 86  Resp: 19 20  Temp: 98.5 F (36.9 C) 98.7 F (37.1 C)  SpO2: 98% 98%    Exam:  Constitutional:  . The patient is somnolent, but easily awakened.  She is in moderate distress from epigastric pain. Respiratory:  . No increased work of breathing. . No wheezes, rales, or rhonchi . No tactile fremitus Cardiovascular:  . Regular rate and rhythm . No murmurs, ectopy, or gallups. . No lateral PMI. No thrills. Abdomen:  . Abdomen is soft, distended and a little more tender in the epigastrum. . No hernias, masses, or  organomegaly . Normoactive bowel sounds.  Musculoskeletal:  . No cyanosis, clubbing, or edema Skin:  . No rashes, lesions, ulcers . palpation of skin: no induration or nodules Neurologic:  . CN 2-12 intact . Sensation all 4 extremities intact Psychiatric:  . Mental status o Mood, affect appropriate o Orientation to person, place, time  . judgment and insight appear intact  I have personally reviewed the following:   Today's Data  . Vitals, BMP, H&H, Triglycerides. Glucoses  Micro Data  . Blood cultures x 2: No growth  Imaging  . CT abdomen and pelvis  Cardiology Data  . EKG  Scheduled Meds: . atorvastatin  20 mg Oral QHS  . Chlorhexidine Gluconate Cloth  6 each Topical Daily  . enoxaparin (LOVENOX) injection  40 mg Subcutaneous Q24H  . fenofibrate  54 mg Oral Daily  . pantoprazole (PROTONIX) IV  40 mg Intravenous Q24H   Continuous Infusions: . dextrose 5 % and 0.9 % NaCl with KCl 40 mEq/L 150 mL/hr at 02/25/20 1326  . insulin 2 Units/hr (02/25/20 1338)  . meropenem (MERREM) IV 1 g (02/25/20 0522)  . potassium chloride 10 mEq (02/25/20 1244)    Active Problems:   Pancreatitis   LOS: 2 days   A & P  Acute pancreatitis due to hypertriglyceridemia: Continue n.p.o. status-continue IV insulin infusion-IV fluids, as needed narcotics and antiemetics.  Nephrology has already [employer]  for HD catheter placement to initiate plasmapheresis starting tomorrow.  Watch closely-for signs of necrosis/instability.   Note-no history of EtOH use-we will obtain limited RUQ ultrasound to make sure no cholelithiasis-however clinically this appears to be pancreatitis secondary to hypertriglyceridemia. She is kept NPO. Will allow ice chips. Nephrology feels that the patient is likely not to require more than one session of PLEX. Will recheck Triglycerides tomorrow.  Sepsis with hypotension, tachycardia, leukocytosis due to pancreatitis. IV fluid bolus. No lactic acidosis.  DM  II: Hold all oral hypoglycemic agents. The patient is receiving IV insulin for pancreatitis related to triglyceridemia.  Has mild elevated anion gap acidosis which is due tostarvation ketoacidosis from vomiting/n.p.o. status. FSBS has een 116 - 158.  HLD: Continue statin/fenofibrate.  Hypokalemia: Supplement and monitor.   Hypomagnesemia: Supplement and monitor.  I have seen and examined this patient myself. I have spent 32 minutes in her evaluation and care.  Jakaree Pickard, DO [facility] Direct contact: see [email or web address]  7PM-7AM contact night coverage as above 02/25/2020, 4:30 PM  LOS: 1 day

## 2020-02-26 ENCOUNTER — Inpatient Hospital Stay (HOSPITAL_COMMUNITY): Payer: BLUE CROSS/BLUE SHIELD

## 2020-02-26 DIAGNOSIS — E1169 Type 2 diabetes mellitus with other specified complication: Secondary | ICD-10-CM

## 2020-02-26 DIAGNOSIS — K858 Other acute pancreatitis without necrosis or infection: Secondary | ICD-10-CM | POA: Diagnosis not present

## 2020-02-26 DIAGNOSIS — E785 Hyperlipidemia, unspecified: Secondary | ICD-10-CM

## 2020-02-26 DIAGNOSIS — E781 Pure hyperglyceridemia: Secondary | ICD-10-CM | POA: Diagnosis not present

## 2020-02-26 LAB — CBC WITH DIFFERENTIAL/PLATELET
Abs Immature Granulocytes: 0.06 10*3/uL (ref 0.00–0.07)
Basophils Absolute: 0 10*3/uL (ref 0.0–0.1)
Basophils Relative: 0 %
Eosinophils Absolute: 0.3 10*3/uL (ref 0.0–0.5)
Eosinophils Relative: 3 %
HCT: 30.6 % — ABNORMAL LOW (ref 36.0–46.0)
Hemoglobin: 9.8 g/dL — ABNORMAL LOW (ref 12.0–15.0)
Immature Granulocytes: 1 %
Lymphocytes Relative: 15 %
Lymphs Abs: 1.6 10*3/uL (ref 0.7–4.0)
MCH: 29.3 pg (ref 26.0–34.0)
MCHC: 32 g/dL (ref 30.0–36.0)
MCV: 91.3 fL (ref 80.0–100.0)
Monocytes Absolute: 0.6 10*3/uL (ref 0.1–1.0)
Monocytes Relative: 5 %
Neutro Abs: 8.4 10*3/uL — ABNORMAL HIGH (ref 1.7–7.7)
Neutrophils Relative %: 76 %
Platelets: 177 10*3/uL (ref 150–400)
RBC: 3.35 MIL/uL — ABNORMAL LOW (ref 3.87–5.11)
RDW: 14.1 % (ref 11.5–15.5)
WBC: 10.9 10*3/uL — ABNORMAL HIGH (ref 4.0–10.5)
nRBC: 0 % (ref 0.0–0.2)

## 2020-02-26 LAB — COMPREHENSIVE METABOLIC PANEL
ALT: 10 U/L (ref 0–44)
AST: 13 U/L — ABNORMAL LOW (ref 15–41)
Albumin: 3 g/dL — ABNORMAL LOW (ref 3.5–5.0)
Alkaline Phosphatase: 46 U/L (ref 38–126)
Anion gap: 7 (ref 5–15)
BUN: <5 mg/dL — ABNORMAL LOW (ref 6–20)
CO2: 20 mmol/L — ABNORMAL LOW (ref 22–32)
Calcium: 7.6 mg/dL — ABNORMAL LOW (ref 8.9–10.3)
Chloride: 110 mmol/L (ref 98–111)
Creatinine, Ser: <0.3 mg/dL — ABNORMAL LOW (ref 0.44–1.00)
Glucose, Bld: 147 mg/dL — ABNORMAL HIGH (ref 70–99)
Potassium: 4 mmol/L (ref 3.5–5.1)
Sodium: 137 mmol/L (ref 135–145)
Total Bilirubin: 0.7 mg/dL (ref 0.3–1.2)
Total Protein: 5.3 g/dL — ABNORMAL LOW (ref 6.5–8.1)

## 2020-02-26 LAB — TRIGLYCERIDES: Triglycerides: 247 mg/dL — ABNORMAL HIGH (ref <150)

## 2020-02-26 LAB — BASIC METABOLIC PANEL
Anion gap: 10 (ref 5–15)
BUN: <5 mg/dL — ABNORMAL LOW (ref 6–20)
CO2: 20 mmol/L — ABNORMAL LOW (ref 22–32)
Calcium: 7.9 mg/dL — ABNORMAL LOW (ref 8.9–10.3)
Chloride: 103 mmol/L (ref 98–111)
Creatinine, Ser: <0.3 mg/dL — ABNORMAL LOW (ref 0.44–1.00)
Glucose, Bld: 180 mg/dL — ABNORMAL HIGH (ref 70–99)
Potassium: 3.9 mmol/L (ref 3.5–5.1)
Sodium: 133 mmol/L — ABNORMAL LOW (ref 135–145)

## 2020-02-26 LAB — GLUCOSE, CAPILLARY
Glucose-Capillary: 136 mg/dL — ABNORMAL HIGH (ref 70–99)
Glucose-Capillary: 136 mg/dL — ABNORMAL HIGH (ref 70–99)
Glucose-Capillary: 136 mg/dL — ABNORMAL HIGH (ref 70–99)
Glucose-Capillary: 138 mg/dL — ABNORMAL HIGH (ref 70–99)
Glucose-Capillary: 140 mg/dL — ABNORMAL HIGH (ref 70–99)
Glucose-Capillary: 144 mg/dL — ABNORMAL HIGH (ref 70–99)
Glucose-Capillary: 145 mg/dL — ABNORMAL HIGH (ref 70–99)
Glucose-Capillary: 147 mg/dL — ABNORMAL HIGH (ref 70–99)
Glucose-Capillary: 148 mg/dL — ABNORMAL HIGH (ref 70–99)
Glucose-Capillary: 177 mg/dL — ABNORMAL HIGH (ref 70–99)

## 2020-02-26 LAB — LIPASE, BLOOD: Lipase: 18 U/L (ref 11–51)

## 2020-02-26 MED ORDER — SODIUM CHLORIDE 0.9 % IV SOLN: INTRAVENOUS | Status: AC

## 2020-02-26 MED ORDER — INSULIN ASPART 100 UNIT/ML ~~LOC~~ SOLN
0.0000 [IU] | Freq: Three times a day (TID) | SUBCUTANEOUS | Status: DC
  Administered 2020-02-26 – 2020-02-27 (×2): 2 [IU] via SUBCUTANEOUS
  Administered 2020-02-27: 3 [IU] via SUBCUTANEOUS
  Administered 2020-02-28: 2 [IU] via SUBCUTANEOUS
  Administered 2020-02-28: 5 [IU] via SUBCUTANEOUS
  Administered 2020-02-28: 1 [IU] via SUBCUTANEOUS
  Administered 2020-02-29: 3 [IU] via SUBCUTANEOUS

## 2020-02-26 MED ORDER — OXYCODONE HCL 5 MG PO TABS
5.0000 mg | ORAL_TABLET | ORAL | Status: DC | PRN
  Administered 2020-02-26 – 2020-02-28 (×10): 10 mg via ORAL
  Filled 2020-02-26 (×10): qty 2

## 2020-02-26 MED ORDER — IOHEXOL 300 MG/ML  SOLN
100.0000 mL | Freq: Once | INTRAMUSCULAR | Status: AC | PRN
  Administered 2020-02-26: 100 mL via INTRAVENOUS

## 2020-02-26 MED ORDER — HYDROMORPHONE HCL 1 MG/ML IJ SOLN
0.5000 mg | Freq: Once | INTRAMUSCULAR | Status: AC
  Administered 2020-02-26: 0.5 mg via INTRAVENOUS
  Filled 2020-02-26: qty 1

## 2020-02-26 MED ORDER — DIPHENHYDRAMINE HCL 25 MG PO CAPS: 25.0000 mg | ORAL_CAPSULE | Freq: Three times a day (TID) | ORAL | Status: DC | PRN

## 2020-02-26 NOTE — Progress Notes (Signed)
[facility]  PROGRESS NOTE  Sabrina Hanna EUM:[id number] DOB: 1975-12-10 DOA: 02/23/2020 PCP: Leotis Shames, MD Admit date - 02/23/2020   Admitting Physician Dewayne Shorter Levora Dredge, MD  Outpatient Primary MD for the patient is Leotis Shames, MD  LOS - 3 Brief Narrative   Sabrina Hanna is a 45 y.o. 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  CT abdomen showed pancreatitis with surrounding inflammation but no necrosis or pseudocyst.  Patient was transferred to [facility] where she received plasmapheresis guided by nephrology for hypertriglyceridemia greater than 5000.  Hospital course currently complicated by persistent abdominal pain.   Subjective  Today reports significant left-sided abdominal pain, feels like her belly is hard she states  A & P    Acute pancreatitis secondary to hypertriglyceridemia, recurrent,worsening abdominal pain  Triglycerides continue to improve (currently 247), but continues to have persistent left-sided abdominal pain with decrease ability to tolerate p.o. (initial CT showed secondary inflammation and mild peripancreatic edema). Lipase wnl -Discontinue insulin drip and D5 given TGs less than 500 -Nephrology will sign off (only required plasmapheresis x1) -Continue fenofibrate, ensure appropriate dose with pharmacy -Continue to trend triglyceride off insulin -Repeat CT abdomen to ensure no local complications from pancreatitis-continue IV fluids -As needed pain control (IV Dilaudid every 2 hours as needed, oxycodone IR added for moderate pain control)  GERD, stable -continue IV Protonix daily  HLD, stable -continue Lipitor  Type 2 diabetes.  A1c  10.8.  Blood sugars have been well controlled while on insulin infusion for above problem. -Insulin now discontinued -Given still n.p.o., monitor CBGs every 4, sliding scale as needed, hold home Metformin -If able to tolerate clear liquids will switch CBGs to with meals     Family Communication  : None at bedside  Code Status : Full  Disposition Plan  :  Patient is from home. Anticipated d/c date: 2 to 3 days. Barriers to d/c or necessity for inpatient status:  IV fluids, IV pain control, repeat CT abdomen Consults  : Nephrology (signed off)  Procedures  : Plasmapheresis x1  DVT Prophylaxis  :  Lovenox   MDM: The below labs and imaging reports were reviewed and summarized above.  Medication management as above.  Lab Results  Component Value Date   PLT 177 02/26/2020    Diet :  Diet Order            Diet clear liquid Room service appropriate? Yes; Fluid consistency: Thin  Diet effective now                  Inpatient Medications Scheduled Meds: . atorvastatin  20 mg Oral QHS  . Chlorhexidine Gluconate Cloth  6 each Topical Daily  . enoxaparin (LOVENOX) injection  40 mg Subcutaneous Q24H  . fenofibrate  54 mg Oral Daily  . pantoprazole (PROTONIX) IV  40 mg Intravenous Q24H   Continuous Infusions: . sodium chloride 10 mL/hr at 02/25/20 2117  . sodium chloride 100 mL/hr at 02/26/20 0843   PRN Meds:.sodium chloride, acetaminophen **OR** acetaminophen, dextrose, hydrALAZINE, HYDROmorphone (DILAUDID) injection, oxyCODONE  Antibiotics  :   Anti-infectives (From admission, onward)   Start     Dose/Rate Route Frequency Ordered Stop   02/23/20 2200  meropenem (MERREM) 1 g in sodium chloride 0.9 %  100 mL IVPB  Status:  Discontinued        1 g 200 mL/hr over 30 Minutes Intravenous Every 8 hours 02/23/20 2125 02/23/20 2126   02/23/20 2130  meropenem (MERREM) 1 g in sodium chloride 0.9 % 100 mL IVPB  Status:  Discontinued        1 g 200 mL/hr over 30 Minutes Intravenous  Every 8 hours 02/23/20 2126 02/26/20 1414       Objective   Vitals:   02/25/20 2339 02/26/20 0333 02/26/20 0736 02/26/20 1237  BP: 102/68 105/63 111/75 110/79  Pulse: 93 78 87 95  Resp: 14 18 16 20   Temp: 98.7 F (37.1 C) 98.6 F (37 C) 99.4 F (37.4 C) 99.7 F (37.6 C)  TempSrc: Oral Oral Oral Oral  SpO2: 98% 98% 97% 94%  Height:        SpO2: 94 % O2 Flow Rate (L/min): 2 L/min  Wt Readings from Last 3 Encounters:  02/23/20 64.4 kg  10/30/19 59 kg  10/11/19 63.5 kg     Intake/Output Summary (Last 24 hours) at 02/26/2020 1458 Last data filed at 02/26/2020 1032 Gross per 24 hour  Intake 3170.87 ml  Output 2450 ml  Net 720.87 ml    Physical Exam:     Awake Alert, Oriented X 3, in discomfort but no acute distress no new F.N deficits,  [place].AT, Normal respiratory effort on room air RRR,No Gallops,Rubs or new Murmurs, Decreased bowel sounds, abdomen slightly distended, guarding present, no rigidity, no rebound tenderness    I have personally reviewed the following:   Data Reviewed:  CBC Recent Labs  Lab 02/22/20 2239 02/23/20 2019 02/24/20 0310 02/24/20 0849 02/26/20 0154  WBC 18.2* 17.4* 14.4*  --  10.9*  HGB 11.9* 12.3 11.0* 15.0 9.8*  HCT 35.9* 37.2 32.7* 44.0 30.6*  PLT 244 203 176  --  177  MCV 86.9 88.4 90.1  --  91.3  MCH 28.8 29.2 30.3  --  29.3  MCHC 33.1 33.1 33.6  --  32.0  RDW 15.2 13.3 13.7  --  14.1  LYMPHSABS  --   --   --   --  1.6  MONOABS  --   --   --   --  0.6  EOSABS  --   --   --   --  0.3  BASOSABS  --   --   --   --  0.0    Chemistries  Recent Labs  Lab 02/23/20 0000 02/23/20 0212 02/23/20 2019 02/24/20 0310 02/24/20 0725 02/24/20 0849 02/24/20 1954 02/25/20 0354 02/25/20 1506 02/26/20 0154  NA 124* 133*  --  133* 133* 137 134* 139 137 137  K 4.9 4.7  --  2.8* 2.7* 2.8* 2.9* 3.1* 3.6 4.0  CL 94* 92*  --  101 99 98 105 107 107 110  CO2 19* 19*  --  23 24  --  23 25 23  20*  GLUCOSE 257* 254*  --  188* 186* 145*  164* 137* 147* 147*  BUN 7 7  --  <5* <5* <3* <5* <5* <5* <5*  CREATININE 0.47 0.42*   < > <0.30* <0.30* <0.20* 0.31* 0.30* <0.30* <0.30*  CALCIUM 8.6* 8.7*  --  7.9* 7.9*  --  7.5* 7.7* 7.7* 7.6*  MG  --   --   --  1.6*  --   --  1.8  --   --   --   AST UNABLE TO REPORT DUE  TO HEMOLYSIS 43*  --  19  --   --   --   --   --  13*  ALT UNABLE TO REPORT DUE TO HEMOLYSIS 25  --  17  --   --   --   --   --  10  ALKPHOS 60 59  --  51  --   --   --   --   --  46  BILITOT 3.4* 3.4*  --  0.7  --   --   --   --   --  0.7   < > = values in this interval not displayed.   ------------------------------------------------------------------------------------------------------------------ Recent Labs    02/25/20 0354 02/26/20 0154  TRIG 358* 247*    Lab Results  Component Value Date   HGBA1C 10.8 (H) 02/23/2020   ------------------------------------------------------------------------------------------------------------------ No results for input(s): TSH, T4TOTAL, T3FREE, THYROIDAB in the last 72 hours.  Invalid input(s): FREET3 ------------------------------------------------------------------------------------------------------------------ No results for input(s): VITAMINB12, FOLATE, FERRITIN, TIBC, IRON, RETICCTPCT in the last 72 hours.  Coagulation profile Recent Labs  Lab 02/23/20 2129  INR 1.1    No results for input(s): DDIMER in the last 72 hours.  Cardiac Enzymes No results for input(s): CKMB, TROPONINI, MYOGLOBIN in the last 168 hours.  Invalid input(s): CK ------------------------------------------------------------------------------------------------------------------    Component Value Date/Time   BNP 11.8 10/12/2019 0300    Micro Results Recent Results (from the past 240 hour(s))  SARS CORONAVIRUS 2 (TAT 6-24 HRS) Nasopharyngeal Nasopharyngeal Swab     Status: None   Collection Time: 02/23/20 11:00 AM   Specimen: Nasopharyngeal Swab  Result Value Ref Range Status    SARS Coronavirus 2 NEGATIVE NEGATIVE Final    Comment: (NOTE) SARS-CoV-2 target nucleic acids are NOT DETECTED.  The SARS-CoV-2 RNA is generally detectable in upper and lower respiratory specimens during the acute phase of infection. Negative results do not preclude SARS-CoV-2 infection, do not rule out co-infections with other pathogens, and should not be used as the sole basis for treatment or other patient management decisions. Negative results must be combined with clinical observations, patient history, and epidemiological information. The expected result is Negative.  Fact Sheet for Patients: HairSlick.no  Fact Sheet for Healthcare Providers: quierodirigir.com  This test is not yet approved or cleared by the [place] FDA and  has been authorized for detection and/or diagnosis of SARS-CoV-2 by FDA under an Emergency Use Authorization (EUA). This EUA will remain  in effect (meaning this test can be used) for the duration of the COVID-19 declaration under Se ction 564(b)(1) of the Act, 21 U.S.C. section 360bbb-3(b)(1), unless the authorization is terminated or revoked sooner.  Performed at [facility], [address] [place], [place] [address]   Culture, blood (x 2)     Status: None (Preliminary result)   Collection Time: 02/23/20  9:29 PM   Specimen: BLOOD LEFT HAND  Result Value Ref Range Status   Specimen Description BLOOD LEFT HAND  Final   Special Requests   Final    BOTTLES DRAWN AEROBIC AND ANAEROBIC Blood Culture results may not be optimal due to an inadequate volume of blood received in culture bottles   Culture   Final    NO GROWTH 3 DAYS Performed at [facility], [address], [place], [place] [address]    Report Status PENDING  Incomplete  Culture, blood (x 2)     Status: None (Preliminary result)   Collection Time: 02/23/20  9:29 PM  Specimen: BLOOD  Result Value Ref Range Status    Specimen Description BLOOD LEFT ANTECUBITAL  Final   Special Requests   Final    BOTTLES DRAWN AEROBIC AND ANAEROBIC Blood Culture results may not be optimal due to an inadequate volume of blood received in culture bottles   Culture   Final    NO GROWTH 3 DAYS Performed at [facility], [place], [place], [place] [address]    Report Status PENDING  Incomplete    Radiology Reports DG Chest 2 View  Result Date: 02/22/2020 CLINICAL DATA:  Chest pain EXAM: CHEST - 2 VIEW COMPARISON:  10/11/2019 FINDINGS: The heart size and mediastinal contours are within normal limits. Both lungs are clear. The visualized skeletal structures are unremarkable. IMPRESSION: No active cardiopulmonary disease. Electronically Signed   By: Donavan Foil M.D.   On: 02/22/2020 23:36   CT ABDOMEN PELVIS W CONTRAST  Result Date: 02/23/2020 CLINICAL DATA:  45 year old female with left side abdominal pain, chest and back pain. EXAM: CT ABDOMEN AND PELVIS WITH CONTRAST TECHNIQUE: Multidetector CT imaging of the abdomen and pelvis was performed using the standard protocol following bolus administration of intravenous contrast. CONTRAST:  176mL OMNIPAQUE IOHEXOL 300 MG/ML  SOLN COMPARISON:  CT Abdomen and Pelvis 03/15/2019 and earlier. FINDINGS: Lower chest: Lower lung volumes. Atelectasis at the lung bases, and more pronounced opacity in the posterior basal segment of the left lower lobe, although the most affected parenchyma there is enhancing. No pericardial or pleural effusion. Hepatobiliary: Chronic hepatic steatosis. Negative gallbladder. No dilated bile ducts. Pancreas: Moderate to severe peripancreatic inflammation surrounding the body and tail, tracking to the gastrosplenic ligament and to the splenic flexure (series 2, image 27). No organized or drainable fluid collection. No pancreatic necrosis identified. No ductal dilatation. Spleen: Negative aside from regional inflammation. Adrenals/Urinary Tract:  Adrenal glands remain normal. Bilateral renal contrast excretion is symmetric and within normal limits. Inflammation mildly involves the left pararenal space anteriorly. No nephrolithiasis. There is subtle abnormal decreased enhancement of the left renal upper pole on coronal image 64. Otherwise renal enhancement is within normal limits. Ureters are decompressed. Urinary bladder is distended (estimated volume 600 mL), but otherwise unremarkable. Stomach/Bowel: Decompressed large bowel from the splenic flexure distally. Secondary inflammation of the splenic flexure related to the lesser sac, peripancreatic edema. More normal appearance of the transverse colon. Negative right colon. Appendix within normal limits. Negative terminal ileum. No dilated small bowel. Secondarily inflamed stomach with circumferential wall thickening sparing the antrum. The duodenum and proximal jejunum appear relatively spared. No free air. Vascular/Lymphatic: Major arterial structures in the abdomen and pelvis are patent. Portal venous system including the splenic vein appears to be patent. No lymphadenopathy. Reproductive: Surgically absent uterus. Ovaries are stable, within normal limits. Other: No pelvic free fluid. Musculoskeletal: Negative. IMPRESSION: 1. Acute Pancreatitis with confluent left upper quadrant inflammation secondarily involving the stomach, the splenic flexure of colon, and possibly also the left renal upper pole. No pancreatic necrosis, drainable fluid collection, or other complicating feature. 2. Distended urinary bladder (600 mL). 3. Chronic fatty liver disease. Electronically Signed   By: Genevie Ann M.D.   On: 02/23/2020 08:20     Time Spent in minutes  30     Desiree Hane M.D on 02/26/2020 at 2:58 PM  To page go to [email or web address] - password [facility]

## 2020-02-26 NOTE — Plan of Care (Signed)
Plan of care reviewed. Pt's been progressing. She's hemodynamically stable.  Problem: Clinical Measurements: Acute pancreatitis. Hemodialysis catheter on right subclavian for plasmapheresis.  Goal: Will remain free from infection Outcome: Progressing: on Meropenem IV antibiotics. She remains afebrile. Dressing of HD changed q shift per order. No signs of infection on HD cath.   Problem: Clinical Measurements: On 2 lpm of O2 NCL Goal: Respiratory complications will improve Outcome: Progressing: SPO2 98%, no respiratory distress noted.   Problem: Clinical Measurements: NSR on monitor, BP within normal limits. Goal: Cardiovascular complication will be avoided Outcome: Progressing   Problem: Pain Managment: Severe abdominal pain 10/10 pain scale. Goal: General experience of comfort will improve Outcome: Progressing: alternated pain med between Dilaudid and Oxycodone. Pt's able to rest and sleep tonight.   Problem: Clinical Measurements: Hypertriglyceridemia. On insulin gtt titrated with EndoTool. CBG checking q 1 hr. Goal: Diagnostic test results will improve Outcome: Progressing: follow up blood work for Triglyceride level at am.   Filiberto Pinks, RN

## 2020-02-26 NOTE — Progress Notes (Signed)
Nephrology Follow-Up Consult note   Assessment/Recommendations: Sabrina Hanna is a/an 45 y.o. 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 Was >5000 now 56 with medical management. Given her severe fetures including tachycardia we treated with plex x1 but now <1000 and improving symptoms so no further PLEX. Goal is triglycerides < 1000 so she is at goal -Given the patient's triglycerides are at goal we will sign off at this time.  No further PLEX needed unless triglycerides increase to >1000 -Further pancreatitis mgmt per primary team with insulin and fluids  Hypokalemia: 2/2 insulin use and fluids -resolved  Hyponatremia: pseudo from severe hypertriglyceridemia now normal. Serum osm normal. Now resolved with improvement in triglycerides   Recommendations conveyed to primary service.    Darnell Level [facility] 02/26/2020 8:56 AM  ___________________________________________________________  CC: Hypertriglyceridemia   Interval History/Subjective: Pain worse today. No bowel movement. Triglycerides remain low.   Medications:  Current Facility-Administered Medications  Medication Dose Route Frequency Provider Last Rate Last Admin  . 0.9 %  sodium chloride infusion   Intravenous PRN Swayze, Ava, DO 10 mL/hr at 02/25/20 2117 New Bag at 02/25/20 2117  . 0.9 %  sodium chloride infusion   Intravenous Continuous Roberto Scales D, MD 100 mL/hr at 02/26/20 0843 New Bag at 02/26/20 0843  . acetaminophen (TYLENOL) tablet 650 mg  650 mg Oral Q6H PRN Maretta Bees, MD   650 mg at 02/25/20 2353   Or  . acetaminophen (TYLENOL) suppository 650 mg  650 mg Rectal Q6H PRN Maretta Bees, MD      . atorvastatin (LIPITOR) tablet 20 mg  20 mg Oral QHS Maretta Bees, MD   20 mg at 02/25/20 2059  . Chlorhexidine Gluconate Cloth 2 % PADS 6 each  6 each Topical Daily Swayze, Ava, DO   6 each at 02/25/20 1138  . dextrose 50 % solution 0-50 mL  0-50 mL Intravenous PRN Maretta Bees, MD      . enoxaparin (LOVENOX) injection 40 mg  40 mg Subcutaneous Q24H Maretta Bees, MD   40 mg at 02/25/20 1734  . fenofibrate tablet 54 mg  54 mg Oral Daily Maretta Bees, MD   54 mg at 02/26/20 0851  . hydrALAZINE (APRESOLINE) injection 10 mg  10 mg Intravenous Q4H PRN Maretta Bees, MD      . HYDROmorphone (DILAUDID) injection 0.5-1 mg  0.5-1 mg Intravenous Q2H PRN Maretta Bees, MD   1 mg at 02/26/20 0741  . meropenem (MERREM) 1 g in sodium chloride 0.9 % 100 mL IVPB  1 g Intravenous Q8H Ghimire, Werner Lean, MD 200 mL/hr at 02/26/20 0517 1 g at 02/26/20 0517  . oxyCODONE (Oxy IR/ROXICODONE) immediate release tablet 5 mg  5 mg Oral Q4H PRN Maretta Bees, MD   5 mg at 02/26/20 0851  . pantoprazole (PROTONIX) injection 40 mg  40 mg Intravenous Q24H Maretta Bees, MD   40 mg at 02/25/20 1609      Review of Systems: 10 systems reviewed and negative except per interval history/subjective  Physical Exam: Vitals:   02/26/20 0333 02/26/20 0736  BP: 105/63 111/75  Pulse: 78 87  Resp: 18 16  Temp: 98.6 F (37 C) 99.4 F (37.4 C)  SpO2: 98% 97%   Total I/O In: -  Out: 600 [  Urine:600]  Intake/Output Summary (Last 24 hours) at 02/26/2020 0856 Last data filed at 02/26/2020 0746 Gross per 24 hour  Intake 3170.87 ml  Output 1250 ml  Net 1920.87 ml   Constitutional: well-appearing, no acute distress ENMT: ears and nose without scars or lesions, MMM CV: normal rate, no edema Respiratory: bilateral chest rise, normal work of breathing Gastrointestinal: slight distention, tender to palpation Skin: no visible lesions or rashes Psych: alert, judgement/insight appropriate, appropriate mood and affect   Test Results I personally  reviewed new and old clinical labs and radiology tests Lab Results  Component Value Date   NA 137 02/26/2020   K 4.0 02/26/2020   CL 110 02/26/2020   CO2 20 (L) 02/26/2020   BUN <5 (L) 02/26/2020   CREATININE <0.30 (L) 02/26/2020   CALCIUM 7.6 (L) 02/26/2020   ALBUMIN 3.0 (L) 02/26/2020   PHOS 2.4 (L) 02/24/2020

## 2020-02-26 NOTE — Plan of Care (Signed)
  Problem: Health Behavior/Discharge Planning: Goal: Ability to manage health-related needs will improve Outcome: Not Progressing   

## 2020-02-27 ENCOUNTER — Inpatient Hospital Stay (HOSPITAL_COMMUNITY): Payer: BLUE CROSS/BLUE SHIELD

## 2020-02-27 ENCOUNTER — Encounter (HOSPITAL_COMMUNITY): Payer: Self-pay | Admitting: Internal Medicine

## 2020-02-27 DIAGNOSIS — R1084 Generalized abdominal pain: Secondary | ICD-10-CM

## 2020-02-27 DIAGNOSIS — R109 Unspecified abdominal pain: Secondary | ICD-10-CM

## 2020-02-27 LAB — BASIC METABOLIC PANEL
Anion gap: 11 (ref 5–15)
BUN: <5 mg/dL — ABNORMAL LOW (ref 6–20)
CO2: 23 mmol/L (ref 22–32)
Calcium: 8.1 mg/dL — ABNORMAL LOW (ref 8.9–10.3)
Chloride: 100 mmol/L (ref 98–111)
Creatinine, Ser: 0.31 mg/dL — ABNORMAL LOW (ref 0.44–1.00)
GFR, Estimated: >60 mL/min (ref >60)
Glucose, Bld: 168 mg/dL — ABNORMAL HIGH (ref 70–99)
Potassium: 3.5 mmol/L (ref 3.5–5.1)
Sodium: 134 mmol/L — ABNORMAL LOW (ref 135–145)

## 2020-02-27 LAB — GLUCOSE, CAPILLARY
Glucose-Capillary: 200 mg/dL — ABNORMAL HIGH (ref 70–99)
Glucose-Capillary: 146 mg/dL — ABNORMAL HIGH (ref 70–99)
Glucose-Capillary: 158 mg/dL — ABNORMAL HIGH (ref 70–99)
Glucose-Capillary: 204 mg/dL — ABNORMAL HIGH (ref 70–99)
Glucose-Capillary: 172 mg/dL — ABNORMAL HIGH (ref 70–99)

## 2020-02-27 LAB — TRIGLYCERIDES: Triglycerides: 221 mg/dL — ABNORMAL HIGH (ref <150)

## 2020-02-27 MED ORDER — ORAL CARE MOUTH RINSE
15.0000 mL | Freq: Two times a day (BID) | OROMUCOSAL | Status: DC
  Administered 2020-02-27 – 2020-02-29 (×4): 15 mL via OROMUCOSAL

## 2020-02-27 MED ORDER — POLYETHYLENE GLYCOL 3350 17 G PO PACK
17.0000 g | PACK | Freq: Every day | ORAL | Status: DC
  Administered 2020-02-29: 17 g via ORAL
  Filled 2020-02-27 (×3): qty 1

## 2020-02-27 MED ORDER — LACTULOSE 10 GM/15ML PO SOLN
20.0000 g | Freq: Three times a day (TID) | ORAL | Status: DC
  Administered 2020-02-27 (×2): 20 g via ORAL
  Filled 2020-02-27 (×2): qty 30

## 2020-02-27 NOTE — Progress Notes (Signed)
Multiple calls for pain medicines. Patient stated that, "it helps very little". Bowel sounds faint; patient states that, "she is not passing gas". Abdomen tender to touch

## 2020-02-27 NOTE — Progress Notes (Addendum)
PROGRESS NOTE  Sabrina Hanna ZYS:[id number] DOB: Jul 18, 1975 DOA: 02/23/2020 PCP: Leotis Shames, MD  Brief History   45 year old Hispanic female-with history of DM-2, HTN, HLD-presented to [facility] with severe upper abdominal pain-found to have acute pancreatitis due to hypertriglyceridemia.  CT abdomen with changes consistent with pancreatitis-but no necrosis.  She was started on IV antibiotics with gram negative coverage.  The patient was transferred to [facility] on 02/23/2020 so that she may receive plasma exchange. Catheter was placed to allow this to happen. She is receiving IV fluids, IV insulin, and pain control.   The patient has received one plasma exchange. Her triglycerides are down to 221. Lipase is 18. Nephrology has signed off.  Consultants  . Nephrology  Procedures  . Placement of HD catheter for plasma exchange  Antibiotics   Anti-infectives (From admission, onward)   Start     Dose/Rate Route Frequency Ordered Stop   02/23/20 2200  meropenem (MERREM) 1 g in sodium chloride 0.9 % 100 mL IVPB  Status:  Discontinued        1 g 200 mL/hr over 30 Minutes Intravenous Every 8 hours 02/23/20 2125 02/23/20 2126   02/23/20 2130  meropenem (MERREM) 1 g in sodium chloride 0.9 % 100 mL IVPB  Status:  Discontinued        1 g 200 mL/hr over 30 Minutes Intravenous Every 8 hours 02/23/20 2126 02/26/20 1414     Subjective  The patient is has been asking for Dilaudid q2 hours per nursing. She states tat her abdominal pain has been severe. She has not had a BM since admission.  Objective   Vitals:  Vitals:   02/27/20 1123 02/27/20 1515  BP: 110/76 110/76  Pulse: 84 90  Resp: 18 20  Temp: 98 F (36.7 C) 98 F (36.7 C)  SpO2: 98% 97%    Exam:  Constitutional:  . The patient is awake, alert, and oriented x 3. She is moving freely about in bed. She does not appear to be in acute distress. Respiratory:  . No increased work of breathing. . No wheezes, rales, or rhonchi . No  tactile fremitus Cardiovascular:  . Regular rate and rhythm . No murmurs, ectopy, or gallups. . No lateral PMI. No thrills. Abdomen:  . Abdomen is soft, distended and diffusely tender. . No hernias, masses, or organomegaly . Hypoactive bowel sounds.  Musculoskeletal:  . No cyanosis, clubbing, or edema Skin:  . No rashes, lesions, ulcers . palpation of skin: no induration or nodules Neurologic:  . CN 2-12 intact . Sensation all 4 extremities intact Psychiatric:  . Mental status o Mood, affect appropriate o Orientation to person, place, time  . judgment and insight appear intact  I have personally reviewed the following:   Today's Data  . Vitals, BMP, Triglycerides. Glucoses  Micro Data  . Blood cultures x 2: No growth  Imaging  . CT abdomen and pelvis  Cardiology Data  . EKG  Scheduled Meds: . atorvastatin  20 mg Oral QHS  . Chlorhexidine Gluconate Cloth  6 each Topical Daily  . enoxaparin (LOVENOX) injection  40 mg Subcutaneous Q24H  . fenofibrate  54 mg Oral Daily  . insulin aspart  0-9 Units Subcutaneous TID WC  . lactulose  20 g Oral TID  . pantoprazole (PROTONIX) IV  40 mg Intravenous Q24H   Continuous Infusions: . sodium chloride 10 mL/hr at 02/25/20 2117    Active Problems:   Pancreatitis   LOS: 4 days   A &  P  Acute pancreatitis due to hypertriglyceridemia: Continue n.p.o. status-continue IV insulin infusion-IV fluids, as needed narcotics and antiemetics.  Nephrology has already consulted-with plans for HD catheter placement to initiate plasmapheresis starting tomorrow.  Watch closely-for signs of necrosis/instability.   Pancreatitis is secondary to hypertriglyceridemia. She has been advanced from NPO to clear liquids, although she states that this made her abdominal pain worse yesterday. The patient has received one plasma exchange. Her triglycerides are down to 221. Lipase is 18. Nephrology has signed off. Dilaudid has been discontinued. Advance diet  as tolerated.  Constipation: Pt was given lactulose. She has had 2 BM's today.  Sepsis with hypotension, tachycardia, leukocytosis due to pancreatitis. Resolved.  IV fluid bolus. No lactic acidosis.  DM II: Hold all oral hypoglycemic agents. The patient is receiving IV insulin for pancreatitis related to triglyceridemia.  Has mild elevated anion gap acidosis which is due to starvation ketoacidosis from vomiting/n.p.o. status. Iv insulin has been discontinued. Glucoses are controlled with SSI and FSBS. In the last 24 hours her glucoses have been  has een 138 - 204.Marland Kitchen  HLD: Continue statin/fenofibrate.  Hypokalemia: Supplement and monitor.   Hypomagnesemia: Supplement and monitor.  I have seen and examined this patient myself. I have spent 36 minutes in her evaluation and care.  Curtina Grills, DO [facility] Direct contact: see [email or web address]  7PM-7AM contact night coverage as above 02/27/2020, 7:06 PM  LOS: 1 day

## 2020-02-28 ENCOUNTER — Inpatient Hospital Stay (HOSPITAL_COMMUNITY): Payer: BLUE CROSS/BLUE SHIELD

## 2020-02-28 DIAGNOSIS — K858 Other acute pancreatitis without necrosis or infection: Secondary | ICD-10-CM | POA: Diagnosis not present

## 2020-02-28 DIAGNOSIS — R52 Pain, unspecified: Secondary | ICD-10-CM | POA: Diagnosis not present

## 2020-02-28 DIAGNOSIS — E781 Pure hyperglyceridemia: Secondary | ICD-10-CM | POA: Diagnosis not present

## 2020-02-28 LAB — GLUCOSE, CAPILLARY
Glucose-Capillary: 125 mg/dL — ABNORMAL HIGH (ref 70–99)
Glucose-Capillary: 165 mg/dL — ABNORMAL HIGH (ref 70–99)
Glucose-Capillary: 272 mg/dL — ABNORMAL HIGH (ref 70–99)
Glucose-Capillary: 310 mg/dL — ABNORMAL HIGH (ref 70–99)

## 2020-02-28 LAB — BASIC METABOLIC PANEL
Anion gap: 10 (ref 5–15)
BUN: <5 mg/dL — ABNORMAL LOW (ref 6–20)
CO2: 25 mmol/L (ref 22–32)
Calcium: 8.5 mg/dL — ABNORMAL LOW (ref 8.9–10.3)
Chloride: 98 mmol/L (ref 98–111)
Creatinine, Ser: <0.3 mg/dL — ABNORMAL LOW (ref 0.44–1.00)
Glucose, Bld: 146 mg/dL — ABNORMAL HIGH (ref 70–99)
Potassium: 3.8 mmol/L (ref 3.5–5.1)
Sodium: 133 mmol/L — ABNORMAL LOW (ref 135–145)

## 2020-02-28 LAB — CULTURE, BLOOD (ROUTINE X 2)
Culture: NO GROWTH
Culture: NO GROWTH

## 2020-02-28 LAB — TRIGLYCERIDES: Triglycerides: 217 mg/dL — ABNORMAL HIGH (ref <150)

## 2020-02-28 MED ORDER — INSULIN ASPART 100 UNIT/ML ~~LOC~~ SOLN
3.0000 [IU] | Freq: Once | SUBCUTANEOUS | Status: AC
  Administered 2020-02-28: 3 [IU] via SUBCUTANEOUS

## 2020-02-28 MED ORDER — OXYCODONE HCL 5 MG PO TABS
5.0000 mg | ORAL_TABLET | ORAL | Status: DC | PRN
  Administered 2020-02-28 – 2020-02-29 (×6): 5 mg via ORAL
  Filled 2020-02-28 (×6): qty 1

## 2020-02-28 NOTE — Progress Notes (Signed)
PROGRESS NOTE  Sabrina Hanna OFB:[id number] DOB: Jul 01, 1975 DOA: 02/23/2020 PCP: Leotis Shames, MD  Brief History   45 year old Hispanic female-with history of DM-2, HTN, HLD-presented to [facility] with severe upper abdominal pain-found to have acute pancreatitis due to hypertriglyceridemia.  CT abdomen with changes consistent with pancreatitis-but no necrosis.  She was started on IV antibiotics with gram negative coverage.  The patient was transferred to [facility] on 02/23/2020 so that she may receive plasma exchange. Catheter was placed to allow this to happen. She is receiving IV fluids, IV insulin, and pain control.   The patient has received one plasma exchange. Her triglycerides are down to 221. Lipase is 18. Nephrology has signed off.  Consultants  . Nephrology  Procedures  . Placement of HD catheter for plasma exchange  Antibiotics   Anti-infectives (From admission, onward)   Start     Dose/Rate Route Frequency Ordered Stop   02/23/20 2200  meropenem (MERREM) 1 g in sodium chloride 0.9 % 100 mL IVPB  Status:  Discontinued        1 g 200 mL/hr over 30 Minutes Intravenous Every 8 hours 02/23/20 2125 02/23/20 2126   02/23/20 2130  meropenem (MERREM) 1 g in sodium chloride 0.9 % 100 mL IVPB  Status:  Discontinued        1 g 200 mL/hr over 30 Minutes Intravenous Every 8 hours 02/23/20 2126 02/26/20 1414     Subjective  The patient continues to complain of severe abdominal pain. She did have 2 BM's yesterday. She is now complaining of left flank pain.  Objective   Vitals:  Vitals:   02/28/20 1050 02/28/20 1621  BP: 108/73 115/79  Pulse: 76   Resp: 19 20  Temp: 98.1 F (36.7 C) 98.6 F (37 C)  SpO2: 93% 94%    Exam:  Constitutional:  . The patient is awake, alert, and oriented x 3. She is moving freely about in bed. She does not appear to be in acute distress. Respiratory:  . No increased work of breathing. . No wheezes, rales, or rhonchi . No tactile  fremitus Cardiovascular:  . Regular rate and rhythm . No murmurs, ectopy, or gallups. . No lateral PMI. No thrills. Abdomen:  . Abdomen is soft, less distnded. She is now tender in her left upper quadrant and flanl. . No hernias, masses, or organomegaly . Normoactive bowel sounds.  Musculoskeletal:  . No cyanosis, clubbing, or edema Skin:  . No rashes, lesions, ulcers . palpation of skin: no induration or nodules Neurologic:  . CN 2-12 intact . Sensation all 4 extremities intact Psychiatric:  . Mental status o Mood, affect appropriate o Orientation to person, place, time  . judgment and insight appear intact  I have personally reviewed the following:   Today's Data  . Vitals, BMP, Triglycerides. Glucoses  Micro Data  . Blood cultures x 2: No growth  Imaging  . CT abdomen and pelvis . Renal ultrasound. Negative. . CT renal: pending.  Cardiology Data  . EKG  Scheduled Meds: . atorvastatin  20 mg Oral QHS  . Chlorhexidine Gluconate Cloth  6 each Topical Daily  . enoxaparin (LOVENOX) injection  40 mg Subcutaneous Q24H  . fenofibrate  54 mg Oral Daily  . insulin aspart  0-9 Units Subcutaneous TID WC  . mouth rinse  15 mL Mouth Rinse BID  . pantoprazole (PROTONIX) IV  40 mg Intravenous Q24H  . polyethylene glycol  17 g Oral Daily   Continuous Infusions: . sodium chloride  10 mL/hr at 02/25/20 2117    Active Problems:   Pancreatitis   LOS: 5 days   A & P  Acute pancreatitis due to hypertriglyceridemia: Continue n.p.o. status-continue IV insulin infusion-IV fluids, as needed narcotics and antiemetics.  Nephrology has already consulted-with plans for HD catheter placement to initiate plasmapheresis starting tomorrow.  Watch closely-for signs of necrosis/instability.   Pancreatitis is secondary to hypertriglyceridemia. She has been advanced from NPO to clear liquids, although she states that this made her abdominal pain worse yesterday. The patient has received one  plasma exchange. Her triglycerides are down to 221. Lipase is 18. Nephrology has signed off. Dilaudid has been discontinued.   Left flank pain. The patient is now complaining of left upper quadrant and left flank pain. Renal ultrasound was negatigve. Renal CT is pending.  Constipation: Pt was given lactulose. She has had 2 BM's on 02/27/2020.  Sepsis with hypotension, tachycardia, leukocytosis due to pancreatitis. Resolved.  IV fluid bolus. No lactic acidosis.  DM II: Hold all oral hypoglycemic agents. The patient is receiving IV insulin for pancreatitis related to triglyceridemia.  Has mild elevated anion gap acidosis which is due to starvation ketoacidosis from vomiting/n.p.o. status. Iv insulin has been discontinued. Glucoses are controlled with SSI and FSBS. In the last 24 hours her glucoses have been  has een 125 - 272.  HLD: Continue statin/fenofibrate.  Hypokalemia: Resolve. Monitor.   Hypomagnesemia: Resolved. Monitor.  I have seen and examined this patient myself. I have spent 32 minutes in her evaluation and care.  Uri Turnbough, DO [facility] Direct contact: see [email or web address]  7PM-7AM contact night coverage as above 02/28/2020, 5:47 PM  LOS: 1 day

## 2020-02-28 NOTE — Progress Notes (Signed)
Inpatient Diabetes Program Recommendations  AACE/ADA: New Consensus Statement on Inpatient Glycemic Control (2015)  Target Ranges:  Prepandial:   less than 140 mg/dL      Peak postprandial:   less than 180 mg/dL (1-2 hours)      Critically ill patients:  140 - 180 mg/dL   Lab Results  Component Value Date   GLUCAP 165 (H) 02/28/2020   HGBA1C 10.8 (H) 02/23/2020    Review of Glycemic Control Results for [name], [name] (MRN [id number]) as of 02/28/2020 14:57  Ref. Range 02/27/2020 11:21 02/27/2020 16:13 02/27/2020 21:11 02/28/2020 06:08 02/28/2020 10:54  Glucose-Capillary Latest Ref Range: 70 - 99 mg/dL 948 (H) 546 (H) 270 (H) 125 (H) 165 (H)   Diabetes history:  DM2 Outpatient Diabetes medications:  Lantus 16 units QHS Novolog 5-15 units TID Metformin 1000 mg BID Current orders for Inpatient glycemic control:  Novolog 0-9 units TID    Note:  Spoke with patient at bedside.  Reviewed patient's current A1c of 10.8 % (average blood sugar of 263 mg/dL). Explained what a A1c is and what it measures. Also reviewed goal A1c with patient, importance of good glucose control @ home, and blood sugar goals.  She states she has a lot of stress lately and her husband has been sick.  She states normally her CBG's are 140-160 mg/dL.  She has not seen her PCP since the pandemic.  Encouraged her to follow up with PCP vis tele-visit if she is not comfortable going in-person.  She does not drink any beverages with sugar and drinks mostly water.  She takes above DM medications as prescribed and denies difficulty obtaining medications.  Encouraged [employer].    Will continue to follow while inpatient.  Thank you, Dulce Sellar, RN, BSN Diabetes Coordinator Inpatient Diabetes Program [phone] (team pager from 8a-5p)

## 2020-02-29 DIAGNOSIS — E1100 Type 2 diabetes mellitus with hyperosmolarity without nonketotic hyperglycemic-hyperosmolar coma (NKHHC): Secondary | ICD-10-CM

## 2020-02-29 DIAGNOSIS — M7918 Myalgia, other site: Secondary | ICD-10-CM

## 2020-02-29 DIAGNOSIS — E1165 Type 2 diabetes mellitus with hyperglycemia: Secondary | ICD-10-CM

## 2020-02-29 LAB — GLUCOSE, CAPILLARY: Glucose-Capillary: 222 mg/dL — ABNORMAL HIGH (ref 70–99)

## 2020-02-29 MED ORDER — FENOFIBRATE 54 MG PO TABS: 54.0000 mg | ORAL_TABLET | Freq: Every day | ORAL | 0 refills | Status: DC

## 2020-02-29 MED ORDER — POLYETHYLENE GLYCOL 3350 17 G PO PACK: 17.0000 g | PACK | Freq: Every day | ORAL | 0 refills | Status: DC

## 2020-02-29 MED ORDER — OXYCODONE HCL 5 MG PO TABS: 5.0000 mg | ORAL_TABLET | ORAL | 0 refills | Status: DC | PRN

## 2020-02-29 MED ORDER — PANTOPRAZOLE SODIUM 40 MG PO TBEC: 40.0000 mg | DELAYED_RELEASE_TABLET | Freq: Every day | ORAL | 1 refills | Status: DC

## 2020-02-29 NOTE — Progress Notes (Signed)
Received discharge orders from Dr. Gerri Lins.  Pt states that her left flank pain is unchanged from admission.  Dr. Gerri Lins making rounds on unit and notified of pt and primary RNs concerns about pt's pain control.  MD feels strongly that pain is musculoskeletal, as all other labs/scans are unremarkable and pain is not epigastric.  Home pain meds ordered are oxycodone 5mg  PRN, which pt states has not been helping her in the hospital, and 10mg  helped some.  However, MD does not feel comfortable increasing narcotic dose for musculoskeletal issues.  Pt understands and is preparing for discharge.  Daughter to pick up in about an hour.

## 2020-02-29 NOTE — Progress Notes (Signed)
Inpatient Diabetes Program Recommendations  AACE/ADA: New Consensus Statement on Inpatient Glycemic Control (2015)  Target Ranges:  Prepandial:   less than 140 mg/dL      Peak postprandial:   less than 180 mg/dL (1-2 hours)      Critically ill patients:  140 - 180 mg/dL   Lab Results  Component Value Date   GLUCAP 222 (H) 02/29/2020   HGBA1C 10.8 (H) 02/23/2020    Review of Glycemic Control Results for MECHEL, HAGGARD (MRN [id number]) as of 02/29/2020 10:27  Ref. Range 02/28/2020 06:08 02/28/2020 10:54 02/28/2020 16:18 02/28/2020 21:15 02/29/2020 06:06  Glucose-Capillary Latest Ref Range: 70 - 99 mg/dL 379 (H) 024 (H) 097 (H) 310 (H) 222 (H)   Diabetes history:  DM2 Outpatient Diabetes medications:  Lantus 16 units daily Novolog 5-15 units TID Metformin 1000 mg daily Current orders for Inpatient glycemic control:  Novolog 0-9 units TID  Inpatient Diabetes Program Recommendations:     CBG's elevated now that patient is eating. Please consider Lantus 13 units daily (80% home dose) & Novolog 0-15 units TID.  Will continue to follow while inpatient.  Thank you, Dulce Sellar, RN, BSN Diabetes Coordinator Inpatient Diabetes Program [phone] (team pager from 8a-5p)

## 2020-03-07 NOTE — Discharge Summary (Addendum)
Physician Discharge Summary  Sabrina Hanna BXU:[id number] DOB: September 08, 1975 DOA: 02/23/2020  PCP: Leotis Shames, MD  Admit date: 02/23/2020 Discharge date: 03/07/2020  Recommendations for Outpatient Follow-up:  1. Discharge to home 2. Follow up with PCP in 7-10 days. 3. Patient should have hepatic panel and lipid panel drawn on that date and reported to PCP. 4. Patient should check blood sugar twice daily. Once in the morning before breakfast and once in the evening before dinner. She should record these blood sugars and take them with her to visit with PCP.  Discharge Diagnoses: Principal diagnosis is #1 1. Acute pancreatitis due to hypertriglyceridemia 2. Left flank pain - musculoskeletal 3. Constipation 4. Septic shock with hypotension, tachycardia, leukocytosis due to pancreatitis. 5. Hyperlipidemia 6. Hypertriglyceridemia 7. DMII 8. Hypokalemia 9. Hypomagenesmia  Discharge Condition: Fair  Disposition: Home  Diet recommendation: Heart healthy/Modified carbohydrates  Vitals:   02/29/20 0413 02/29/20 0726  BP: 111/69 101/72  Pulse: 73 73  Resp: 20 20  Temp: 98.1 F (36.7 C) 98.3 F (36.8 C)  SpO2: 93% 93%    History of present illness: 45 year old Hispanic female-with history of DM-2, HTN, HLD-presented to [facility] with severe upper abdominal pain-found to have acute pancreatitis due to hypertriglyceridemia.  CT abdomen with changes consistent with pancreatitis-but no necrosis.   Hospital Course: 45 year old Hispanic female-with history of DM-2, HTN, HLD-presented to [facility] with severe upper abdominal pain-found to have acute pancreatitis due to hypertriglyceridemia. CT abdomen with changes consistent with pancreatitis-but no necrosis. She was started on IV antibiotics with gram negative coverage.  The patient was transferred to [facility] on 02/23/2020 so that she may receive plasma exchange. Catheter was placed to allow this to happen. She is receiving IV fluids, IV  insulin, and pain control.   The patient has received one plasma exchange. Her triglycerides are down to 221. Lipase is 18. Nephrology has signed off.  On 02/27/2020 and 02/28/2020 the patient continued to complain of left upper quadrant and left flank pain. Renal ultrasound was obtained to rule out renal lithiasis or obstructive uropathy. It was negative. The next day CT renal stone study was obtained. It also did not demonstrate any abnormality that would explain the patient's discomfort. CT abdomen from 02/26/2020 demonstrated inflammation of the pancreas with a large amount of fluid and inflammatory cnanges. There was no necrosis and no pseudocyst seen. X-ray of the abdomen performed on 02/27/2020 was negative except for gas thoughout the colonic confines to the rectum.  The patient's triglyceride levels had come down from greater than 5000 to 217 after treatment with insuling and one plasma exchange. Lipase levels had come down to 18. The patient was tolerating a regular diet.  She was discharged to home in fair condition on 154/2022.  Today's assessment: S: The patient is resting comfortably in bed. No new complaints. O: Vitals:  Vitals:   02/29/20 0413 02/29/20 0726  BP: 111/69 101/72  Pulse: 73 73  Resp: 20 20  Temp: 98.1 F (36.7 C) 98.3 F (36.8 C)  SpO2: 93% 93%   Constitutional:   The patient is awake, alert, and oriented x 3. She is moving freely about in bed. She does not appear to be in acute distress. Respiratory:   No increased work of breathing.  No wheezes, rales, or rhonchi  No tactile fremitus Cardiovascular:   Regular rate and rhythm  No murmurs, ectopy, or gallups.  No lateral PMI. No thrills. Abdomen:   Abdomen is soft, less distnded. She is now tender in  her left upper quadrant and flanl.  No hernias, masses, or organomegaly  Normoactive bowel sounds.  Musculoskeletal:   No cyanosis, clubbing, or edema Skin:   No rashes, lesions,  ulcers  palpation of skin: no induration or nodules Neurologic:   CN 2-12 intact  Sensation all 4 extremities intact Psychiatric:   Mental status ? Mood, affect appropriate ? Orientation to person, place, time   judgment and insight appear intact  I have personally reviewed the following:     Discharge Instructions  Discharge Instructions    Activity as tolerated - No restrictions   Complete by: As directed    Call MD for:  persistant nausea and vomiting   Complete by: As directed    Call MD for:  severe uncontrolled pain   Complete by: As directed    Call MD for:  temperature >100.4   Complete by: As directed    Diet - low sodium heart healthy   Complete by: As directed    Diet Carb Modified   Complete by: As directed    Discharge instructions   Complete by: As directed    Discharge to home Follow up with PCP in 7-10 days. Patient should have hepatic panel and lipid panel drawn on that date and reported to PCP. Patient should check blood sugar twice daily. Once in the morning before breakfast and once in the evening before dinner. She should record these blood sugars and take them with her to visit with PCP.   Increase activity slowly   Complete by: As directed    No wound care   Complete by: As directed      Allergies as of 02/29/2020      Reactions   Latex Rash   Penicillin G Rash   Penicillins Rash, Other (See Comments)   Has patient had a PCN reaction causing immediate rash, facial/tongue/throat swelling, SOB or lightheadedness with hypotension: No Has patient had a PCN reaction causing severe rash involving mucus membranes or skin necrosis: No Has patient had a PCN reaction that required hospitalization No Has patient had a PCN reaction occurring within the last 10 years: No If all of the above answers are "NO", then may proceed with Cephalosporin use.      Medication List    STOP taking these medications   esomeprazole 20 MG capsule Commonly known  as: NEXIUM   famotidine 20 MG tablet Commonly known as: PEPCID   omeprazole 20 MG capsule Commonly known as: PRILOSEC     TAKE these medications   atorvastatin 20 MG tablet Commonly known as: LIPITOR Take 20 mg by mouth at bedtime.   fenofibrate 54 MG tablet Take 1 tablet (54 mg total) by mouth daily. What changed:   medication strength  how much to take   gabapentin 100 MG capsule Commonly known as: NEURONTIN Take 100 mg by mouth 3 (three) times daily.   Lantus 100 UNIT/ML injection Generic drug: insulin glargine Inject 0.16 mLs (16 Units total) into the skin at bedtime.   metFORMIN 500 MG tablet Commonly known as: GLUCOPHAGE Take 1,000 mg by mouth 2 (two) times daily.   NovoLOG FlexPen 100 UNIT/ML FlexPen Generic drug: insulin aspart Inject 5-15 Units into the skin 3 (three) times daily with meals. Uses sliding scale   Omega-3 1000 MG Caps Take 1,000 mg by mouth in the morning and at bedtime.   oxyCODONE 5 MG immediate release tablet Commonly known as: Oxy IR/ROXICODONE Take 1 tablet (5 mg total) by mouth  every 4 (four) hours as needed for moderate pain.   pantoprazole 40 MG tablet Commonly known as: Protonix Take 1 tablet (40 mg total) by mouth daily.   polyethylene glycol 17 g packet Commonly known as: MIRALAX / GLYCOLAX Take 17 g by mouth daily.      Allergies  Allergen Reactions  . Latex Rash  . Penicillin G Rash  . Penicillins Rash and Other (See Comments)    Has patient had a PCN reaction causing immediate rash, facial/tongue/throat swelling, SOB or lightheadedness with hypotension: No Has patient had a PCN reaction causing severe rash involving mucus membranes or skin necrosis: No Has patient had a PCN reaction that required hospitalization No Has patient had a PCN reaction occurring within the last 10 years: No If all of the above answers are "NO", then may proceed with Cephalosporin use.     The results of significant diagnostics from  this hospitalization (including imaging, microbiology, ancillary and laboratory) are listed below for reference.    Significant Diagnostic Studies: DG Chest 2 View  Result Date: 02/22/2020 CLINICAL DATA:  Chest pain EXAM: CHEST - 2 VIEW COMPARISON:  10/11/2019 FINDINGS: The heart size and mediastinal contours are within normal limits. Both lungs are clear. The visualized skeletal structures are unremarkable. IMPRESSION: No active cardiopulmonary disease. Electronically Signed   By: Jasmine PangKim  Fujinaga M.D.   On: 02/22/2020 23:36   DG Abd 1 View  Result Date: 02/27/2020 CLINICAL DATA:  LEFT upper quadrant pain, history hypertension, hypertriglyceridemia, history of pancreatitis, had COVID-19 in 2021, type II diabetes mellitus EXAM: ABDOMEN - 1 VIEW COMPARISON:  CT abdomen and pelvis 02/26/2020 FINDINGS: Normal bowel gas pattern. No bowel dilatation or bowel wall thickening. Gas present to rectum. Osseous structures unremarkable. No urinary tract calcification. IMPRESSION: No acute abnormalities. Electronically Signed   By: Ulyses SouthwardMark  Boles M.D.   On: 02/27/2020 13:13   CT ABDOMEN PELVIS W CONTRAST  Result Date: 02/26/2020 CLINICAL DATA:  Abdominal pain, pancreatitis. EXAM: CT ABDOMEN AND PELVIS WITH CONTRAST TECHNIQUE: Multidetector CT imaging of the abdomen and pelvis was performed using the standard protocol following bolus administration of intravenous contrast. CONTRAST:  100mL OMNIPAQUE IOHEXOL 300 MG/ML  SOLN COMPARISON:  February 23, 2020. FINDINGS: Lower chest: Mild bilateral posterior basilar subsegmental atelectasis is noted with small pleural effusions. Hepatobiliary: No gallstones are noted. Distended gallbladder is noted. No biliary dilatation is noted. Hepatic steatosis is noted. Pancreas: There is again noted a large amount of fluid and inflammatory changes involving the pancreatic body and tail concerning for acute pancreatitis. This fluid is seen to extend between the stomach and spleen and into the  left pericolic gutter. No definite necrosis is seen at this time. No ductal dilatation is noted. No definite well-defined pseudocyst is noted. Spleen: Normal in size without focal abnormality. Adrenals/Urinary Tract: Adrenal glands are unremarkable. Kidneys are normal, without renal calculi, focal lesion, or hydronephrosis. Bladder is unremarkable. Stomach/Bowel: Stomach is within normal limits. Appendix appears normal. No evidence of bowel wall thickening, distention, or inflammatory changes. Vascular/Lymphatic: No significant vascular findings are present. No enlarged abdominal or pelvic lymph nodes. Reproductive: Status post hysterectomy and reportedly right salpingo-oophorectomy. No significant left adnexal abnormality is noted. Other: Small amount of free fluid is noted in the dependent portion of the urinary bladder. No hernia is noted. Musculoskeletal: No acute or significant osseous findings. IMPRESSION: 1. Large amount of fluid and inflammatory changes are noted involving the pancreatic body and tail concerning for acute pancreatitis. This fluid is seen to  extend between the stomach and spleen and into the left pericolic gutter. No definite necrosis is seen at this time. No definite well-defined pseudocyst is noted. 2. Hepatic steatosis. 3. Distended gallbladder is noted without gallstones. 4. Mild bilateral posterior basilar subsegmental atelectasis is noted with small pleural effusions. Electronically Signed   By: Lupita Raider M.D.   On: 02/26/2020 15:12   CT ABDOMEN PELVIS W CONTRAST  Result Date: 02/23/2020 CLINICAL DATA:  45 year old female with left side abdominal pain, chest and back pain. EXAM: CT ABDOMEN AND PELVIS WITH CONTRAST TECHNIQUE: Multidetector CT imaging of the abdomen and pelvis was performed using the standard protocol following bolus administration of intravenous contrast. CONTRAST:  OMNIPAQUE IOHEXOL 300 MG/ML  SOLN COMPARISON:  CT Abdomen and Pelvis 03/15/2019 and  earlier. FINDINGS: Lower chest: Lower lung volumes. Atelectasis at the lung bases, and more pronounced opacity in the posterior basal segment of the left lower lobe, although the most affected parenchyma there is enhancing. No pericardial or pleural effusion. Hepatobiliary: Chronic hepatic steatosis. Negative gallbladder. No dilated bile ducts. Pancreas: Moderate to severe peripancreatic inflammation surrounding the body and tail, tracking to the gastrosplenic ligament and to the splenic flexure (series 2, image 27). No organized or drainable fluid collection. No pancreatic necrosis identified. No ductal dilatation. Spleen: Negative aside from regional inflammation. Adrenals/Urinary Tract: Adrenal glands remain normal. Bilateral renal contrast excretion is symmetric and within normal limits. Inflammation mildly involves the left pararenal space anteriorly. No nephrolithiasis. There is subtle abnormal decreased enhancement of the left renal upper pole on coronal image 64. Otherwise renal enhancement is within normal limits. Ureters are decompressed. Urinary bladder is distended (estimated volume 600 mL), but otherwise unremarkable. Stomach/Bowel: Decompressed large bowel from the splenic flexure distally. Secondary inflammation of the splenic flexure related to the lesser sac, peripancreatic edema. More normal appearance of the transverse colon. Negative right colon. Appendix within normal limits. Negative terminal ileum. No dilated small bowel. Secondarily inflamed stomach with circumferential wall thickening sparing the antrum. The duodenum and proximal jejunum appear relatively spared. No free air. Vascular/Lymphatic: Major arterial structures in the abdomen and pelvis are patent. Portal venous system including the splenic vein appears to be patent. No lymphadenopathy. Reproductive: Surgically absent uterus. Ovaries are stable, within normal limits. Other: No pelvic free fluid. Musculoskeletal: Negative.  IMPRESSION: 1. Acute Pancreatitis with confluent left upper quadrant inflammation secondarily involving the stomach, the splenic flexure of colon, and possibly also the left renal upper pole. No pancreatic necrosis, drainable fluid collection, or other complicating feature. 2. Distended urinary bladder (600 mL). 3. Chronic fatty liver disease. Electronically Signed   By: Odessa Fleming M.D.   On: 02/23/2020 08:20   US Renal  Result Date: 02/28/2020 CLINICAL DATA:  Acute left flank pain. EXAM: RENAL / URINARY TRACT ULTRASOUND COMPLETE COMPARISON:  February 26, 2020. FINDINGS: Right Kidney: Renal measurements: 13.8 x 6.4 x 4.4 cm = volume: 202 mL. Echogenicity within normal limits. No mass or hydronephrosis visualized. Left Kidney: Renal measurements: 12.3 x 7.4 x 6.7 cm = volume: 316 mL. Echogenicity within normal limits. No mass or hydronephrosis visualized. Bladder: Appears normal for degree of bladder distention. Other: None. IMPRESSION: Normal renal ultrasound. Electronically Signed   By: Lupita Raider M.D.   On: 02/28/2020 13:37   CT RENAL STONE STUDY  Result Date: 02/28/2020 CLINICAL DATA:  Flank pain. Severe upper abdominal pain history of pancreatitis. EXAM: CT ABDOMEN AND PELVIS WITHOUT CONTRAST TECHNIQUE: Multidetector CT imaging of the abdomen and pelvis  was performed following the standard protocol without IV contrast. COMPARISON:  February 26, 2020 FINDINGS: Lower chest: There are trace bilateral pleural effusions with adjacent atelectasis.The heart size is normal. Hepatobiliary: There is decreased hepatic attenuation suggestive of hepatic steatosis. Normal gallbladder.There is no biliary ductal dilation. Pancreas: Again noted are inflammatory changes about the pancreas, specifically the pancreatic tail. These changes appear grossly similar to prior study given differences in imaging technique. Spleen: Unremarkable. Adrenals/Urinary Tract: --Adrenal glands: Unremarkable. --Right kidney/ureter: No  hydronephrosis or radiopaque kidney stones. --Left kidney/ureter: No hydronephrosis or radiopaque kidney stones. --Urinary bladder: Unremarkable. Stomach/Bowel: --Stomach/Duodenum: No hiatal hernia or other gastric abnormality. Normal duodenal course and caliber. --Small bowel: Unremarkable. --Colon: Unremarkable. --Appendix: Normal. Vascular/Lymphatic: Normal course and caliber of the major abdominal vessels. --No retroperitoneal lymphadenopathy. --No mesenteric lymphadenopathy. --No pelvic or inguinal lymphadenopathy. Reproductive: Status post hysterectomy. No adnexal mass. Other: No ascites or free air. The abdominal wall is normal. Musculoskeletal. No acute displaced fractures. IMPRESSION: 1. Again noted are inflammatory changes about the pancreas, specifically the pancreatic tail, consistent with acute pancreatitis. These changes appear grossly similar to prior study given differences in imaging technique. 2. Trace bilateral pleural effusions with adjacent atelectasis. 3. Hepatic steatosis. Electronically Signed   By: Katherine Mantle M.D.   On: 02/28/2020 19:40    Microbiology: No results found for this or any previous visit (from the past 240 hour(s)).   Labs: Basic Metabolic Panel: No results for input(s): NA, K, CL, CO2, GLUCOSE, BUN, CREATININE, CALCIUM, MG, PHOS in the last 168 hours. Liver Function Tests: No results for input(s): AST, ALT, ALKPHOS, BILITOT, PROT, ALBUMIN in the last 168 hours. No results for input(s): LIPASE, AMYLASE in the last 168 hours. No results for input(s): AMMONIA in the last 168 hours. CBC: No results for input(s): WBC, NEUTROABS, HGB, HCT, MCV, PLT in the last 168 hours. Cardiac Enzymes: No results for input(s): CKTOTAL, CKMB, CKMBINDEX, TROPONINI in the last 168 hours. BNP: BNP (last 3 results) Recent Labs    10/12/19 0300  BNP 11.8    ProBNP (last 3 results) No results for input(s): PROBNP in the last 8760 hours.  CBG: No results for input(s):  GLUCAP in the last 168 hours.  Active Problems:   Pancreatitis   Time coordinating discharge: 38 minutes  Signed:        Tyeasha Ebbs, DO [facility]  03/07/2020, 3:05 PM

## 2020-08-27 ENCOUNTER — Emergency Department
Admission: EM | Admit: 2020-08-27 | Discharge: 2020-08-27 | Disposition: A | Discharge status: Other Acute Inpatient Hospital | Payer: BLUE CROSS/BLUE SHIELD | Attending: Emergency Medicine | Admitting: Emergency Medicine

## 2020-08-27 ENCOUNTER — Other Ambulatory Visit: Payer: Self-pay

## 2020-08-27 ENCOUNTER — Ambulatory Visit (HOSPITAL_COMMUNITY)
Admission: AD | Admit: 2020-08-27 | Discharge: 2020-08-27 | Disposition: A | Discharge status: Home / Self Care | Payer: BLUE CROSS/BLUE SHIELD | Source: Other Acute Inpatient Hospital | Attending: Emergency Medicine | Admitting: Emergency Medicine

## 2020-08-27 ENCOUNTER — Emergency Department: Payer: BLUE CROSS/BLUE SHIELD

## 2020-08-27 ENCOUNTER — Inpatient Hospital Stay
Admission: AD | Admit: 2020-08-27 | Payer: BLUE CROSS/BLUE SHIELD | Source: Other Acute Inpatient Hospital | Admitting: Internal Medicine

## 2020-08-27 DIAGNOSIS — I10 Essential (primary) hypertension: Secondary | ICD-10-CM | POA: Insufficient documentation

## 2020-08-27 DIAGNOSIS — E1110 Type 2 diabetes mellitus with ketoacidosis without coma: Secondary | ICD-10-CM | POA: Diagnosis not present

## 2020-08-27 DIAGNOSIS — Z8616 Personal history of COVID-19: Secondary | ICD-10-CM | POA: Insufficient documentation

## 2020-08-27 DIAGNOSIS — Z91040 Latex allergy status: Secondary | ICD-10-CM | POA: Diagnosis not present

## 2020-08-27 DIAGNOSIS — Z794 Long term (current) use of insulin: Secondary | ICD-10-CM | POA: Insufficient documentation

## 2020-08-27 DIAGNOSIS — Z7984 Long term (current) use of oral hypoglycemic drugs: Secondary | ICD-10-CM | POA: Insufficient documentation

## 2020-08-27 DIAGNOSIS — K858 Other acute pancreatitis without necrosis or infection: Secondary | ICD-10-CM | POA: Diagnosis not present

## 2020-08-27 DIAGNOSIS — K859 Acute pancreatitis without necrosis or infection, unspecified: Secondary | ICD-10-CM | POA: Insufficient documentation

## 2020-08-27 DIAGNOSIS — E781 Pure hyperglyceridemia: Secondary | ICD-10-CM | POA: Insufficient documentation

## 2020-08-27 DIAGNOSIS — Z20822 Contact with and (suspected) exposure to covid-19: Secondary | ICD-10-CM | POA: Insufficient documentation

## 2020-08-27 DIAGNOSIS — R1033 Periumbilical pain: Secondary | ICD-10-CM | POA: Diagnosis present

## 2020-08-27 LAB — COMPREHENSIVE METABOLIC PANEL
ALT: 35 U/L (ref 0–44)
AST: 32 U/L (ref 15–41)
Albumin: 4 g/dL (ref 3.5–5.0)
Alkaline Phosphatase: 54 U/L (ref 38–126)
Anion gap: 10 (ref 5–15)
BUN: 15 mg/dL (ref 6–20)
CO2: 22 mmol/L (ref 22–32)
Calcium: 8.8 mg/dL — ABNORMAL LOW (ref 8.9–10.3)
Chloride: 103 mmol/L (ref 98–111)
Creatinine, Ser: 0.34 mg/dL — ABNORMAL LOW (ref 0.44–1.00)
GFR, Estimated: >60 mL/min (ref >60)
Glucose, Bld: 193 mg/dL — ABNORMAL HIGH (ref 70–99)
Potassium: 3.2 mmol/L — ABNORMAL LOW (ref 3.5–5.1)
Sodium: 135 mmol/L (ref 135–145)
Total Bilirubin: 0.4 mg/dL (ref 0.3–1.2)
Total Protein: 7.4 g/dL (ref 6.5–8.1)

## 2020-08-27 LAB — URINALYSIS, COMPLETE (UACMP) WITH MICROSCOPIC
Bacteria, UA: NONE SEEN
Bilirubin Urine: NEGATIVE
Glucose, UA: >=500 mg/dL — AB
Hgb urine dipstick: NEGATIVE
Ketones, ur: 80 mg/dL — AB
Leukocytes,Ua: NEGATIVE
Nitrite: NEGATIVE
Protein, ur: 30 mg/dL — AB
Specific Gravity, Urine: 1.039 — ABNORMAL HIGH (ref 1.005–1.030)
pH: 6 (ref 5.0–8.0)

## 2020-08-27 LAB — CBC
HCT: 33.1 % — ABNORMAL LOW (ref 36.0–46.0)
Hemoglobin: 11.9 g/dL — ABNORMAL LOW (ref 12.0–15.0)
MCH: 30.7 pg (ref 26.0–34.0)
MCHC: 36 g/dL (ref 30.0–36.0)
MCV: 85.3 fL (ref 80.0–100.0)
Platelets: 143 10*3/uL — ABNORMAL LOW (ref 150–400)
RBC: 3.88 MIL/uL (ref 3.87–5.11)
RDW: 12.8 % (ref 11.5–15.5)
WBC: 14.6 10*3/uL — ABNORMAL HIGH (ref 4.0–10.5)
nRBC: 0.1 % (ref 0.0–0.2)

## 2020-08-27 LAB — TRIGLYCERIDES: Triglycerides: >5000 mg/dL — ABNORMAL HIGH (ref <150)

## 2020-08-27 LAB — CBG MONITORING, ED
Glucose-Capillary: 226 mg/dL — ABNORMAL HIGH (ref 70–99)
Glucose-Capillary: 221 mg/dL — ABNORMAL HIGH (ref 70–99)
Glucose-Capillary: 236 mg/dL — ABNORMAL HIGH (ref 70–99)
Glucose-Capillary: 218 mg/dL — ABNORMAL HIGH (ref 70–99)

## 2020-08-27 LAB — LIPASE, BLOOD: Lipase: 48 U/L (ref 11–51)

## 2020-08-27 LAB — POC SARS CORONAVIRUS 2 AG -  ED: SARS Coronavirus 2 Ag: NEGATIVE

## 2020-08-27 LAB — SARS CORONAVIRUS 2 (TAT 6-24 HRS): SARS Coronavirus 2: NEGATIVE

## 2020-08-27 MED ORDER — LACTATED RINGERS IV SOLN: INTRAVENOUS | Status: DC

## 2020-08-27 MED ORDER — HYDROMORPHONE HCL 1 MG/ML IJ SOLN
1.0000 mg | Freq: Once | INTRAMUSCULAR | Status: AC
  Administered 2020-08-27: 1 mg via INTRAVENOUS
  Filled 2020-08-27: qty 1

## 2020-08-27 MED ORDER — ONDANSETRON HCL 4 MG/2ML IJ SOLN
INTRAMUSCULAR | Status: AC
  Filled 2020-08-27: qty 2

## 2020-08-27 MED ORDER — HYDROMORPHONE HCL 1 MG/ML IJ SOLN
0.5000 mg | Freq: Once | INTRAMUSCULAR | Status: AC
  Administered 2020-08-27: 0.5 mg via INTRAVENOUS
  Filled 2020-08-27: qty 1

## 2020-08-27 MED ORDER — DEXTROSE 5 % IV SOLN: INTRAVENOUS | Status: DC

## 2020-08-27 MED ORDER — INSULIN (MYXREDLIN) INFUSION FOR HYPERTRIGLYCERIDEMIA
0.1000 [IU]/kg/h | INTRAVENOUS | Status: DC
  Administered 2020-08-27: 0.1 [IU]/kg/h via INTRAVENOUS
  Filled 2020-08-27: qty 100

## 2020-08-27 MED ORDER — IOHEXOL 300 MG/ML  SOLN
100.0000 mL | Freq: Once | INTRAMUSCULAR | Status: AC | PRN
  Administered 2020-08-27: 100 mL via INTRAVENOUS

## 2020-08-27 MED ORDER — ONDANSETRON HCL 4 MG/2ML IJ SOLN
4.0000 mg | Freq: Once | INTRAMUSCULAR | Status: AC
  Administered 2020-08-27: 4 mg via INTRAVENOUS

## 2020-08-27 MED ORDER — LACTATED RINGERS IV BOLUS
1000.0000 mL | Freq: Once | INTRAVENOUS | Status: AC
  Administered 2020-08-27: 1000 mL via INTRAVENOUS

## 2020-08-27 NOTE — ED Notes (Signed)
CT SCAN  POWERSHARE  WITH  [facility] 

## 2020-08-27 NOTE — ED Notes (Signed)
[facility]  CALLED  PER  DR  Delton Prairie  MD

## 2020-08-27 NOTE — ED Notes (Signed)
[facility]  CALLED  PER  DR  Delton Prairie MD

## 2020-08-27 NOTE — ED Triage Notes (Signed)
Per pt generalized abd pain since yesterday. Pt states history of pancreatitis. Pt took 2 oxycodone 20 min pta. Pt denies fever, nausea or vomiting.

## 2020-08-27 NOTE — ED Notes (Signed)
EMTALA reviewedby this RN and all information is updated and correct.

## 2020-08-27 NOTE — ED Notes (Signed)
Pt observed vomiting in lobby at this time.

## 2020-08-27 NOTE — ED Notes (Signed)
[facility]  CALLED  TO  TRANSPORT  PT  TO  [facility]  RM  [phone]  INFORMED  KATE RN

## 2020-08-27 NOTE — ED Notes (Signed)
Pt back from CT

## 2020-08-27 NOTE — ED Provider Notes (Signed)
[facility] Emergency Department Provider Note ____________________________________________   Event Date/Time   First MD Initiated Contact with Patient 08/27/20 0827     (approximate)  I have reviewed the triage vital signs and the nursing notes.  HISTORY  Chief Complaint Abdominal Pain   HPI Sabrina Hanna is a 45 y.o. femalewho presents to the ED for evaluation of abdominal pain.  Chart review indicates history of HTN, HLD, DM and an episode of acute pancreatitis about 6 months ago, attributed to hypertriglyceridemia.   Patient presents to the ED from home for evaluation of severe abdominal pain over the past 12-24 hours.  She reports severe periumbilical and epigastric abdominal pain, sharp in nature, and reminiscent of her episode of acute pancreatitis that occurred about 6 months ago.  She reports a couple episodes of nonbloody nonbilious emesis.  Denies diarrhea, dysuria, syncopal episodes, chest pain or fever.  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 Authorizing Provider  atorvastatin (LIPITOR) 20 MG tablet Take 20 mg by  mouth at bedtime. 06/26/17   [provider]  fenofibrate 54 MG tablet Take 1 tablet (54 mg total) by mouth daily. 03/01/20   Swayze, Ava, DO  gabapentin (NEURONTIN) 100 MG capsule Take 100 mg by mouth 3 (three) times daily. 08/24/19   [provider]  insulin aspart (NOVOLOG FLEXPEN) 100 UNIT/ML FlexPen Inject 5-15 Units into the skin 3 (three) times daily with meals. Uses sliding scale 10/16/19   Meredeth Ide, MD  LANTUS 100 UNIT/ML injection Inject 0.16 mLs (16 Units total) into the skin at bedtime. 10/16/19   Meredeth Ide, MD  metFORMIN (GLUCOPHAGE) 500 MG tablet Take 1,000 mg by mouth 2 (two) times daily. 07/24/19   [provider]  Omega-3 1000 MG CAPS Take 1,000 mg by mouth in the morning and at bedtime. 02/22/20 02/21/21  [provider]  oxyCODONE (OXY IR/ROXICODONE) 5 MG immediate release tablet Take 1 tablet (5 mg total) by mouth every 4 (four) hours as needed for moderate pain. 02/29/20   Swayze, Ava, DO  pantoprazole (PROTONIX) 40 MG tablet Take 1 tablet (40 mg total) by mouth daily. 02/29/20 02/28/21  Swayze, Ava, DO  polyethylene glycol (MIRALAX / GLYCOLAX) 17 g packet Take 17 g by mouth daily. 03/01/20   Swayze, Ava, DO    Allergies Latex, Penicillin g, and Penicillins  Family History  Problem Relation Age of Onset   Diabetes Mellitus II Maternal Grandmother    Diabetes Mellitus II Sister     Social History Social History   Tobacco Use   Smoking status: Never   Smokeless tobacco: Never  Vaping Use   Vaping Use:  Never used  Substance Use Topics   Alcohol use: No   Drug use: No    Review of Systems  Constitutional: No fever/chills Eyes: No visual changes. ENT: No sore throat. Cardiovascular: Denies chest pain. Respiratory: Denies shortness of breath. Gastrointestinal: Positive for abdominal pain, nausea and vomiting.  No diarrhea.  No constipation. Genitourinary: Negative for dysuria. Musculoskeletal: Negative for back pain. Skin:  Negative for rash. Neurological: Negative for headaches, focal weakness or numbness.  ____________________________________________   PHYSICAL EXAM:  VITAL SIGNS: Vitals:   08/27/20 1330 08/27/20 1400  BP: 139/86 136/82  Pulse: 93 100  Resp: (!) 42 17  Temp:    SpO2: 94% 94%     Constitutional: Alert and oriented.  Appears quite uncomfortable, though in no acute distress. Eyes: Conjunctivae are normal. PERRL. EOMI. Head: Atraumatic. Nose: No congestion/rhinnorhea. Mouth/Throat: Mucous membranes are dry.  Oropharynx non-erythematous. Neck: No stridor. No cervical spine tenderness to palpation. Cardiovascular: Normal rate, regular rhythm. Grossly normal heart sounds.  Good peripheral circulation. Respiratory: Normal respiratory effort.  No retractions. Lungs CTAB. Gastrointestinal: Soft , nondistended. No CVA tenderness. Diffuse tenderness and voluntary guarding. Musculoskeletal: No lower extremity tenderness nor edema.  No joint effusions. No signs of acute trauma. Neurologic:  Normal speech and language. No gross focal neurologic deficits are appreciated. No gait instability noted. Skin:  Skin is warm, dry and intact. No rash noted. Psychiatric: Mood and affect are normal. Speech and behavior are normal.  ____________________________________________   LABS (all labs ordered are listed, but only abnormal results are displayed)  Labs Reviewed  COMPREHENSIVE METABOLIC PANEL - Abnormal; Notable for the following components:      Result Value   Potassium 3.2 (*)    Glucose, Bld 193 (*)    Creatinine, Ser 0.34 (*)    Calcium 8.8 (*)    All other components within normal limits  URINALYSIS, COMPLETE (UACMP) WITH MICROSCOPIC - Abnormal; Notable for the following components:   Color, Urine STRAW (*)    APPearance CLEAR (*)    Specific Gravity, Urine 1.039 (*)    Glucose, UA >=500 (*)    Ketones, ur 80 (*)    Protein, ur 30 (*)    All other components within normal limits   CBC - Abnormal; Notable for the following components:   WBC 14.6 (*)    Hemoglobin 11.9 (*)    HCT 33.1 (*)    Platelets 143 (*)    All other components within normal limits  TRIGLYCERIDES - Abnormal; Notable for the following components:   Triglycerides >5,000 (*)    All other components within normal limits  CBG MONITORING, ED - Abnormal; Notable for the following components:   Glucose-Capillary 226 (*)    All other components within normal limits  CBG MONITORING, ED - Abnormal; Notable for the following components:   Glucose-Capillary 221 (*)    All other components within normal limits  CBG MONITORING, ED - Abnormal; Notable for the following components:   Glucose-Capillary 236 (*)    All other components within normal limits  CBG MONITORING, ED - Abnormal; Notable for the following components:   Glucose-Capillary 218 (*)    All other components within normal limits  SARS CORONAVIRUS 2 (TAT 6-24 HRS)  LIPASE, BLOOD  CBC   ____________________________________________  12 Lead EKG  Sinus rhythm, rate of 61 bpm.  Normal axis and intervals.  No STEMI. ____________________________________________  RADIOLOGY  ED MD interpretation: CT reviewed by me with peripancreatic stranding  Official radiology  report(s): CT ABDOMEN PELVIS W CONTRAST  Result Date: 08/27/2020 CLINICAL DATA:  Severe periumbilical and epigastric pain. Concern for pancreatitis. EXAM: CT ABDOMEN AND PELVIS WITH CONTRAST TECHNIQUE: Multidetector CT imaging of the abdomen and pelvis was performed using the standard protocol following bolus administration of intravenous contrast. CONTRAST:  OMNIPAQUE IOHEXOL 300 MG/ML  SOLN COMPARISON:  CT 02/28/2020 02/26/2020 FINDINGS: Lower chest: Lung bases are clear. Hepatobiliary: Low-attenuation in the liver suggestive of hepatic steatosis gallbladder normal. No biliary duct dilatation. No radiodense gallstones. No gallbladder distension. Common bile duct is normal  caliber. There is subtle enhancement of the mucosa of the common hepatic duct (image 32/series 4/coronal Pancreas: There is a rim of low-density fluid surrounding the head and mid body of the pancreas. There is no organized fluid collections. There is mild heterogeneous hypoenhancement of the head of the pancreas (image 37/2). Body and tail the pancreas enhance normally. There is no pancreatic duct dilatation. Subtle enhancement of the common bile duct in the head of the pancreas (image 35/2) No evidence of vascular complication associated with pancreatitis Spleen: Normal spleen Adrenals/urinary tract: Adrenal glands and kidneys are normal. The ureters and bladder normal. Stomach/Bowel: Stomach, small-bowel and cecum are normal. The appendix is not identified but there is no pericecal inflammation to suggest appendicitis. The colon and rectosigmoid colon are normal. Vascular/Lymphatic: Abdominal aorta is normal caliber. No periportal or retroperitoneal adenopathy. No pelvic adenopathy. Reproductive: Post hysterectomy.  Adnexa unremarkable Other: No free fluid the pelvis.  No ascites. Musculoskeletal: No aggressive osseous lesion. IMPRESSION: 1. Mild acute pancreatitis involving the head and mid body of the pancreas. No organized fluid collections. Mild hypoenhancement of head of pancreas; the body and tail of the pancreas enhance normally. 2. Mild mucosal enhancement of the common hepatic duct and common bile duct. Cannot exclude subtle cholangitis. No biliary duct dilatation. 3. Gallbladder normal by CT imaging.  No radiodense stones. 4. Hepatic steatosis Electronically Signed   By: Genevive Bi M.D.   On: 08/27/2020 10:05    ____________________________________________   PROCEDURES and INTERVENTIONS  Procedure(s) performed (including Critical Care):  .1-3 Lead EKG Interpretation  Date/Time: 08/27/2020 10:48 AM Performed by: Delton Prairie, MD Authorized by: Delton Prairie, MD     Interpretation:  normal     ECG rate:  90   ECG rate assessment: normal     Rhythm: sinus rhythm     Ectopy: none     Conduction: normal   .Critical Care  Date/Time: 08/27/2020 10:48 AM Performed by: Delton Prairie, MD Authorized by: Delton Prairie, MD   Critical care provider statement:    Critical care time (minutes):  45   Critical care was necessary to treat or prevent imminent or life-threatening deterioration of the following conditions:  Metabolic crisis   Critical care was time spent personally by me on the following activities:  Discussions with consultants, evaluation of patient's response to treatment, examination of patient, ordering and performing treatments and interventions, ordering and review of laboratory studies, ordering and review of radiographic studies, pulse oximetry, re-evaluation of patient's condition, obtaining history from patient or surrogate and review of old charts  Medications  insulin (MYXREDLIN) 100 units/100 mL infusion for hypertriglyceridemia-induced pancreatitis (0.1 Units/kg/hr  54.4 kg Intravenous Infusion Verify 08/27/20 1403)  dextrose 5 % solution ( Intravenous Infusion Verify 08/27/20 1403)  lactated ringers infusion ( Intravenous Infusion Verify 08/27/20 1403)  lactated ringers bolus 1,000 mL (0 mLs Intravenous Stopped 08/27/20 1052)  HYDROmorphone (DILAUDID) injection 1 mg (1 mg Intravenous  Given 08/27/20 0856)  iohexol (OMNIPAQUE) 300 MG/ML solution 100 mL (100 mLs Intravenous Contrast Given 08/27/20 0934)  HYDROmorphone (DILAUDID) injection 0.5 mg (0.5 mg Intravenous Given 08/27/20 1123)  ondansetron (ZOFRAN) injection 4 mg (4 mg Intravenous Given 08/27/20 1213)  HYDROmorphone (DILAUDID) injection 0.5 mg (0.5 mg Intravenous Given 08/27/20 1353)    ____________________________________________   MDM / ED COURSE   45 year old woman presents to the ED with evidence of recurrence of acute pancreatitis most attributable to hypertriglyceridemia, requiring transfer to facilitate  plasma exchange for appropriate treatment.  She appears quite uncomfortable on presentation, improving with IV analgesia.  Diffusely tender abdomen with voluntary guarding without localizing or peritoneal features.  Blood work with normal lipase, and leukocytosis to 14 is noted.  CT imaging obtained and demonstrates peripancreatic stranding and inflammatory signs.  Blood work later returns with significant hypertriglyceridemia.  I discussed the case with hospitalist, who recommends transfer due to lack of plasma exchange in our facility.  [place] current availability [facility] [place] [facility], and [facility] except[facility].  Transfer to their facility for further work-up and management.  Clinical Course as of 08/27/20 1425  Mon Aug 27, 2020  1013 Reassessed.  Patient appears more comfortable to me.  We discussed CT results and evidence of recurrence of pancreatitis.  We discussed medical admission and she is agreeable. [DS]  1034 I discuss the case with Dr. Clyde LundborgNiu.  I did not realize that we could not perform plasma exchange at this hospital.  He recommends transfer to facilitate this, which is reasonable.  We will page [facility] to discuss transfer [DS]  1035 Updated RN on plan of care. Transfer and insulin drip [DS]  1049 Callback from Dr. Katrinka BlazingSmith, [facility]. He requests that I speak with nephrology at [facility], and accepts patient in transfer pending that conversation.  [DS]  1056 Dr. Thedore MinsSingh, [facility], they are having staffing issues and unable to even make it happen today. Recommends searching around, [facility]i [facility]uke, [place], for beds [DS]  1112 I speak with Casimiro NeedleMichael from [facility] who takes patient info [DS]  1217 Dr Leron CroakHager, [facility], accepted to intermediate care bed. Waiting on a bed. Hopefully in the next 4 hours. Should be this afternoon.  [DS]    Clinical Course User Index [DS] Delton PrairieSmith, Jamaiya Tunnell, MD     ____________________________________________   FINAL CLINICAL IMPRESSION(S) / ED DIAGNOSES  Final diagnoses:  Other acute pancreatitis without infection or necrosis  Hypertriglyceridemia     ED Discharge Orders     None        Keonta Alsip Katrinka BlazingSmith   Note:  This document was prepared using Dragon voice recognition software and may include unintentional dictation errors.    Delton PrairieSmith, Necie Wilcoxson, MD 08/27/20 [phone]

## 2020-08-27 NOTE — ED Notes (Signed)
[facility]  CALLED  PER  DR  Delton Prairie  MD

## 2020-08-29 LAB — CBG MONITORING, ED: Glucose-Capillary: 200 mg/dL — ABNORMAL HIGH (ref 70–99)

## 2020-09-04 MED FILL — Hydromorphone HCl Inj 2 MG/ML: INTRAMUSCULAR | Qty: 0.5 | Status: AC

## 2020-09-18 ENCOUNTER — Other Ambulatory Visit: Payer: Self-pay

## 2020-09-18 ENCOUNTER — Encounter: Payer: Self-pay | Admitting: Radiology

## 2020-09-18 ENCOUNTER — Emergency Department: Payer: BLUE CROSS/BLUE SHIELD

## 2020-09-18 ENCOUNTER — Inpatient Hospital Stay: Payer: BLUE CROSS/BLUE SHIELD

## 2020-09-18 ENCOUNTER — Inpatient Hospital Stay
Admission: EM | Admit: 2020-09-18 | Discharge: 2020-09-23 | DRG: 440 | Disposition: A | Discharge status: Home / Self Care | Payer: BLUE CROSS/BLUE SHIELD | Attending: Internal Medicine | Admitting: Internal Medicine

## 2020-09-18 DIAGNOSIS — K5903 Drug induced constipation: Secondary | ICD-10-CM | POA: Diagnosis not present

## 2020-09-18 DIAGNOSIS — R079 Chest pain, unspecified: Secondary | ICD-10-CM | POA: Diagnosis not present

## 2020-09-18 DIAGNOSIS — Z880 Allergy status to penicillin: Secondary | ICD-10-CM

## 2020-09-18 DIAGNOSIS — K859 Acute pancreatitis without necrosis or infection, unspecified: Secondary | ICD-10-CM | POA: Diagnosis present

## 2020-09-18 DIAGNOSIS — Z79899 Other long term (current) drug therapy: Secondary | ICD-10-CM | POA: Diagnosis not present

## 2020-09-18 DIAGNOSIS — R1084 Generalized abdominal pain: Secondary | ICD-10-CM | POA: Diagnosis not present

## 2020-09-18 DIAGNOSIS — Z91040 Latex allergy status: Secondary | ICD-10-CM

## 2020-09-18 DIAGNOSIS — E1165 Type 2 diabetes mellitus with hyperglycemia: Secondary | ICD-10-CM | POA: Diagnosis present

## 2020-09-18 DIAGNOSIS — Z8616 Personal history of COVID-19: Secondary | ICD-10-CM | POA: Diagnosis not present

## 2020-09-18 DIAGNOSIS — E781 Pure hyperglyceridemia: Secondary | ICD-10-CM | POA: Diagnosis present

## 2020-09-18 DIAGNOSIS — T402X5A Adverse effect of other opioids, initial encounter: Secondary | ICD-10-CM | POA: Diagnosis not present

## 2020-09-18 DIAGNOSIS — D649 Anemia, unspecified: Secondary | ICD-10-CM | POA: Diagnosis present

## 2020-09-18 DIAGNOSIS — Z794 Long term (current) use of insulin: Secondary | ICD-10-CM | POA: Diagnosis not present

## 2020-09-18 DIAGNOSIS — K861 Other chronic pancreatitis: Secondary | ICD-10-CM | POA: Diagnosis present

## 2020-09-18 DIAGNOSIS — Z833 Family history of diabetes mellitus: Secondary | ICD-10-CM

## 2020-09-18 DIAGNOSIS — R0602 Shortness of breath: Secondary | ICD-10-CM

## 2020-09-18 DIAGNOSIS — Z20822 Contact with and (suspected) exposure to covid-19: Secondary | ICD-10-CM | POA: Diagnosis present

## 2020-09-18 DIAGNOSIS — E785 Hyperlipidemia, unspecified: Secondary | ICD-10-CM | POA: Diagnosis present

## 2020-09-18 DIAGNOSIS — IMO0002 Reserved for concepts with insufficient information to code with codable children: Secondary | ICD-10-CM | POA: Diagnosis present

## 2020-09-18 DIAGNOSIS — K863 Pseudocyst of pancreas: Secondary | ICD-10-CM | POA: Diagnosis present

## 2020-09-18 DIAGNOSIS — R109 Unspecified abdominal pain: Secondary | ICD-10-CM | POA: Diagnosis present

## 2020-09-18 DIAGNOSIS — K219 Gastro-esophageal reflux disease without esophagitis: Secondary | ICD-10-CM | POA: Diagnosis present

## 2020-09-18 LAB — CBC
HCT: 28.5 % — ABNORMAL LOW (ref 36.0–46.0)
Hemoglobin: 8.8 g/dL — ABNORMAL LOW (ref 12.0–15.0)
MCH: 28.2 pg (ref 26.0–34.0)
MCHC: 30.9 g/dL (ref 30.0–36.0)
MCV: 91.3 fL (ref 80.0–100.0)
Platelets: 588 10*3/uL — ABNORMAL HIGH (ref 150–400)
RBC: 3.12 MIL/uL — ABNORMAL LOW (ref 3.87–5.11)
RDW: 14 % (ref 11.5–15.5)
WBC: 6 10*3/uL (ref 4.0–10.5)
nRBC: 0 % (ref 0.0–0.2)

## 2020-09-18 LAB — COMPREHENSIVE METABOLIC PANEL
ALT: 16 U/L (ref 0–44)
AST: 29 U/L (ref 15–41)
Albumin: 3.7 g/dL (ref 3.5–5.0)
Alkaline Phosphatase: 41 U/L (ref 38–126)
Anion gap: 10 (ref 5–15)
BUN: 10 mg/dL (ref 6–20)
CO2: 23 mmol/L (ref 22–32)
Calcium: 9 mg/dL (ref 8.9–10.3)
Chloride: 105 mmol/L (ref 98–111)
Creatinine, Ser: 0.45 mg/dL (ref 0.44–1.00)
GFR, Estimated: >60 mL/min (ref >60)
Glucose, Bld: 132 mg/dL — ABNORMAL HIGH (ref 70–99)
Potassium: 3.6 mmol/L (ref 3.5–5.1)
Sodium: 138 mmol/L (ref 135–145)
Total Bilirubin: 0.6 mg/dL (ref 0.3–1.2)
Total Protein: 8.1 g/dL (ref 6.5–8.1)

## 2020-09-18 LAB — URINALYSIS, COMPLETE (UACMP) WITH MICROSCOPIC
Bilirubin Urine: NEGATIVE
Glucose, UA: NEGATIVE mg/dL
Hgb urine dipstick: NEGATIVE
Ketones, ur: 5 mg/dL — AB
Leukocytes,Ua: NEGATIVE
Nitrite: NEGATIVE
Protein, ur: 30 mg/dL — AB
Specific Gravity, Urine: 1.028 (ref 1.005–1.030)
pH: 5 (ref 5.0–8.0)

## 2020-09-18 LAB — RESP PANEL BY RT-PCR (FLU A&B, COVID) ARPGX2
Influenza A by PCR: NEGATIVE
Influenza B by PCR: NEGATIVE
SARS Coronavirus 2 by RT PCR: NEGATIVE

## 2020-09-18 LAB — TRIGLYCERIDES: Triglycerides: 93 mg/dL (ref <150)

## 2020-09-18 LAB — LIPASE, BLOOD: Lipase: 40 U/L (ref 11–51)

## 2020-09-18 LAB — TROPONIN I (HIGH SENSITIVITY)
Troponin I (High Sensitivity): <2 ng/L (ref <18)
Troponin I (High Sensitivity): <2 ng/L (ref <18)

## 2020-09-18 LAB — POC URINE PREG, ED: Preg Test, Ur: NEGATIVE

## 2020-09-18 MED ORDER — ONDANSETRON HCL 4 MG/2ML IJ SOLN
4.0000 mg | Freq: Once | INTRAMUSCULAR | Status: AC
  Administered 2020-09-18: 4 mg via INTRAVENOUS
  Filled 2020-09-18: qty 2

## 2020-09-18 MED ORDER — ACETAMINOPHEN 325 MG PO TABS
650.0000 mg | ORAL_TABLET | Freq: Four times a day (QID) | ORAL | Status: DC | PRN
  Administered 2020-09-19 – 2020-09-23 (×4): 650 mg via ORAL
  Filled 2020-09-18 (×4): qty 2

## 2020-09-18 MED ORDER — INSULIN ASPART 100 UNIT/ML IJ SOLN: 0.0000 [IU] | Freq: Three times a day (TID) | INTRAMUSCULAR | Status: DC

## 2020-09-18 MED ORDER — SODIUM CHLORIDE 0.9 % IV SOLN: INTRAVENOUS | Status: AC

## 2020-09-18 MED ORDER — IOHEXOL 300 MG/ML  SOLN
75.0000 mL | Freq: Once | INTRAMUSCULAR | Status: AC | PRN
  Administered 2020-09-18: 75 mL via INTRAVENOUS

## 2020-09-18 MED ORDER — GABAPENTIN 100 MG PO CAPS: 100.0000 mg | ORAL_CAPSULE | Freq: Three times a day (TID) | ORAL | Status: DC

## 2020-09-18 MED ORDER — PANTOPRAZOLE SODIUM 40 MG IV SOLR
40.0000 mg | Freq: Two times a day (BID) | INTRAVENOUS | Status: DC
  Administered 2020-09-18: 40 mg via INTRAVENOUS
  Filled 2020-09-18: qty 40

## 2020-09-18 MED ORDER — OXYCODONE HCL 5 MG PO TABS
5.0000 mg | ORAL_TABLET | ORAL | Status: DC | PRN
  Administered 2020-09-19 – 2020-09-22 (×14): 5 mg via ORAL
  Filled 2020-09-18 (×15): qty 1

## 2020-09-18 MED ORDER — MORPHINE SULFATE (PF) 2 MG/ML IV SOLN
2.0000 mg | INTRAVENOUS | Status: DC | PRN
Start: 2020-09-18 — End: 2020-09-19
  Administered 2020-09-19 (×2): 2 mg via INTRAVENOUS
  Filled 2020-09-18 (×2): qty 1

## 2020-09-18 MED ORDER — SODIUM CHLORIDE 0.9 % IV BOLUS
1000.0000 mL | Freq: Once | INTRAVENOUS | Status: AC
  Administered 2020-09-18: 1000 mL via INTRAVENOUS

## 2020-09-18 MED ORDER — ACETAMINOPHEN 650 MG RE SUPP: 650.0000 mg | Freq: Four times a day (QID) | RECTAL | Status: DC | PRN

## 2020-09-18 MED ORDER — MORPHINE SULFATE (PF) 4 MG/ML IV SOLN
4.0000 mg | Freq: Once | INTRAVENOUS | Status: AC
  Administered 2020-09-18: 4 mg via INTRAVENOUS
  Filled 2020-09-18: qty 1

## 2020-09-18 MED ORDER — ATORVASTATIN CALCIUM 20 MG PO TABS: 20.0000 mg | ORAL_TABLET | Freq: Every day | ORAL | Status: DC

## 2020-09-18 NOTE — ED Notes (Signed)
[facility]  CALLED PER  CAITLIN  RODGERS PA

## 2020-09-18 NOTE — ED Notes (Signed)
Pt accepted to a waitlist at [facility]. Awaiting room assignment per Lenard Lance, MD

## 2020-09-18 NOTE — H&P (Signed)
History and Physical    Sabrina Hanna NUU:[id number] DOB: 1975/11/01 DOA: 09/18/2020  PCP: Leotis Shames, MD    Patient coming from:  Home    Chief Complaint:  Abdominal pain    HPI: Sabrina Hanna is a 45 y.o. female seen in ed with complaints of abdominal pain and chest pain since her d/c from [facility] on 09/12/20. Pt underwent plasmapheresis for hypertriglyceridemia related acute pancreatitis.  Patient reports the chest pain began today and is radiating.  Patient denies any shortness of breath palpitations fevers or chills.  Patient does report nausea but no vomiting no bleeding no headaches no dizziness no speech issues.  Patient also denies any hematochezia or melena.  Patient's last A1c over 10 in December.pt gives hpi. Her husband is disable and she needs to get better and leave.  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  Patient also has a history of intentional metformin overdose, take, hyperlipidemia, abnormal LFT.  Patient has allergies to penicillin and latex.  ED Course:  Vitals:   09/18/20 1900 09/18/20 2000 09/18/20 2100 09/18/20 2200  BP: 103/72 102/68 101/67 97/65  Pulse:  80 81 83  Resp: (!) 22 20 (!) 21 18  Temp:      TempSrc:      SpO2:  100% 100% 99%  Weight:      Height:      In the emergency room patient is alert awake oriented mild distress due to pain patient has been receiving pain medications which help relieve the pain.  Vitals are stable patient is afebrile.  Blood work shows glucose of 132 588.  EKG shows normal sinus rhythm at 97 with a normal QTC intervals no ST or T wave changes.  Review of Systems:  Review of Systems  Gastrointestinal:  Positive for abdominal pain and nausea.  All other systems reviewed and are negative.   Past Medical History:  Diagnosis Date   Diabetes mellitus without complication (HCC)    Hypertriglyceridemia    Pancreatitis      Past Surgical History:  Procedure Laterality Date   ABDOMINAL HYSTERECTOMY     LAPAROSCOPIC UNILATERAL SALPINGO OOPHERECTOMY Right      reports that she has never smoked. She has never used smokeless tobacco. She reports that she does not drink alcohol and does not use drugs.  Allergies  Allergen Reactions   Latex Rash   Penicillin G Rash   Penicillins Rash and Other (See Comments)    Has patient had a PCN reaction causing immediate rash, facial/tongue/throat swelling, SOB or lightheadedness with hypotension: No Has patient had a PCN reaction causing severe rash involving mucus membranes or skin necrosis: No Has patient had a PCN reaction that required hospitalization No Has patient had a PCN reaction occurring within the last 10 years: No If all of the above answers are "NO", then may proceed with Cephalosporin use.     Family History  Problem Relation Age of Onset   Diabetes Mellitus II Maternal Grandmother    Diabetes Mellitus II Sister     Prior to Admission medications   Medication Sig Start Date End Date Taking? Authorizing Provider  atorvastatin (LIPITOR) 20 MG tablet Take 20 mg by mouth at bedtime. 06/26/17   [provider]  fenofibrate 54 MG tablet Take 1 tablet (54 mg total) by mouth daily. 03/01/20   Swayze, Ava, DO  gabapentin (NEURONTIN) 100 MG capsule Take 100 mg by mouth 3 (three) times  daily. 08/24/19   [provider]  insulin aspart (NOVOLOG FLEXPEN) 100 UNIT/ML FlexPen Inject 5-15 Units into the skin 3 (three) times daily with meals. Uses sliding scale 10/16/19   Meredeth Ide, MD  LANTUS 100 UNIT/ML injection Inject 0.16 mLs (16 Units total) into the skin at bedtime. 10/16/19   Meredeth Ide, MD  metFORMIN (GLUCOPHAGE) 500 MG tablet Take 1,000 mg by mouth 2 (two) times daily. 07/24/19   [provider]  Omega-3 1000 MG CAPS Take 1,000 mg by mouth in the morning and at bedtime. 02/22/20 02/21/21  [provider]  oxyCODONE (OXY  IR/ROXICODONE) 5 MG immediate release tablet Take 1 tablet (5 mg total) by mouth every 4 (four) hours as needed for moderate pain. 02/29/20   Swayze, Ava, DO  pantoprazole (PROTONIX) 40 MG tablet Take 1 tablet (40 mg total) by mouth daily. 02/29/20 02/28/21  Swayze, Ava, DO  polyethylene glycol (MIRALAX / GLYCOLAX) 17 g packet Take 17 g by mouth daily. 03/01/20   Swayze, Ava, DO    Physical Exam: Vitals:   09/18/20 1900 09/18/20 2000 09/18/20 2100 09/18/20 2200  BP: 103/72 102/68 101/67 97/65  Pulse:  80 81 83  Resp: (!) 22 20 (!) 21 18  Temp:      TempSrc:      SpO2:  100% 100% 99%  Weight:      Height:       Physical Exam Vitals reviewed.  Constitutional:      General: She is not in acute distress.    Appearance: She is not ill-appearing.  HENT:     Head: Normocephalic and atraumatic.     Right Ear: External ear normal.     Left Ear: External ear normal.     Nose: Nose normal.     Mouth/Throat:     Mouth: Mucous membranes are moist.  Eyes:     Extraocular Movements: Extraocular movements intact.  Cardiovascular:     Rate and Rhythm: Normal rate and regular rhythm.     Pulses: Normal pulses.     Heart sounds: Normal heart sounds.  Pulmonary:     Effort: Pulmonary effort is normal.     Breath sounds: Normal breath sounds.  Abdominal:     General: Bowel sounds are normal.     Palpations: Abdomen is soft. There is no mass.     Tenderness: There is abdominal tenderness. There is no guarding.     Hernia: No hernia is present.  Musculoskeletal:     Right lower leg: No edema.     Left lower leg: No edema.  Skin:    General: Skin is warm.  Neurological:     General: No focal deficit present.     Mental Status: She is alert and oriented to person, place, and time.  Psychiatric:        Mood and Affect: Mood normal.        Behavior: Behavior normal.     Labs on Admission: I have personally reviewed following labs and imaging studies  No results for input(s): CKTOTAL, CKMB,  TROPONINI in the last 72 hours. Lab Results  Component Value Date   WBC 6.0 09/18/2020   HGB 8.8 (L) 09/18/2020   HCT 28.5 (L) 09/18/2020   MCV 91.3 09/18/2020   PLT 588 (H) 09/18/2020    Recent Labs  Lab 09/18/20 1446  NA 138  K 3.6  CL 105  CO2 23  BUN 10  CREATININE 0.45  CALCIUM  9.0  PROT 8.1  BILITOT 0.6  ALKPHOS 41  ALT 16  AST 29  GLUCOSE 132*   Lab Results  Component Value Date   CHOL 696 (H) 02/23/2020   HDL NOT REPORTED DUE TO HIGH TRIGLYCERIDES 02/23/2020   LDLCALC UNABLE TO CALCULATE IF TRIGLYCERIDE OVER 400 mg/dL 78/29/5621   TRIG 93 30/86/5784   No results found for: DDIMER Invalid input(s): POCBNP  Urinalysis    Component Value Date/Time   COLORURINE YELLOW (A) 09/18/2020 1446   APPEARANCEUR HAZY (A) 09/18/2020 1446   LABSPEC 1.028 09/18/2020 1446   PHURINE 5.0 09/18/2020 1446   GLUCOSEU NEGATIVE 09/18/2020 1446   HGBUR NEGATIVE 09/18/2020 1446   BILIRUBINUR NEGATIVE 09/18/2020 1446   KETONESUR 5 (A) 09/18/2020 1446   PROTEINUR 30 (A) 09/18/2020 1446   NITRITE NEGATIVE 09/18/2020 1446   LEUKOCYTESUR NEGATIVE 09/18/2020 1446    COVID-19 Labs No results for input(s): DDIMER, FERRITIN, LDH, CRP in the last 72 hours. Lab Results  Component Value Date   SARSCOV2NAA NEGATIVE 08/27/2020   SARSCOV2NAA NEGATIVE 02/23/2020   SARSCOV2NAA POSITIVE (A) 10/11/2019    Radiological Exams on Admission: DG Chest 2 View  Result Date: 09/18/2020 CLINICAL DATA:  Abdominal and chest pain for 4 weeks. EXAM: CHEST - 2 VIEW COMPARISON:  PA and lateral chest 02/22/2020. FINDINGS: Small left pleural effusion and basilar airspace disease are seen. No right effusion. The right lung is clear. Heart size is normal. No pneumothorax. No acute or focal bony abnormality. IMPRESSION: Small left pleural effusion and basilar airspace disease which is likely atelectasis. Electronically Signed   By: Drusilla Kanner M.D.   On: 09/18/2020 18:57   CT ABDOMEN PELVIS W  CONTRAST  Result Date: 09/18/2020 CLINICAL DATA:  Abdominal and chest pain for approximately 4 weeks. The patient was diagnosed with pancreatitis and admitted to another hospital 3 weeks ago. EXAM: CT ABDOMEN AND PELVIS WITH CONTRAST TECHNIQUE: Multidetector CT imaging of the abdomen and pelvis was performed using the standard protocol following bolus administration of intravenous contrast. CONTRAST:  75 mL OMNIPAQUE IOHEXOL 300 MG/ML  SOLN COMPARISON:  CT abdomen and pelvis 08/27/2020. FINDINGS: Lower chest: The patient has a new small left pleural effusion and associated compressive left basilar atelectasis. No right effusion or pericardial effusion. Imaged right lung is clear. Hepatobiliary: No focal liver abnormality is seen. No gallstones, gallbladder wall thickening, or biliary dilatation. Pancreas: A new fluid collection which surrounds the body and extends lateral to the tail of the pancreas measures approximately 12 cm craniocaudal by 5 cm AP by 13 cm transverse and is compatible with a pseudocyst. A second fluid collection lateral to the head of the pancreas is extends a total of 8.2 cm craniocaudal and measures up to 2.9 by 1.8 cm in the axial plane. The pancreas enhances homogeneously. Spleen: Normal in size without focal abnormality. Adrenals/Urinary Tract: Adrenal glands are unremarkable. Kidneys are normal, without renal calculi, focal lesion, or hydronephrosis. Bladder is unremarkable. Stomach/Bowel: Stomach is within normal limits. No evidence of appendicitis. No evidence of bowel wall thickening, distention, or inflammatory changes. Vascular/Lymphatic: No significant vascular findings are present. No enlarged abdominal or pelvic lymph nodes. Reproductive: Status post hysterectomy. No adnexal masses. Other: None. Musculoskeletal: Negative. IMPRESSION: Two new fluid collections about the pancreas described above are consistent with pseudocysts. The pancreas appears normal. Negative for pancreatic  necrosis. Small left pleural effusion and associated compressive atelectasis are new since the prior CT. Electronically Signed   By: Drusilla Kanner M.D.  On: 09/18/2020 17:08    EKG: Independently reviewed.  Sr 97, QTC OF 469, No st changes.    Assessment/Plan Principal Problem:   Abdominal pain Active Problems:   Acute pancreatitis without necrosis or infection, unspecified   GERD (gastroesophageal reflux disease)   Uncontrolled diabetes mellitus (HCC)   HLD (hyperlipidemia) Abdominal pain/acute pancreatitis: -We will admit patient to MedSurg unit for abdominal pain secondary to acute pancreatitis. -We will also obtain ultrasound data changed over abdomen to evaluate for any biliary etiology for abdominal pain.  Although CT with contrast is negative for any gallbladder wall thickening or biliary dilatation.  -Supportive care with IV PPI, IV fluids chloride, pain control with as needed meds, -N.p.o. except for sips with meds and ice chips.  GERD: -IV PPI therapy.  Uncontrolled diabetes mellitus type 2: -Glycemic protocol, A1c to follow-up. -Metformin, and home insulin regimen.  Hyperlipidemia: -We will continue patient on her statin therapy.  Anemia: -We will obtain a type and screen, stool guaiac, IV PPI, anemia panel. -Suspect anemia from chronic blood loss from the GI tract will investigate if patient uses any NSAIDs HPI is by using Spanish interpreter.  Chest pain: Since Friday , substernal, and oxycodone has been helping her chest pain. We will cycle troponin and get echo. V/q scan.   DVT prophylaxis:  SCD's.   Code Status:  Full code.   Family Communication:  Mirna MiresMontesdeoca, [facility](Spouse)  [phone] (Mobile)   Disposition Plan:  WAITLIST ON [facility] FOR TRANSFER.   Consults called:  GI-Dr.Tahiliani.   Admission status: Inpatient.     Gertha CalkinEkta V Myrel Rappleye MD [facility] [phone] How to contact the [facility] Attending or Consulting provider 7A - 7P or  covering provider during after hours 7P -7A, for this patient.    Check the care team in [facility] and look for a) attending/consulting [facility] provider listed and b) the [facility] team listed Log into [email or web address] and use [place]'s universal password to access. If you do not have the password, please contact the hospital operator. Locate the [facility] provider you are looking for under [facility] and page to a number that you can be directly reached. If you still have difficulty reaching the provider, please page the [facility] (Director on Call) for the Hospitalists listed on amion for assistance. [email or web address] Password [email or web address] 09/18/2020, 11:02 PM

## 2020-09-18 NOTE — ED Notes (Signed)
Report to Reuel Boom, RN, inpatient unit.  Transport requested for patient.

## 2020-09-18 NOTE — ED Triage Notes (Signed)
Pt here with abd and chest pain that started 4 weeks ago. Pt has pancreatitis and was admitted to [facility] 3 weeks ago. Abd pain is all across the lower abd. Pt reports nausea and has not been eating a lot lately to have normal bowel movements. Pt states she feels a lot of pressure in her chest that gets worse when she eats or walks.

## 2020-09-18 NOTE — ED Provider Notes (Signed)
-----------------------------------------   9:39 PM on 09/18/2020 ----------------------------------------- I have personally seen and evaluated the patient in conjunction with physician assistant Heron Sabins.  Patient continues to have significant abdominal pain.  Patient CT scan shows 2 pseudocysts.  Patient was recently admitted to [facility] for severe pancreatitis requiring plasma exchange due to severe hypertriglyceridemia.  Triglycerides today are in the 90s.  [facility] has accepted the patient to their service as a transfer given the pseudocysts however there is no beds available and the patient will remain on the wait list.  I spoke to our GI doctor Dr. Maximino Greenland, who states no urgent drainage needed and it would be appropriate to admit locally to our hospitalist.  We will admit to our hospitalist service for pancreatitis treatment until a bed is available at [facility] if needed.   Minna Antis, MD 09/18/20 2140

## 2020-09-18 NOTE — ED Notes (Signed)
CT SCAN  POWERSHARE  WITH  [facility] 

## 2020-09-18 NOTE — ED Notes (Signed)
Patient asking if she can have ice chips at this time. Per Dr. Lenard Lance, it's best the patient remain NPO, patient notified.

## 2020-09-18 NOTE — ED Provider Notes (Signed)
[facility] Emergency Department Provider Note  ____________________________________________   Event Date/Time   First MD Initiated Contact with Patient 09/18/20 1538     (approximate)  I have reviewed the triage vital signs and the nursing notes.   HISTORY  Chief Complaint Abdominal Pain and Chest Pain  Patient seen with the assistance of a medical Spanish interpreter  HPI Sabrina Hanna is a 45 y.o. female with past medical history significant for diabetes, recurrent pancreatitis, hypertriglyceridemia, GERD who reports to the emergency department for evaluation of persistent abdominal pain and new chest pain.  She was seen at our facility in early July with CT evidence of early pancreatitis, was transferred to [facility] due to limited bed availability.  She was at their facility for some time, required transfer to the ICU due to metabolic derangements and worsening abdominal pain concerning for abdominal compartment syndrome.  She states to me today that she left prior to completing her treatment due to needing to be home to help care for her husband.  She has used all of the prescribed oxycodone but was unable to get the pancreatic enzymes filled.  She reports the chest pain began today, she describes it as radiating from the abdomen.  She denies any associated shortness of breath, cough, fever chills or other illness symptoms.  She reports nausea but denies any vomiting or hematemesis.  She denies any melena or hematochezia.  She relates her abdominal pain as being diffuse, but worse on the left side.         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 Authorizing Provider  atorvastatin (LIPITOR) 20 MG tablet Take 20 mg by mouth at bedtime. 06/26/17   [provider]  fenofibrate 54 MG tablet Take 1 tablet (54 mg total) by mouth daily. 03/01/20   Swayze, Ava, DO  gabapentin (NEURONTIN) 100 MG capsule Take 100 mg by mouth 3 (three) times daily. 08/24/19   [provider]  insulin aspart (NOVOLOG FLEXPEN) 100 UNIT/ML FlexPen Inject 5-15 Units into the skin 3 (three) times daily with meals. Uses sliding scale 10/16/19   Meredeth Ide, MD  LANTUS 100 UNIT/ML injection Inject 0.16 mLs (16 Units total) into the skin at bedtime. 10/16/19   Meredeth Ide, MD  metFORMIN (GLUCOPHAGE) 500 MG tablet Take 1,000 mg by mouth 2 (two) times daily. 07/24/19   [provider]  Omega-3 1000 MG CAPS Take 1,000 mg by mouth in the morning and at bedtime. 02/22/20 02/21/21  [provider]  oxyCODONE (OXY IR/ROXICODONE) 5 MG immediate release tablet Take 1 tablet (5 mg total) by mouth every 4 (four) hours as needed for  moderate pain. 02/29/20   Swayze, Ava, DO  pantoprazole (PROTONIX) 40 MG tablet Take 1 tablet (40 mg total) by mouth daily. 02/29/20 02/28/21  Swayze, Ava, DO  polyethylene glycol (MIRALAX / GLYCOLAX) 17 g packet Take 17 g by mouth daily. 03/01/20   Swayze, Ava, DO    Allergies Latex, Penicillin g, and Penicillins  Family History  Problem Relation Age of Onset   Diabetes Mellitus II Maternal Grandmother     Diabetes Mellitus II Sister     Social History Social History   Tobacco Use   Smoking status: Never   Smokeless tobacco: Never  Vaping Use   Vaping Use: Never used  Substance Use Topics   Alcohol use: No   Drug use: No    Review of Systems Constitutional: No fever/chills Eyes: No visual changes. ENT: No sore throat. Cardiovascular: + chest pain. Respiratory: Denies shortness of breath. Gastrointestinal: + abdominal pain.  + nausea, no vomiting.  No diarrhea.  No constipation. Genitourinary: Negative for dysuria. Musculoskeletal: Negative for back pain. Skin: Negative for rash. Neurological: Negative for headaches, focal weakness or numbness.  ____________________________________________   PHYSICAL EXAM:  VITAL SIGNS: ED Triage Vitals  Enc Vitals Group     BP 09/18/20 1437 97/65     Pulse Rate 09/18/20 1437 93     Resp 09/18/20 1437 18     Temp 09/18/20 1437 98.5 F (36.9 C)     Temp Source 09/18/20 1437 Oral     SpO2 09/18/20 1437 99 %     Weight 09/18/20 1444 130 lb (59 kg)     Height 09/18/20 1444 5' (1.524 m)     Head Circumference --      Peak Flow --      Pain Score 09/18/20 1443 10     Pain Loc --      Pain Edu? --      Excl. in GC? --    Constitutional: Alert and oriented.  Uncomfortable appearing but not in acute distress. Eyes: Conjunctivae are normal. PERRL. EOMI. Head: Atraumatic. Nose: No congestion/rhinnorhea. Mouth/Throat: Mucous membranes are moist.  Oropharynx non-erythematous. Neck: No stridor.   Cardiovascular: Normal rate, regular rhythm. Grossly normal heart sounds.  Good peripheral circulation. Respiratory: Normal respiratory effort.  No retractions. Lungs CTAB. Gastrointestinal: Soft.  No distention.  Diffuse tenderness with voluntary guarding without rebound.  No abdominal bruits. No CVA tenderness. Musculoskeletal: No lower extremity tenderness nor edema.  No joint effusions. Neurologic:  Normal speech and language. No gross focal  neurologic deficits are appreciated. No gait instability. Skin:  Skin is warm, dry and intact. No rash noted. Psychiatric: Mood and affect are normal. Speech and behavior are normal.  ____________________________________________   LABS (all labs ordered are listed, but only abnormal results are displayed)  Labs Reviewed  COMPREHENSIVE METABOLIC PANEL - Abnormal; Notable for the following components:      Result Value   Glucose, Bld 132 (*)    All other components within normal limits  CBC - Abnormal; Notable for the following components:   RBC 3.12 (*)    Hemoglobin 8.8 (*)    HCT 28.5 (*)    Platelets 588 (*)    All other components within normal limits  URINALYSIS, COMPLETE (UACMP) WITH MICROSCOPIC - Abnormal; Notable for the following components:   Color, Urine YELLOW (*)    APPearance HAZY (*)    Ketones, ur 5 (*)    Protein, ur 30 (*)    Bacteria, UA RARE (*)  All other components within normal limits  LIPASE, BLOOD  POC URINE PREG, ED  TROPONIN I (HIGH SENSITIVITY)  TROPONIN I (HIGH SENSITIVITY)   ____________________________________________  EKG  Normal sinus rhythm with a rate of 97 bpm.  No ST or T wave changes.  Normal QTC. ____________________________________________  RADIOLOGY  Official radiology report(s): DG Chest 2 View  Result Date: 09/18/2020 CLINICAL DATA:  Abdominal and chest pain for 4 weeks. EXAM: CHEST - 2 VIEW COMPARISON:  PA and lateral chest 02/22/2020. FINDINGS: Small left pleural effusion and basilar airspace disease are seen. No right effusion. The right lung is clear. Heart size is normal. No pneumothorax. No acute or focal bony abnormality. IMPRESSION: Small left pleural effusion and basilar airspace disease which is likely atelectasis. Electronically Signed   By: Drusilla Kanner M.D.   On: 09/18/2020 18:57   CT ABDOMEN PELVIS W CONTRAST  Result Date: 09/18/2020 CLINICAL DATA:  Abdominal and chest pain for approximately 4 weeks. The  patient was diagnosed with pancreatitis and admitted to another hospital 3 weeks ago. EXAM: CT ABDOMEN AND PELVIS WITH CONTRAST TECHNIQUE: Multidetector CT imaging of the abdomen and pelvis was performed using the standard protocol following bolus administration of intravenous contrast. CONTRAST:  75 mL OMNIPAQUE IOHEXOL 300 MG/ML  SOLN COMPARISON:  CT abdomen and pelvis 08/27/2020. FINDINGS: Lower chest: The patient has a new small left pleural effusion and associated compressive left basilar atelectasis. No right effusion or pericardial effusion. Imaged right lung is clear. Hepatobiliary: No focal liver abnormality is seen. No gallstones, gallbladder wall thickening, or biliary dilatation. Pancreas: A new fluid collection which surrounds the body and extends lateral to the tail of the pancreas measures approximately 12 cm craniocaudal by 5 cm AP by 13 cm transverse and is compatible with a pseudocyst. A second fluid collection lateral to the head of the pancreas is extends a total of 8.2 cm craniocaudal and measures up to 2.9 by 1.8 cm in the axial plane. The pancreas enhances homogeneously. Spleen: Normal in size without focal abnormality. Adrenals/Urinary Tract: Adrenal glands are unremarkable. Kidneys are normal, without renal calculi, focal lesion, or hydronephrosis. Bladder is unremarkable. Stomach/Bowel: Stomach is within normal limits. No evidence of appendicitis. No evidence of bowel wall thickening, distention, or inflammatory changes. Vascular/Lymphatic: No significant vascular findings are present. No enlarged abdominal or pelvic lymph nodes. Reproductive: Status post hysterectomy. No adnexal masses. Other: None. Musculoskeletal: Negative. IMPRESSION: Two new fluid collections about the pancreas described above are consistent with pseudocysts. The pancreas appears normal. Negative for pancreatic necrosis. Small left pleural effusion and associated compressive atelectasis are new since the prior CT.  Electronically Signed   By: Drusilla Kanner M.D.   On: 09/18/2020 17:08   ________________________________________   INITIAL IMPRESSION / ASSESSMENT AND PLAN / ED COURSE  As part of my medical decision making, I reviewed the following data within the electronic MEDICAL RECORD NUMBER Nursing notes reviewed and incorporated, Interpreter needed, Labs reviewed, Radiograph reviewed, Evaluated by EM attending Dr. Lenard Lance, discussed with [facility] transfer center, and Notes from prior ED visits        Patient is a 45 year old female who presents to the emergency department for evaluation of persistent abdominal pain now radiating into the chest after recent incomplete treatment for acute on chronic pancreatitis that was believed to be related to hypertriglyceridemia.  She was admitted at [facility] for this care, and transferred to the ICU during her stay due to metabolic derangements.  She presents today with a new chest  pain but also persistence of her abdominal pain and she is out of oxycodone.  See HPI.  In triage, patient is afebrile, has borderline low BP of 97/65, otherwise vitals are within normal limits.  Physical exam as above.  Breath sounds are normal, normal heart sounds.  She does have diffuse abdominal tenderness with guarding present.  Given this, laboratory evaluation includes CBC, CMP, lipase, troponin, urinalysis.  CMP with mild elevation in glucose of 132, otherwise unremarkable.  CBC with a hemoglobin of 8.8, changed from 11.33 weeks ago.  She also has elevation of her platelets at 588, new from prior.  Urinalysis demonstrates rare bacteria with no leukocytes or nitrites.  She also has proteinuria and ketonuria present.  Lipase is normal at 40, however this was also normal during prior pancreatitis.  Troponin normal at less than 2.  EKG within normal limits.  Given the patient's persistent and worsening pain, CT was obtained of the abdomen and pelvis with contrast.  The patient has 2 new fluid  collections when compared to prior CT representing probable pseudocyst, 1 measuring as large as 12 cm.  She also has a new left pleural effusion present.  Given the patient's complexity and prior requiring ICU transfer during treatment, the patient requires treatment at a larger tertiary center.  Spoke with [facility] transfer center to discuss possibility of transfer and awaiting return phone call from their general medicine team.  At this time, the patient is being handed off to attending Dr. Lenard LancePaduchowski, who is also evaluated the [facility].  Patient is stable at this time.        ____________________________________________   FINAL CLINICAL IMPRESSION(S) / ED DIAGNOSES  Final diagnoses:  None     ED Discharge Orders     None        Note:  This document was prepared using Dragon voice recognition software and may include unintentional dictation errors.    Lucy ChrisRodgers, Beatriz Settles J, PA 09/18/20 2020    Minna AntisPaduchowski, Kevin, MD 09/18/20 2209

## 2020-09-19 ENCOUNTER — Inpatient Hospital Stay (HOSPITAL_COMMUNITY)
Admit: 2020-09-19 | Discharge: 2020-09-19 | Disposition: A | Discharge status: Home / Self Care | Payer: BLUE CROSS/BLUE SHIELD | Attending: Internal Medicine | Admitting: Internal Medicine

## 2020-09-19 ENCOUNTER — Inpatient Hospital Stay: Payer: BLUE CROSS/BLUE SHIELD

## 2020-09-19 ENCOUNTER — Encounter: Payer: Self-pay | Admitting: Internal Medicine

## 2020-09-19 DIAGNOSIS — K863 Pseudocyst of pancreas: Principal | ICD-10-CM

## 2020-09-19 DIAGNOSIS — T402X5A Adverse effect of other opioids, initial encounter: Secondary | ICD-10-CM

## 2020-09-19 DIAGNOSIS — K5903 Drug induced constipation: Secondary | ICD-10-CM

## 2020-09-19 DIAGNOSIS — R079 Chest pain, unspecified: Secondary | ICD-10-CM | POA: Diagnosis not present

## 2020-09-19 DIAGNOSIS — R1084 Generalized abdominal pain: Secondary | ICD-10-CM

## 2020-09-19 LAB — IRON AND TIBC
Iron: 42 ug/dL (ref 28–170)
Saturation Ratios: 13 % (ref 10.4–31.8)
TIBC: 322 ug/dL (ref 250–450)
UIBC: 280 ug/dL

## 2020-09-19 LAB — COMPREHENSIVE METABOLIC PANEL
ALT: 14 U/L (ref 0–44)
AST: 23 U/L (ref 15–41)
Albumin: 3.1 g/dL — ABNORMAL LOW (ref 3.5–5.0)
Alkaline Phosphatase: 37 U/L — ABNORMAL LOW (ref 38–126)
Anion gap: 6 (ref 5–15)
BUN: 7 mg/dL (ref 6–20)
CO2: 23 mmol/L (ref 22–32)
Calcium: 8.3 mg/dL — ABNORMAL LOW (ref 8.9–10.3)
Chloride: 110 mmol/L (ref 98–111)
Creatinine, Ser: 0.41 mg/dL — ABNORMAL LOW (ref 0.44–1.00)
GFR, Estimated: >60 mL/min (ref >60)
Glucose, Bld: 72 mg/dL (ref 70–99)
Potassium: 3.3 mmol/L — ABNORMAL LOW (ref 3.5–5.1)
Sodium: 139 mmol/L (ref 135–145)
Total Bilirubin: 0.4 mg/dL (ref 0.3–1.2)
Total Protein: 6.8 g/dL (ref 6.5–8.1)

## 2020-09-19 LAB — FERRITIN: Ferritin: 192 ng/mL (ref 11–307)

## 2020-09-19 LAB — HEMOGLOBIN A1C
Hgb A1c MFr Bld: 9.2 % — ABNORMAL HIGH (ref 4.8–5.6)
Mean Plasma Glucose: 217 mg/dL

## 2020-09-19 LAB — CBC
HCT: 26.2 % — ABNORMAL LOW (ref 36.0–46.0)
Hemoglobin: 8.4 g/dL — ABNORMAL LOW (ref 12.0–15.0)
MCH: 29.3 pg (ref 26.0–34.0)
MCHC: 32.1 g/dL (ref 30.0–36.0)
MCV: 91.3 fL (ref 80.0–100.0)
Platelets: 480 10*3/uL — ABNORMAL HIGH (ref 150–400)
RBC: 2.87 MIL/uL — ABNORMAL LOW (ref 3.87–5.11)
RDW: 14 % (ref 11.5–15.5)
WBC: 5.4 10*3/uL (ref 4.0–10.5)
nRBC: 0 % (ref 0.0–0.2)

## 2020-09-19 LAB — ECHOCARDIOGRAM COMPLETE
AR max vel: 2.18 cm2
AV Area VTI: 2.18 cm2
AV Area mean vel: 2.07 cm2
AV Mean grad: 5 mmHg
AV Peak grad: 8.2 mmHg
Ao pk vel: 1.43 m/s
Area-P 1/2: 4.15 cm2
Height: 60 in
S' Lateral: 2.4 cm
Weight: 2080 oz

## 2020-09-19 LAB — VITAMIN B12: Vitamin B-12: 713 pg/mL (ref 180–914)

## 2020-09-19 LAB — GLUCOSE, CAPILLARY
Glucose-Capillary: 71 mg/dL (ref 70–99)
Glucose-Capillary: 85 mg/dL (ref 70–99)
Glucose-Capillary: 100 mg/dL — ABNORMAL HIGH (ref 70–99)

## 2020-09-19 LAB — D-DIMER, QUANTITATIVE: D-Dimer, Quant: 3.87 ug/mL-FEU — ABNORMAL HIGH (ref 0.00–0.50)

## 2020-09-19 LAB — FOLATE: Folate: 12.5 ng/mL (ref >5.9)

## 2020-09-19 LAB — GAMMA GT: GGT: 23 U/L (ref 7–50)

## 2020-09-19 MED ORDER — TECHNETIUM TO 99M ALBUMIN AGGREGATED
4.4600 | Freq: Once | INTRAVENOUS | Status: AC | PRN
  Administered 2020-09-19: 4.46 via INTRAVENOUS

## 2020-09-19 MED ORDER — SENNOSIDES-DOCUSATE SODIUM 8.6-50 MG PO TABS
1.0000 | ORAL_TABLET | Freq: Two times a day (BID) | ORAL | Status: DC
  Administered 2020-09-19 – 2020-09-23 (×9): 1 via ORAL
  Filled 2020-09-19 (×10): qty 1

## 2020-09-19 MED ORDER — GABAPENTIN 300 MG PO CAPS
300.0000 mg | ORAL_CAPSULE | Freq: Three times a day (TID) | ORAL | Status: DC
  Administered 2020-09-19 – 2020-09-20 (×6): 300 mg via ORAL
  Filled 2020-09-19 (×6): qty 1

## 2020-09-19 MED ORDER — PANTOPRAZOLE SODIUM 40 MG IV SOLR: 40.0000 mg | INTRAVENOUS | Status: DC

## 2020-09-19 MED ORDER — HYDROMORPHONE HCL 1 MG/ML IJ SOLN
0.5000 mg | INTRAMUSCULAR | Status: DC | PRN
  Administered 2020-09-19 – 2020-09-20 (×5): 0.5 mg via INTRAVENOUS
  Filled 2020-09-19 (×5): qty 1

## 2020-09-19 MED ORDER — PANTOPRAZOLE SODIUM 40 MG PO TBEC
40.0000 mg | DELAYED_RELEASE_TABLET | Freq: Every day | ORAL | Status: DC
  Administered 2020-09-19 – 2020-09-21 (×3): 40 mg via ORAL
  Filled 2020-09-19 (×3): qty 1

## 2020-09-19 NOTE — Progress Notes (Signed)
*  PRELIMINARY RESULTS* Echocardiogram 2D Echocardiogram has been performed.  Cristela Blue 09/19/2020, 2:10 PM

## 2020-09-19 NOTE — Consult Note (Signed)
Sabrina Hanna , MD [address], [place], [place], [address] [address], [place], [place], [address] Phone: [phone]  Fax: [phone]  Consultation  Referring Provider:    Dr. Georgeann Oppenheim Primary Care Physician:  Leotis Shames, MD Primary Gastroenterologist: [facility]         Reason for Consultation:     Pancreatic cyst  Date of Admission:  09/18/2020 Date of Consultation:  09/19/2020         HPI:   Sabrina Hanna is a 45 y.o. female presented to the emergency room with abdominal pain.  Discharged from [facility] on 09/12/2020 underwent plasmapheresis for hypertriglyceridemia induced pancreatitis.    She was previously seen in April 2021 by [facility] for pancreatic cyst.  History of acute pancreatitis in 2019 secondary to hyper triglyceridemia.  Based on chart review she has had at least 4 episodes of acute pancreatitis in the past.  Previously patient was discussed with Dr. Corliss Parish about pancreatic pseudocyst in April 2021 and plan was to watch and wait.  Subsequent studies showed that it had resolved.  April 2021: CT scan of the abdomen loculated fluid collection with mildly enhancing thin-walled adjacent to the pancreatic head measuring 6.2 x 4.8 x 3.2 cm and an additional fluid collection along the anterior pancreatic body and tail.  10/14/2019 pancreas appeared unremarkable   11/14/2019: Marked peripancreatic fat stranding and edema but no fluid collection  July 2022: CT scan of the abdomen and pelvis edematous appearance of the pancreas without focal area of hypoenhancement  July 2022: CT scan of the abdomen and pelvis showed acute interstitial pancreatitis  On this admission lipase was normal CMP was normal except an elevated glucose and hemoglobin was 8.8 g with a platelet count of 588 triglycerides were 93 but 3 weeks back was greater than 5000.  She underwent a CT scan of the abdomen on admission that showed gallbladder sludge small volume free fluid  within the right upper quadrant and a new fluid collection surrounding the body and extends laterally to the tail of the pancreas measuring 12 cm x 5 cm x 13 cm compatible with a pseudocyst a second fluid collection lateral to the head of the pancreas is extended to total of 8.2 cm x 2.9 x 1.8   On 08/27/2020 mild acute pancreatitis involving the head and mid body the pancreas was noted.   I have been asked to give my opinion about the pancreatic pseudocyst.  Presently has abdominal pain left side of the abdomen but she appeared comfortable sitting in her bed not having good bowel movements last bowel movement was a 2 days back was small in quantity.  Denies any nausea vomiting.  Not eating anything at this point of time as she is only on a liquid diet.    Past Medical History:  Diagnosis Date  . Diabetes mellitus without complication (HCC)   . Hypertriglyceridemia   . Pancreatitis     Past Surgical History:  Procedure Laterality Date  . ABDOMINAL HYSTERECTOMY    . LAPAROSCOPIC UNILATERAL SALPINGO OOPHERECTOMY Right     Prior to Admission medications   Medication Sig Start Date End Date Taking? Authorizing Provider  atorvastatin (LIPITOR) 20 MG tablet Take 20 mg by mouth at bedtime. 06/26/17   [provider]  fenofibrate 54 MG tablet Take 1 tablet (54 mg total) by mouth daily. 03/01/20   Swayze, Ava, DO  gabapentin (NEURONTIN) 100 MG capsule Take 100 mg by mouth 3 (  three) times daily. 08/24/19   [provider]  insulin aspart (NOVOLOG FLEXPEN) 100 UNIT/ML FlexPen Inject 5-15 Units into the skin 3 (three) times daily with meals. Uses sliding scale 10/16/19   Meredeth Ide, MD  LANTUS 100 UNIT/ML injection Inject 0.16 mLs (16 Units total) into the skin at bedtime. 10/16/19   Meredeth Ide, MD  metFORMIN (GLUCOPHAGE) 500 MG tablet Take 1,000 mg by mouth 2 (two) times daily. 07/24/19   [provider]  Omega-3 1000 MG CAPS Take 1,000 mg by mouth in the morning and at  bedtime. 02/22/20 02/21/21  [provider]  oxyCODONE (OXY IR/ROXICODONE) 5 MG immediate release tablet Take 1 tablet (5 mg total) by mouth every 4 (four) hours as needed for moderate pain. 02/29/20   Swayze, Ava, DO  pantoprazole (PROTONIX) 40 MG tablet Take 1 tablet (40 mg total) by mouth daily. 02/29/20 02/28/21  Swayze, Ava, DO  polyethylene glycol (MIRALAX / GLYCOLAX) 17 g packet Take 17 g by mouth daily. 03/01/20   Swayze, Ava, DO    Family History  Problem Relation Age of Onset  . Diabetes Mellitus II Maternal Grandmother   . Diabetes Mellitus II Sister      Social History   Tobacco Use  . Smoking status: Never  . Smokeless tobacco: Never  Vaping Use  . Vaping Use: Never used  Substance Use Topics  . Alcohol use: No  . Drug use: No    Allergies as of 09/18/2020 - Review Complete 09/18/2020  Allergen Reaction Noted  . Latex Rash 06/28/2017  . Penicillin g Rash 04/29/2014  . Penicillins Rash and Other (See Comments) 10/21/2014    Review of Systems:    All systems reviewed and negative except where noted in HPI.   Physical Exam:  Vital signs in last 24 hours: Temp:  [97.9 F (36.6 C)-98.6 F (37 C)] 98.6 F (37 C) (07/27 0747) Pulse Rate:  [60-93] 60 (07/27 0747) Resp:  [12-23] 18 (07/27 0747) BP: (94-115)/(64-83) 100/64 (07/27 0747) SpO2:  [97 %-100 %] 99 % (07/27 0747) Weight:  [59 kg] 59 kg (07/26 1444) Last BM Date: 09/18/20 General:   Pleasant, cooperative in NAD Head:  Normocephalic and atraumatic. Eyes:   No icterus.   Conjunctiva pink. PERRLA. Ears:  Normal auditory acuity. Neck:  Supple; no masses or thyroidomegaly Lungs: Respirations even and unlabored. Lungs clear  Psych:  Alert and cooperative. Normal affect.  LAB RESULTS: Recent Labs    09/18/20 1446 09/19/20 0419  WBC 6.0 5.4  HGB 8.8* 8.4*  HCT 28.5* 26.2*  PLT 588* 480*   BMET Recent Labs    09/18/20 1446 09/19/20 0419  NA 138 139  K 3.6 3.3*  CL 105 110  CO2 23 23   GLUCOSE 132* 72  BUN 10 7  CREATININE 0.45 0.41*  CALCIUM 9.0 8.3*   LFT Recent Labs    09/19/20 0419  PROT 6.8  ALBUMIN 3.1*  AST 23  ALT 14  ALKPHOS 37*  BILITOT 0.4   PT/INR No results for input(s): LABPROT, INR in the last 72 hours.  STUDIES: DG Chest 2 View  Result Date: 09/18/2020 CLINICAL DATA:  Abdominal and chest pain for 4 weeks. EXAM: CHEST - 2 VIEW COMPARISON:  PA and lateral chest 02/22/2020. FINDINGS: Small left pleural effusion and basilar airspace disease are seen. No right effusion. The right lung is clear. Heart size is normal. No pneumothorax. No acute or focal bony abnormality. IMPRESSION: Small left pleural effusion  and basilar airspace disease which is likely atelectasis. Electronically Signed   By: Drusilla Kanner M.D.   On: 09/18/2020 18:57   CT ABDOMEN PELVIS W CONTRAST  Result Date: 09/18/2020 CLINICAL DATA:  Abdominal and chest pain for approximately 4 weeks. The patient was diagnosed with pancreatitis and admitted to another hospital 3 weeks ago. EXAM: CT ABDOMEN AND PELVIS WITH CONTRAST TECHNIQUE: Multidetector CT imaging of the abdomen and pelvis was performed using the standard protocol following bolus administration of intravenous contrast. CONTRAST:  75 mL OMNIPAQUE IOHEXOL 300 MG/ML  SOLN COMPARISON:  CT abdomen and pelvis 08/27/2020. FINDINGS: Lower chest: The patient has a new small left pleural effusion and associated compressive left basilar atelectasis. No right effusion or pericardial effusion. Imaged right lung is clear. Hepatobiliary: No focal liver abnormality is seen. No gallstones, gallbladder wall thickening, or biliary dilatation. Pancreas: A new fluid collection which surrounds the body and extends lateral to the tail of the pancreas measures approximately 12 cm craniocaudal by 5 cm AP by 13 cm transverse and is compatible with a pseudocyst. A second fluid collection lateral to the head of the pancreas is extends a total of 8.2 cm  craniocaudal and measures up to 2.9 by 1.8 cm in the axial plane. The pancreas enhances homogeneously. Spleen: Normal in size without focal abnormality. Adrenals/Urinary Tract: Adrenal glands are unremarkable. Kidneys are normal, without renal calculi, focal lesion, or hydronephrosis. Bladder is unremarkable. Stomach/Bowel: Stomach is within normal limits. No evidence of appendicitis. No evidence of bowel wall thickening, distention, or inflammatory changes. Vascular/Lymphatic: No significant vascular findings are present. No enlarged abdominal or pelvic lymph nodes. Reproductive: Status post hysterectomy. No adnexal masses. Other: None. Musculoskeletal: Negative. IMPRESSION: Two new fluid collections about the pancreas described above are consistent with pseudocysts. The pancreas appears normal. Negative for pancreatic necrosis. Small left pleural effusion and associated compressive atelectasis are new since the prior CT. Electronically Signed   By: Drusilla Kanner M.D.   On: 09/18/2020 17:08   US ABDOMEN LIMITED RUQ (LIVER/GB)  Result Date: 09/19/2020 CLINICAL DATA:  Initial evaluation for acute pancreatitis. EXAM: ULTRASOUND ABDOMEN LIMITED RIGHT UPPER QUADRANT COMPARISON:  CT from earlier the same day. FINDINGS: Gallbladder: Small amount of echogenic sludge seen layering within the gallbladder lumen. No frank cholelithiasis. Small 3 mm polyp seen adherent to the gallbladder wall. Gallbladder wall measures within normal limits at 1.4 mm. No sonographic Murphy sign elicited on exam. Common bile duct: Diameter: 4.3 mm Liver: No focal lesion identified. Diffusely increased echogenicity within the hepatic parenchyma. Portal vein is patent on color Doppler imaging with normal direction of blood flow towards the liver. Other: Scattered small volume free fluid present within the right upper quadrant. IMPRESSION: 1. Gallbladder sludge without cholelithiasis or evidence for acute cholecystitis. No biliary dilatation.  2. Diffusely increased echogenicity within the hepatic parenchyma, suggesting steatosis. 3. Small volume free fluid within the right upper quadrant. 4. Incidental 3 mm gallbladder polyp, almost certainly benign given size. No follow-up imaging recommended. Electronically Signed   By: Rise Mu M.D.   On: 09/19/2020 01:20      Impression / Plan:   Valina Maes is a 45 y.o. 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  At that time the triglycerides are greater than 5000.  She comes back to the emergency room with abdominal pain  and found to have a pseudocyst on the CT scan for which I have been consulted.  No evidence of acute pancreatitis either radiologically or biochemically at this point of time.  Hemoglobin is at 8.8 g with no overt blood loss.  Plan 1.  In terms of the pseudocyst I would not suggest any intervention at this point of time unless the patient shows features of fever or severe abdominal pain which we can attribute to the pseudocyst.  Usually a pseudocyst resolves in a few weeks but if it persists and develops a thick-walled and if felt to be causing compressive or pain symptoms can then consider drainage at that point of time  2.  I would suggest to continue enteral nutrition and supportive care in terms of pain management.  3.  I would not suggest checking any stool occult for testing as it is a test for colon cancer screening and it is inappropriate in this patient in the setting.  The best way to look for bleeding is to have the patient if they are having any blood in the stool or having melena or hematemesis correlated with the CBC  4.  She is on opiates and it could cause constipation and suggest to place her on a bowel regimen which would help  regular bowel movements and relieve any abdominal discomfort from the same.  Thank you for involving me in the care of this patient.      LOS: 1 day   Sabrina MoodKiran Jemel Ono, MD  09/19/2020, 9:03 AM

## 2020-09-19 NOTE — ED Notes (Signed)
Patient to inpatient unit.  

## 2020-09-19 NOTE — Progress Notes (Signed)
PROGRESS NOTE    Sabrina Hanna  QQP:[id number] DOB: Oct 19, 1975 DOA: 09/18/2020 PCP: Leotis Shames, MD   Brief Narrative:  45 year old female recent admission to [facility] for severe pancreatitis secondary to elevated triglycerides.  Status post plasmapheresis at [facility].  Presents to [facility] 1 week after discharge from [facility] with complaints of persistent abdominal and chest pain.  Imaging demonstrates pancreatic pseudocyst formation new from prior.  Patient was admitted for pain control.  GI consulted.  Per GI no intervention necessary for pancreatic pseudocyst.  If cyst wall is not mature than drain placement is not an option.  Patient is nonseptic and nontoxic appearing.  Apparently she was accepted by [facility] for transfer however they have no available bed so she was admitted to [facility] under hospitalist service.   Assessment & Plan:   Principal Problem:   Abdominal pain Active Problems:   HLD (hyperlipidemia)   GERD (gastroesophageal reflux disease)   Uncontrolled diabetes mellitus (HCC)   Acute pancreatitis without necrosis or infection, unspecified  Acute abdominal pain Pancreatic pseudocyst No radiographic evidence of acute pancreatitis Lipase and triglyceride level reassuring GI consulted No intervention suggested for pseudocyst Possible contribution from mild constipation secondary to opioids Plan: No invasive intervention for pseudocyst recommended Pain control IV fluids Diet advance as tolerated Bowel regimen, avoid constipation DC stool occult  GERD PPI  Type 2 diabetes mellitus with hyperglycemia Sliding scale Repeat hemoglobin A1c Hold home metformin Consider addition of long-acting insulin once patient able to tolerate more p.o.  Hyperlipidemia Statin currently on hold  Acute on chronic anemia Unclear source No evidence of blood loss Normal coagulation studies Possible iron deficiency versus B12 deficiency Plan: Anemia work-up  Chest pain Do not suspect  ACS VQ scan and echocardiogram have been ordered on admission Will follow results       DVT prophylaxis: SCD Code Status: Full Family Communication: None today Disposition Plan: Status is: Inpatient  Remains inpatient appropriate because:Inpatient level of care appropriate due to severity of illness  Dispo: The patient is from: Home              Anticipated d/c is to: Home              Patient currently is not medically stable to d/c.   Difficult to place patient No  Patient with acute on chronic abdominal pain associated with pancreatic pseudocyst.  No radiographic evidence of acute pancreatitis.  Patient was apparently accepted by [facility] and placed on the wait list for transfer.  Will attempt to medically optimize and if patient continues to need transfer we reach out to the transfer center     Level of care: Med-Surg  Consultants:  GI, signed off  Procedures:  None  Antimicrobials:  None   Subjective: Seen and examined.  Interview conducted in [place].  Patient in mild distress due to pain.  Endorses pain all over her belly.  Unable to localize.  Objective: Vitals:   09/18/20 2200 09/19/20 0120 09/19/20 0500 09/19/20 0747  BP: 97/65 94/68 96/73  100/64  Pulse: 83 62 65 60  Resp: 18 20 18 18   Temp:  97.9 F (36.6 C) 97.9 F (36.6 C) 98.6 F (37 C)  TempSrc:      SpO2: 99% 100% 97% 99%  Weight:      Height:        Intake/Output Summary (Last 24 hours) at 09/19/2020 1343 Last data filed at 09/19/2020 1027 Gross per 24 hour  Intake 1697.75 ml  Output --  Net  1697.75 ml   Filed Weights   09/18/20 1444  Weight: 59 kg    Examination:  General exam: Mild distress due to pain Respiratory system: Clear to auscultation. Respiratory effort normal. Cardiovascular system: S1 & S2 heard, RRR. No JVD, murmurs, rubs, gallops or clicks. No pedal edema. Gastrointestinal system: Soft, nondistended, mild tender to palpation, normal bowel sounds Central nervous  system: Alert and oriented. No focal neurological deficits. Extremities: Symmetric 5 x 5 power. Skin: No rashes, lesions or ulcers Psychiatry: Judgement and insight appear normal. Mood & affect appropriate.     Data Reviewed: I have personally reviewed following labs and imaging studies  CBC: Recent Labs  Lab 09/18/20 1446 09/19/20 0419  WBC 6.0 5.4  HGB 8.8* 8.4*  HCT 28.5* 26.2*  MCV 91.3 91.3  PLT 588* 480*   Basic Metabolic Panel: Recent Labs  Lab 09/18/20 1446 09/19/20 0419  NA 138 139  K 3.6 3.3*  CL 105 110  CO2 23 23  GLUCOSE 132* 72  BUN 10 7  CREATININE 0.45 0.41*  CALCIUM 9.0 8.3*   GFR: Estimated Creatinine Clearance: 72.9 mL/min (A) (by C-G formula based on SCr of 0.41 mg/dL (L)). Liver Function Tests: Recent Labs  Lab 09/18/20 1446 09/19/20 0419  AST 29 23  ALT 16 14  ALKPHOS 41 37*  BILITOT 0.6 0.4  PROT 8.1 6.8  ALBUMIN 3.7 3.1*   Recent Labs  Lab 09/18/20 1446  LIPASE 40   No results for input(s): AMMONIA in the last 168 hours. Coagulation Profile: No results for input(s): INR, PROTIME in the last 168 hours. Cardiac Enzymes: No results for input(s): CKTOTAL, CKMB, CKMBINDEX, TROPONINI in the last 168 hours. BNP (last 3 results) No results for input(s): PROBNP in the last 8760 hours. HbA1C: No results for input(s): HGBA1C in the last 72 hours. CBG: Recent Labs  Lab 09/19/20 0742 09/19/20 1128  GLUCAP 71 85   Lipid Profile: Recent Labs    09/18/20 1645  TRIG 93   Thyroid Function Tests: No results for input(s): TSH, T4TOTAL, FREET4, T3FREE, THYROIDAB in the last 72 hours. Anemia Panel: No results for input(s): VITAMINB12, FOLATE, FERRITIN, TIBC, IRON, RETICCTPCT in the last 72 hours. Sepsis Labs: No results for input(s): PROCALCITON, LATICACIDVEN in the last 168 hours.  Recent Results (from the past 240 hour(s))  Resp Panel by RT-PCR (Flu A&B, Covid) Nasopharyngeal Swab     Status: None   Collection Time: 09/18/20  10:02 PM   Specimen: Nasopharyngeal Swab; Nasopharyngeal(NP) swabs in vial transport medium  Result Value Ref Range Status   SARS Coronavirus 2 by RT PCR NEGATIVE NEGATIVE Final    Comment: (NOTE) SARS-CoV-2 target nucleic acids are NOT DETECTED.  The SARS-CoV-2 RNA is generally detectable in upper respiratory specimens during the acute phase of infection. The lowest concentration of SARS-CoV-2 viral copies this assay can detect is 138 copies/mL. A negative result does not preclude SARS-Cov-2 infection and should not be used as the sole basis for treatment or other patient management decisions. A negative result may occur with  improper specimen collection/handling, submission of specimen other than nasopharyngeal swab, presence of viral mutation(s) within the areas targeted by this assay, and inadequate number of viral copies(<138 copies/mL). A negative result must be combined with clinical observations, patient history, and epidemiological information. The expected result is Negative.  Fact Sheet for Patients:  BloggerCourse.com  Fact Sheet for Healthcare Providers:  SeriousBroker.it  This test is no t yet approved or cleared by the [place]  States FDA and  has been authorized for detection and/or diagnosis of SARS-CoV-2 by FDA under an Emergency Use Authorization (EUA). This EUA will remain  in effect (meaning this test can be used) for the duration of the COVID-19 declaration under Section 564(b)(1) of the Act, 21 U.S.C.section 360bbb-3(b)(1), unless the authorization is terminated  or revoked sooner.       Influenza A by PCR NEGATIVE NEGATIVE Final   Influenza B by PCR NEGATIVE NEGATIVE Final    Comment: (NOTE) The Xpert Xpress SARS-CoV-2/FLU/RSV plus assay is intended as an aid in the diagnosis of influenza from Nasopharyngeal swab specimens and should not be used as a sole basis for treatment. Nasal washings and aspirates  are unacceptable for Xpert Xpress SARS-CoV-2/FLU/RSV testing.  Fact Sheet for Patients: BloggerCourse.com  Fact Sheet for Healthcare Providers: SeriousBroker.it  This test is not yet approved or cleared by the [place] FDA and has been authorized for detection and/or diagnosis of SARS-CoV-2 by FDA under an Emergency Use Authorization (EUA). This EUA will remain in effect (meaning this test can be used) for the duration of the COVID-19 declaration under Section 564(b)(1) of the Act, 21 U.S.C. section 360bbb-3(b)(1), unless the authorization is terminated or revoked.  Performed at [facility], [address], [place], [place] [address]          Radiology Studies: DG Chest 2 View  Result Date: 09/18/2020 CLINICAL DATA:  Abdominal and chest pain for 4 weeks. EXAM: CHEST - 2 VIEW COMPARISON:  PA and lateral chest 02/22/2020. FINDINGS: Small left pleural effusion and basilar airspace disease are seen. No right effusion. The right lung is clear. Heart size is normal. No pneumothorax. No acute or focal bony abnormality. IMPRESSION: Small left pleural effusion and basilar airspace disease which is likely atelectasis. Electronically Signed   By: Drusilla Kanner M.D.   On: 09/18/2020 18:57   CT ABDOMEN PELVIS W CONTRAST  Result Date: 09/18/2020 CLINICAL DATA:  Abdominal and chest pain for approximately 4 weeks. The patient was diagnosed with pancreatitis and admitted to another hospital 3 weeks ago. EXAM: CT ABDOMEN AND PELVIS WITH CONTRAST TECHNIQUE: Multidetector CT imaging of the abdomen and pelvis was performed using the standard protocol following bolus administration of intravenous contrast. CONTRAST:  75 mL OMNIPAQUE IOHEXOL 300 MG/ML  SOLN COMPARISON:  CT abdomen and pelvis 08/27/2020. FINDINGS: Lower chest: The patient has a new small left pleural effusion and associated compressive left basilar atelectasis. No right  effusion or pericardial effusion. Imaged right lung is clear. Hepatobiliary: No focal liver abnormality is seen. No gallstones, gallbladder wall thickening, or biliary dilatation. Pancreas: A new fluid collection which surrounds the body and extends lateral to the tail of the pancreas measures approximately 12 cm craniocaudal by 5 cm AP by 13 cm transverse and is compatible with a pseudocyst. A second fluid collection lateral to the head of the pancreas is extends a total of 8.2 cm craniocaudal and measures up to 2.9 by 1.8 cm in the axial plane. The pancreas enhances homogeneously. Spleen: Normal in size without focal abnormality. Adrenals/Urinary Tract: Adrenal glands are unremarkable. Kidneys are normal, without renal calculi, focal lesion, or hydronephrosis. Bladder is unremarkable. Stomach/Bowel: Stomach is within normal limits. No evidence of appendicitis. No evidence of bowel wall thickening, distention, or inflammatory changes. Vascular/Lymphatic: No significant vascular findings are present. No enlarged abdominal or pelvic lymph nodes. Reproductive: Status post hysterectomy. No adnexal masses. Other: None. Musculoskeletal: Negative. IMPRESSION: Two new fluid collections about the pancreas described  above are consistent with pseudocysts. The pancreas appears normal. Negative for pancreatic necrosis. Small left pleural effusion and associated compressive atelectasis are new since the prior CT. Electronically Signed   By: Drusilla Kannerhomas  Dalessio M.D.   On: 09/18/2020 17:08   DG Chest Port 1 View  Result Date: 09/19/2020 CLINICAL DATA:  Chest pain.  Shortness of breath. EXAM: PORTABLE CHEST 1 VIEW COMPARISON:  09/18/2020. FINDINGS: Mediastinum is normal. Mild cardiomegaly. Interim progression of left mid and left base lung infiltrates. Stable small left pleural effusion. No pneumothorax. IMPRESSION: 1. Interim progression of left mid and left lung base infiltrates. Stable small left pleural effusion. 2.  Mild  cardiomegaly. Electronically Signed   By: Maisie Fushomas  Register   On: 09/19/2020 11:24   US ABDOMEN LIMITED RUQ (LIVER/GB)  Result Date: 09/19/2020 CLINICAL DATA:  Initial evaluation for acute pancreatitis. EXAM: ULTRASOUND ABDOMEN LIMITED RIGHT UPPER QUADRANT COMPARISON:  CT from earlier the same day. FINDINGS: Gallbladder: Small amount of echogenic sludge seen layering within the gallbladder lumen. No frank cholelithiasis. Small 3 mm polyp seen adherent to the gallbladder wall. Gallbladder wall measures within normal limits at 1.4 mm. No sonographic Murphy sign elicited on exam. Common bile duct: Diameter: 4.3 mm Liver: No focal lesion identified. Diffusely increased echogenicity within the hepatic parenchyma. Portal vein is patent on color Doppler imaging with normal direction of blood flow towards the liver. Other: Scattered small volume free fluid present within the right upper quadrant. IMPRESSION: 1. Gallbladder sludge without cholelithiasis or evidence for acute cholecystitis. No biliary dilatation. 2. Diffusely increased echogenicity within the hepatic parenchyma, suggesting steatosis. 3. Small volume free fluid within the right upper quadrant. 4. Incidental 3 mm gallbladder polyp, almost certainly benign given size. No follow-up imaging recommended. Electronically Signed   By: Rise MuBenjamin  McClintock M.D.   On: 09/19/2020 01:20        Scheduled Meds:  gabapentin  300 mg Oral TID   insulin aspart  0-15 Units Subcutaneous TID WC   pantoprazole (PROTONIX) IV  40 mg Intravenous Q24H   senna-docusate  1 tablet Oral BID   Continuous Infusions:  sodium chloride 150 mL/hr at 09/19/20 1012     LOS: 1 day    Time spent: 35 minutes    Tresa MooreSudheer B Ferrel Simington, MD [facility] Pager [phone]  If 7PM-7AM, please contact night-coverage 09/19/2020, 1:43 PM

## 2020-09-19 NOTE — Plan of Care (Signed)

## 2020-09-19 NOTE — Discharge Summary (Deleted)
Physician Discharge Summary  Sabrina Hanna ZOX:[id number] DOB: [birth year] DOA: 09/18/2020  PCP: Leotis Shames, MD  Admit date: 09/18/2020 Discharge date: 09/19/2020  Admitted From: Home Disposition: [facility] Health: N/AA.  Patient transferring to another short-term hospital Equipment/Devices: None  Discharge Condition: Stable CODE STATUS: Full Diet recommendation: Soft  Brief/Interim Summary:  45 [facility] secondary to elevated triglycerides.  Status post plasmapheresis at [facility].  Presents to [facility] 1 week after discharge from [facility] with complaints of persistent abdominal and chest pain.  Imaging demonstrates pancreatic pseudocyst formation new from prior.  Patient was admitted for pain control.  GI consulted.   Per GI no intervention necessary for pancreatic pseudocyst.  If cyst wall is not mature than drain placement is not an option.  Patient is nonseptic and nontoxic appearing.  Apparently she was accepted by [facility] for transfer however they have no available bed so she was admitted to [facility] under hospitalist service.  Patient was placed on the wait list for transfer to [facility] and in the meantime admitted under hospitalist service at [facility].  No intervention was recommended by our gastroenterologist for pancreatic pseudocyst.  Patient does endorse severe diffuse abdominal pain, nausea, inability to tolerate p.o.  She will be transferred to [facility] once bed is available  Discharge Diagnoses:  Principal Problem:   Abdominal pain Active Problems:   HLD (hyperlipidemia)   GERD (gastroesophageal reflux disease)   Uncontrolled diabetes mellitus (HCC)   Acute pancreatitis without necrosis or infection, unspecified  Acute abdominal pain Pancreatic pseudocyst No radiographic evidence of acute pancreatitis Lipase and triglyceride level reassuring GI consulted No intervention suggested for pseudocyst Possible  contribution from mild constipation secondary to opioids Plan: Transfer to [facility] for continued management of symptoms of pancreatitis associated with new formation of pseudocyst.   GERD PPI   Type 2 diabetes mellitus with hyperglycemia Metformin was held on admission to [facility].  Sliding scale insulin was initiated.  Blood sugars well controlled   Hyperlipidemia Statin currently on hold   Acute on chronic anemia Unclear source No evidence of blood loss Normal coagulation studies Possible iron deficiency versus B12 deficiency Plan: Anemia work-up   Chest pain Low suspicion of ACS.  Echocardiogram and VQ scan ordered and results are reassuring.  Discharge Instructions  Discharge Instructions     Diet - low sodium heart healthy   Complete by: As directed    Increase activity slowly   Complete by: As directed       Allergies as of 09/19/2020       Reactions   Latex Rash   Penicillin G Rash   Penicillins Rash, Other (See Comments)   Has patient had a PCN reaction causing immediate rash, facial/tongue/throat swelling, SOB or lightheadedness with hypotension: No Has patient had a PCN reaction causing severe rash involving mucus membranes or skin necrosis: No Has patient had a PCN reaction that required hospitalization No Has patient had a PCN reaction occurring within the last 10 years: No If all of the above answers are "NO", then may proceed with Cephalosporin use.        Medication List     TAKE these medications    atorvastatin 20 MG tablet Commonly known as: LIPITOR Take 20 mg by mouth at bedtime.   fenofibrate 54 MG tablet Take 1 tablet (54 mg total) by mouth daily.   gabapentin 100 MG capsule Commonly known as: NEURONTIN Take 100 mg by mouth  3 (three) times daily.   Lantus 100 UNIT/ML injection Generic drug: insulin glargine Inject 0.16 mLs (16 Units total) into the skin at bedtime.   metFORMIN 500 MG tablet Commonly known as: GLUCOPHAGE Take 1,000  mg by mouth 2 (two) times daily.   NovoLOG FlexPen 100 UNIT/ML FlexPen Generic drug: insulin aspart Inject 5-15 Units into the skin 3 (three) times daily with meals. Uses sliding scale   Omega-3 1000 MG Caps Take 1,000 mg by mouth in the morning and at bedtime.   oxyCODONE 5 MG immediate release tablet Commonly known as: Oxy IR/ROXICODONE Take 1 tablet (5 mg total) by mouth every 4 (four) hours as needed for moderate pain.   pantoprazole 40 MG tablet Commonly known as: Protonix Take 1 tablet (40 mg total) by mouth daily.   polyethylene glycol 17 g packet Commonly known as: MIRALAX / GLYCOLAX Take 17 g by mouth daily.        Allergies  Allergen Reactions   Latex Rash   Penicillin G Rash   Penicillins Rash and Other (See Comments)    Has patient had a PCN reaction causing immediate rash, facial/tongue/throat swelling, SOB or lightheadedness with hypotension: No Has patient had a PCN reaction causing severe rash involving mucus membranes or skin necrosis: No Has patient had a PCN reaction that required hospitalization No Has patient had a PCN reaction occurring within the last 10 years: No If all of the above answers are "NO", then may proceed with Cephalosporin use.     Consultations: GI   Procedures/Studies: DG Chest 2 View  Result Date: 09/18/2020 CLINICAL DATA:  Abdominal and chest pain for 4 weeks. EXAM: CHEST - 2 VIEW COMPARISON:  PA and lateral chest 02/22/2020. FINDINGS: Small left pleural effusion and basilar airspace disease are seen. No right effusion. The right lung is clear. Heart size is normal. No pneumothorax. No acute or focal bony abnormality. IMPRESSION: Small left pleural effusion and basilar airspace disease which is likely atelectasis. Electronically Signed   By: Drusilla Kanner M.D.   On: 09/18/2020 18:57   NM Pulmonary Perfusion  Result Date: 09/19/2020 CLINICAL DATA:  Shortness of breath and chest pain. EXAM: NUCLEAR MEDICINE PERFUSION LUNG SCAN  TECHNIQUE: Perfusion images were obtained in multiple projections after intravenous injection of radiopharmaceutical. Ventilation scans intentionally deferred if perfusion scan and chest x-ray adequate for interpretation during COVID 19 epidemic. RADIOPHARMACEUTICALS:  4.46 mCi Tc-59m MAA IV COMPARISON:  Chest radiograph from 09/19/2020. FINDINGS: No peripheral segmental wedge-shaped perfusion defects identified to suggest acute pulmonary embolus. Asymmetric, nonsegmental decreased tracer activity localizing to the left lower lobe corresponds to left pleural effusion identified on chest radiograph from today. IMPRESSION: 1. No evidence for acute pulmonary embolus. Electronically Signed   By: Signa Kell M.D.   On: 09/19/2020 15:36   CT ABDOMEN PELVIS W CONTRAST  Result Date: 09/18/2020 CLINICAL DATA:  Abdominal and chest pain for approximately 4 weeks. The patient was diagnosed with pancreatitis and admitted to another hospital 3 weeks ago. EXAM: CT ABDOMEN AND PELVIS WITH CONTRAST TECHNIQUE: Multidetector CT imaging of the abdomen and pelvis was performed using the standard protocol following bolus administration of intravenous contrast. CONTRAST:  75 mL OMNIPAQUE IOHEXOL 300 MG/ML  SOLN COMPARISON:  CT abdomen and pelvis 08/27/2020. FINDINGS: Lower chest: The patient has a new small left pleural effusion and associated compressive left basilar atelectasis. No right effusion or pericardial effusion. Imaged right lung is clear. Hepatobiliary: No focal liver abnormality is seen. No gallstones, gallbladder wall  thickening, or biliary dilatation. Pancreas: A new fluid collection which surrounds the body and extends lateral to the tail of the pancreas measures approximately 12 cm craniocaudal by 5 cm AP by 13 cm transverse and is compatible with a pseudocyst. A second fluid collection lateral to the head of the pancreas is extends a total of 8.2 cm craniocaudal and measures up to 2.9 by 1.8 cm in the axial plane.  The pancreas enhances homogeneously. Spleen: Normal in size without focal abnormality. Adrenals/Urinary Tract: Adrenal glands are unremarkable. Kidneys are normal, without renal calculi, focal lesion, or hydronephrosis. Bladder is unremarkable. Stomach/Bowel: Stomach is within normal limits. No evidence of appendicitis. No evidence of bowel wall thickening, distention, or inflammatory changes. Vascular/Lymphatic: No significant vascular findings are present. No enlarged abdominal or pelvic lymph nodes. Reproductive: Status post hysterectomy. No adnexal masses. Other: None. Musculoskeletal: Negative. IMPRESSION: Two new fluid collections about the pancreas described above are consistent with pseudocysts. The pancreas appears normal. Negative for pancreatic necrosis. Small left pleural effusion and associated compressive atelectasis are new since the prior CT. Electronically Signed   By: Drusilla Kannerhomas  Dalessio M.D.   On: 09/18/2020 17:08   CT ABDOMEN PELVIS W CONTRAST  Result Date: 08/27/2020 CLINICAL DATA:  Severe periumbilical and epigastric pain. Concern for pancreatitis. EXAM: CT ABDOMEN AND PELVIS WITH CONTRAST TECHNIQUE: Multidetector CT imaging of the abdomen and pelvis was performed using the standard protocol following bolus administration of intravenous contrast. CONTRAST:  100mL OMNIPAQUE IOHEXOL 300 MG/ML  SOLN COMPARISON:  CT 02/28/2020 02/26/2020 FINDINGS: Lower chest: Lung bases are clear. Hepatobiliary: Low-attenuation in the liver suggestive of hepatic steatosis gallbladder normal. No biliary duct dilatation. No radiodense gallstones. No gallbladder distension. Common bile duct is normal caliber. There is subtle enhancement of the mucosa of the common hepatic duct (image 32/series 4/coronal Pancreas: There is a rim of low-density fluid surrounding the head and mid body of the pancreas. There is no organized fluid collections. There is mild heterogeneous hypoenhancement of the head of the pancreas (image  37/2). Body and tail the pancreas enhance normally. There is no pancreatic duct dilatation. Subtle enhancement of the common bile duct in the head of the pancreas (image 35/2) No evidence of vascular complication associated with pancreatitis Spleen: Normal spleen Adrenals/urinary tract: Adrenal glands and kidneys are normal. The ureters and bladder normal. Stomach/Bowel: Stomach, small-bowel and cecum are normal. The appendix is not identified but there is no pericecal inflammation to suggest appendicitis. The colon and rectosigmoid colon are normal. Vascular/Lymphatic: Abdominal aorta is normal caliber. No periportal or retroperitoneal adenopathy. No pelvic adenopathy. Reproductive: Post hysterectomy.  Adnexa unremarkable Other: No free fluid the pelvis.  No ascites. Musculoskeletal: No aggressive osseous lesion. IMPRESSION: 1. Mild acute pancreatitis involving the head and mid body of the pancreas. No organized fluid collections. Mild hypoenhancement of head of pancreas; the body and tail of the pancreas enhance normally. 2. Mild mucosal enhancement of the common hepatic duct and common bile duct. Cannot exclude subtle cholangitis. No biliary duct dilatation. 3. Gallbladder normal by CT imaging.  No radiodense stones. 4. Hepatic steatosis Electronically Signed   By: Genevive BiStewart  Edmunds M.D.   On: 08/27/2020 10:05   DG Chest Port 1 View  Result Date: 09/19/2020 CLINICAL DATA:  Chest pain.  Shortness of breath. EXAM: PORTABLE CHEST 1 VIEW COMPARISON:  09/18/2020. FINDINGS: Mediastinum is normal. Mild cardiomegaly. Interim progression of left mid and left base lung infiltrates. Stable small left pleural effusion. No pneumothorax. IMPRESSION: 1. Interim progression of left  mid and left lung base infiltrates. Stable small left pleural effusion. 2.  Mild cardiomegaly. Electronically Signed   By: Maisie Fus  Register   On: 09/19/2020 11:24   US ABDOMEN LIMITED RUQ (LIVER/GB)  Result Date: 09/19/2020 CLINICAL DATA:   Initial evaluation for acute pancreatitis. EXAM: ULTRASOUND ABDOMEN LIMITED RIGHT UPPER QUADRANT COMPARISON:  CT from earlier the same day. FINDINGS: Gallbladder: Small amount of echogenic sludge seen layering within the gallbladder lumen. No frank cholelithiasis. Small 3 mm polyp seen adherent to the gallbladder wall. Gallbladder wall measures within normal limits at 1.4 mm. No sonographic Murphy sign elicited on exam. Common bile duct: Diameter: 4.3 mm Liver: No focal lesion identified. Diffusely increased echogenicity within the hepatic parenchyma. Portal vein is patent on color Doppler imaging with normal direction of blood flow towards the liver. Other: Scattered small volume free fluid present within the right upper quadrant. IMPRESSION: 1. Gallbladder sludge without cholelithiasis or evidence for acute cholecystitis. No biliary dilatation. 2. Diffusely increased echogenicity within the hepatic parenchyma, suggesting steatosis. 3. Small volume free fluid within the right upper quadrant. 4. Incidental 3 mm gallbladder polyp, almost certainly benign given size. No follow-up imaging recommended. Electronically Signed   By: Rise Mu M.D.   On: 09/19/2020 01:20   (Echo, Carotid, EGD, Colonoscopy, ERCP)    Subjective: Seen and examined.  At time of discharge patient is hemodynamically stable but still endorsing severe diffuse abdominal pain.  Discharge Exam: Vitals:   09/19/20 0747 09/19/20 1606  BP: 100/64 93/64  Pulse: 60 63  Resp: 18 20  Temp: 98.6 F (37 C) 98.2 F (36.8 C)  SpO2: 99% 97%   Vitals:   09/19/20 0120 09/19/20 0500 09/19/20 0747 09/19/20 1606  BP: 94/68 96/73 100/64 93/64  Pulse: 62 65 60 63  Resp: 20 18 18 20   Temp: 97.9 F (36.6 C) 97.9 F (36.6 C) 98.6 F (37 C) 98.2 F (36.8 C)  TempSrc:      SpO2: 100% 97% 99% 97%  Weight:      Height:        General: Pt is alert, awake, mild distress due to pain Cardiovascular: RRR, S1/S2 +, no rubs, no  gallops Respiratory: CTA bilaterally, no wheezing, no rhonchi Abdominal: Soft, nondistended, diffuse tender to palpation, positive bowel sounds Extremities: no edema, no cyanosis    The results of significant diagnostics from this hospitalization (including imaging, microbiology, ancillary and laboratory) are listed below for reference.     Microbiology: Recent Results (from the past 240 hour(s))  Resp Panel by RT-PCR (Flu A&B, Covid) Nasopharyngeal Swab     Status: None   Collection Time: 09/18/20 10:02 PM   Specimen: Nasopharyngeal Swab; Nasopharyngeal(NP) swabs in vial transport medium  Result Value Ref Range Status   SARS Coronavirus 2 by RT PCR NEGATIVE NEGATIVE Final    Comment: (NOTE) SARS-CoV-2 target nucleic acids are NOT DETECTED.  The SARS-CoV-2 RNA is generally detectable in upper respiratory specimens during the acute phase of infection. The lowest concentration of SARS-CoV-2 viral copies this assay can detect is 138 copies/mL. A negative result does not preclude SARS-Cov-2 infection and should not be used as the sole basis for treatment or other patient management decisions. A negative result may occur with  improper specimen collection/handling, submission of specimen other than nasopharyngeal swab, presence of viral mutation(s) within the areas targeted by this assay, and inadequate number of viral copies(<138 copies/mL). A negative result must be combined with clinical observations, patient history, and epidemiological information. The  expected result is Negative.  Fact Sheet for Patients:  BloggerCourse.com  Fact Sheet for Healthcare Providers:  SeriousBroker.it  This test is no t yet approved or cleared by the [place] FDA and  has been authorized for detection and/or diagnosis of SARS-CoV-2 by FDA under an Emergency Use Authorization (EUA). This EUA will remain  in effect (meaning this test can be  used) for the duration of the COVID-19 declaration under Section 564(b)(1) of the Act, 21 U.S.C.section 360bbb-3(b)(1), unless the authorization is terminated  or revoked sooner.       Influenza A by PCR NEGATIVE NEGATIVE Final   Influenza B by PCR NEGATIVE NEGATIVE Final    Comment: (NOTE) The Xpert Xpress SARS-CoV-2/FLU/RSV plus assay is intended as an aid in the diagnosis of influenza from Nasopharyngeal swab specimens and should not be used as a sole basis for treatment. Nasal washings and aspirates are unacceptable for Xpert Xpress SARS-CoV-2/FLU/RSV testing.  Fact Sheet for Patients: BloggerCourse.com  Fact Sheet for Healthcare Providers: SeriousBroker.it  This test is not yet approved or cleared by the [place] FDA and has been authorized for detection and/or diagnosis of SARS-CoV-2 by FDA under an Emergency Use Authorization (EUA). This EUA will remain in effect (meaning this test can be used) for the duration of the COVID-19 declaration under Section 564(b)(1) of the Act, 21 U.S.C. section 360bbb-3(b)(1), unless the authorization is terminated or revoked.  Performed at [facility], [address] [place], [place] [address]      Labs: BNP (last 3 results) Recent Labs    10/12/19 0300  BNP 11.8   Basic Metabolic Panel: Recent Labs  Lab 09/18/20 1446 09/19/20 0419  NA 138 139  K 3.6 3.3*  CL 105 110  CO2 23 23  GLUCOSE 132* 72  BUN 10 7  CREATININE 0.45 0.41*  CALCIUM 9.0 8.3*   Liver Function Tests: Recent Labs  Lab 09/18/20 1446 09/19/20 0419  AST 29 23  ALT 16 14  ALKPHOS 41 37*  BILITOT 0.6 0.4  PROT 8.1 6.8  ALBUMIN 3.7 3.1*   Recent Labs  Lab 09/18/20 1446  LIPASE 40   No results for input(s): AMMONIA in the last 168 hours. CBC: Recent Labs  Lab 09/18/20 1446 09/19/20 0419  WBC 6.0 5.4  HGB 8.8* 8.4*  HCT 28.5* 26.2*  MCV 91.3 91.3  PLT 588* 480*   Cardiac  Enzymes: No results for input(s): CKTOTAL, CKMB, CKMBINDEX, TROPONINI in the last 168 hours. BNP: Invalid input(s): POCBNP CBG: Recent Labs  Lab 09/19/20 0742 09/19/20 1128 09/19/20 1614  GLUCAP 71 85 100*   D-Dimer Recent Labs    09/19/20 0419  DDIMER 3.87*   Hgb A1c No results for input(s): HGBA1C in the last 72 hours. Lipid Profile Recent Labs    09/18/20 1645  TRIG 93   Thyroid function studies No results for input(s): TSH, T4TOTAL, T3FREE, THYROIDAB in the last 72 hours.  Invalid input(s): FREET3 Anemia work up Recent Labs    09/19/20 0419  FOLATE 12.5  FERRITIN 192  TIBC 322  IRON 42   Urinalysis    Component Value Date/Time   COLORURINE YELLOW (A) 09/18/2020 1446   APPEARANCEUR HAZY (A) 09/18/2020 1446   LABSPEC 1.028 09/18/2020 1446   PHURINE 5.0 09/18/2020 1446   GLUCOSEU NEGATIVE 09/18/2020 1446   HGBUR NEGATIVE 09/18/2020 1446   BILIRUBINUR NEGATIVE 09/18/2020 1446   KETONESUR 5 (A) 09/18/2020 1446   PROTEINUR 30 (A) 09/18/2020 1446   NITRITE  NEGATIVE 09/18/2020 1446   LEUKOCYTESUR NEGATIVE 09/18/2020 1446   Sepsis Labs Invalid input(s): PROCALCITONIN,  WBC,  LACTICIDVEN Microbiology Recent Results (from the past 240 hour(s))  Resp Panel by RT-PCR (Flu A&B, Covid) Nasopharyngeal Swab     Status: None   Collection Time: 09/18/20 10:02 PM   Specimen: Nasopharyngeal Swab; Nasopharyngeal(NP) swabs in vial transport medium  Result Value Ref Range Status   SARS Coronavirus 2 by RT PCR NEGATIVE NEGATIVE Final    Comment: (NOTE) SARS-CoV-2 target nucleic acids are NOT DETECTED.  The SARS-CoV-2 RNA is generally detectable in upper respiratory specimens during the acute phase of infection. The lowest concentration of SARS-CoV-2 viral copies this assay can detect is 138 copies/mL. A negative result does not preclude SARS-Cov-2 infection and should not be used as the sole basis for treatment or other patient management decisions. A negative result  may occur with  improper specimen collection/handling, submission of specimen other than nasopharyngeal swab, presence of viral mutation(s) within the areas targeted by this assay, and inadequate number of viral copies(<138 copies/mL). A negative result must be combined with clinical observations, patient history, and epidemiological information. The expected result is Negative.  Fact Sheet for Patients:  BloggerCourse.com  Fact Sheet for Healthcare Providers:  SeriousBroker.it  This test is no t yet approved or cleared by the [place] FDA and  has been authorized for detection and/or diagnosis of SARS-CoV-2 by FDA under an Emergency Use Authorization (EUA). This EUA will remain  in effect (meaning this test can be used) for the duration of the COVID-19 declaration under Section 564(b)(1) of the Act, 21 U.S.C.section 360bbb-3(b)(1), unless the authorization is terminated  or revoked sooner.       Influenza A by PCR NEGATIVE NEGATIVE Final   Influenza B by PCR NEGATIVE NEGATIVE Final    Comment: (NOTE) The Xpert Xpress SARS-CoV-2/FLU/RSV plus assay is intended as an aid in the diagnosis of influenza from Nasopharyngeal swab specimens and should not be used as a sole basis for treatment. Nasal washings and aspirates are unacceptable for Xpert Xpress SARS-CoV-2/FLU/RSV testing.  Fact Sheet for Patients: BloggerCourse.com  Fact Sheet for Healthcare Providers: SeriousBroker.it  This test is not yet approved or cleared by the [place] FDA and has been authorized for detection and/or diagnosis of SARS-CoV-2 by FDA under an Emergency Use Authorization (EUA). This EUA will remain in effect (meaning this test can be used) for the duration of the COVID-19 declaration under Section 564(b)(1) of the Act, 21 U.S.C. section 360bbb-3(b)(1), unless the authorization is terminated  or revoked.  Performed at [facility], [address] [place], [place] [address]      Time coordinating discharge: Over 30 minutes  SIGNED:   Tresa Moore, MD  [facility] 09/19/2020, 5:03 PM Pager   If 7PM-7AM, please contact night-coverage

## 2020-09-20 LAB — GLUCOSE, CAPILLARY
Glucose-Capillary: 188 mg/dL — ABNORMAL HIGH (ref 70–99)
Glucose-Capillary: 100 mg/dL — ABNORMAL HIGH (ref 70–99)
Glucose-Capillary: 102 mg/dL — ABNORMAL HIGH (ref 70–99)
Glucose-Capillary: 115 mg/dL — ABNORMAL HIGH (ref 70–99)
Glucose-Capillary: 124 mg/dL — ABNORMAL HIGH (ref 70–99)

## 2020-09-20 MED ORDER — INSULIN ASPART 100 UNIT/ML IJ SOLN
0.0000 [IU] | Freq: Three times a day (TID) | INTRAMUSCULAR | Status: DC
  Administered 2020-09-21: 3 [IU] via SUBCUTANEOUS
  Administered 2020-09-22: 5 [IU] via SUBCUTANEOUS
  Administered 2020-09-22: 8 [IU] via SUBCUTANEOUS
  Administered 2020-09-22: 2 [IU] via SUBCUTANEOUS
  Administered 2020-09-23: 3 [IU] via SUBCUTANEOUS
  Filled 2020-09-20 (×6): qty 1

## 2020-09-20 MED ORDER — INSULIN ASPART 100 UNIT/ML IJ SOLN
0.0000 [IU] | Freq: Every day | INTRAMUSCULAR | Status: DC
  Administered 2020-09-21: 2 [IU] via SUBCUTANEOUS
  Administered 2020-09-22: 3 [IU] via SUBCUTANEOUS
  Filled 2020-09-20 (×2): qty 1

## 2020-09-20 MED ORDER — HYDROMORPHONE HCL 1 MG/ML IJ SOLN
1.0000 mg | INTRAMUSCULAR | Status: DC | PRN
  Administered 2020-09-20 – 2020-09-21 (×7): 1 mg via INTRAVENOUS
  Filled 2020-09-20 (×8): qty 1

## 2020-09-20 NOTE — TOC Progression Note (Signed)
Transition of Care [facility]) - Progression Note    Patient Details  Name: Sabrina Hanna MRN: [id number] Date of Birth: 04/19/1975  [facility] CM/SW Contact  Caryn Section, RN Phone Number: 09/20/2020, 10:47 AM  Clinical Narrative:   Late entry from 7/27.  Patient takes care of her husband at home, as he is disabled.  She does not currently have home health and drives herself and her husband to appointments.  She is able to get medications.    Patient is expected to transfer to [facility] for continued care.  At this time, [facility] has no beds and will notify staff when a bed becomes available.    TOC contact information given, TOC to follow to discharge.         Expected Discharge Plan and Services           Expected Discharge Date: 09/19/20                                     Social Determinants of Health (SDOH) Interventions    Readmission Risk Interventions No flowsheet data found.

## 2020-09-20 NOTE — Progress Notes (Signed)
PROGRESS NOTE    Sabrina Hanna  IEP:[id number] DOB: 1975/06/11 DOA: 09/18/2020 PCP: Leotis Shames, MD   Brief Narrative:  45 year old female recent admission to [facility] for severe pancreatitis secondary to elevated triglycerides.  Status post plasmapheresis at [facility].  Presents to [facility] 1 week after discharge from [facility] with complaints of persistent abdominal and chest pain.  Imaging demonstrates pancreatic pseudocyst formation new from prior.  Patient was admitted for pain control.  GI consulted.  Per GI no intervention necessary for pancreatic pseudocyst.  If cyst wall is not mature than drain placement is not an option.  Patient is nonseptic and nontoxic appearing.  Apparently she was accepted by [facility] for transfer however they have no available bed so she was admitted to [facility] under hospitalist service.  She was seen in consultation by [facility] gastroenterology.  No intervention for pseudocyst is recommended.  From their standpoint no reason why patient can advance diet.  Patient continues to endorse abdominal pain and poor p.o. intake.  She is able to tolerate liquids however.    Assessment & Plan:   Principal Problem:   Abdominal pain Active Problems:   HLD (hyperlipidemia)   GERD (gastroesophageal reflux disease)   Uncontrolled diabetes mellitus (HCC)   Acute pancreatitis without necrosis or infection, unspecified  Acute abdominal pain Pancreatic pseudocyst No radiographic evidence of acute pancreatitis Lipase and triglyceride level reassuring GI consulted No intervention suggested for pseudocyst Possible contribution from mild constipation secondary to opioids Plan: No invasive intervention for pseudocyst Multimodal pain control, minimize narcotics Bowel regimen Advance as tolerated Patient can transfer to MedSurg bed at [facility] when transport arrives  GERD PPI  Type 2 diabetes mellitus with hyperglycemia Sliding scale Repeat hemoglobin A1c Hold home  metformin Consider addition of long-acting insulin once patient able to tolerate more p.o.  Hyperlipidemia Statin currently on hold  Acute on chronic anemia Unclear source No evidence of blood loss Normal coagulation studies Possible iron deficiency versus B12 deficiency Plan: Anemia work-up  Chest pain Do not suspect ACS VQ scan and echocardiogram normal       DVT prophylaxis: SCD Code Status: Full Family Communication: None today Disposition Plan: Status is: Inpatient  Remains inpatient appropriate because:Inpatient level of care appropriate due to severity of illness  Dispo: The patient is from: Home              Anticipated d/c is to: Home              Patient currently is not medically stable to d/c.   Difficult to place patient No  Patient with acute on chronic abdominal pain associated with pancreatic pseudocyst.  No radiographic evidence of acute pancreatitis.  Patient was apparently accepted by [facility] and placed on the wait list for transfer.  Will attempt to medically optimize and if patient continues to need transfer we reach out to the transfer center     Level of care: Med-Surg  Consultants:  GI, signed off  Procedures:  None  Antimicrobials:  None   Subjective: Seen and examined.  Interview conducted in [place].  Patient in mild distress due to pain.  Endorses pain all over her belly.  Unable to localize.  Objective: Vitals:   09/20/20 0116 09/20/20 0442 09/20/20 0820 09/20/20 1127  BP: 100/70 92/76 100/66 101/73  Pulse: (!) 59 71 61 77  Resp:  18 18 18   Temp:  98.1 F (36.7 C) 98.4 F (36.9 C) 98.5 F (36.9 C)  TempSrc:      SpO2:  98% 98% 99%  Weight:      Height:        Intake/Output Summary (Last 24 hours) at 09/20/2020 1255 Last data filed at 09/20/2020 1028 Gross per 24 hour  Intake 1860 ml  Output --  Net 1860 ml   Filed Weights   09/18/20 1444  Weight: 59 kg    Examination:  General exam: Sitting up in bed.   Endorsing mild pain.  No visible distress Respiratory system: Clear to auscultation. Respiratory effort normal. Cardiovascular system: S1-S2, regular rate and rhythm, no murmurs, no pedal edema  gastrointestinal system: Soft, nondistended, mild TTP, normal bowel sounds Central nervous system: Alert and oriented. No focal neurological deficits. Extremities: Symmetric 5 x 5 power. Skin: No rashes, lesions or ulcers Psychiatry: Judgement and insight appear normal. Mood & affect appropriate.     Data Reviewed: I have personally reviewed following labs and imaging studies  CBC: Recent Labs  Lab 09/18/20 1446 09/19/20 0419  WBC 6.0 5.4  HGB 8.8* 8.4*  HCT 28.5* 26.2*  MCV 91.3 91.3  PLT 588* 480*   Basic Metabolic Panel: Recent Labs  Lab 09/18/20 1446 09/19/20 0419  NA 138 139  K 3.6 3.3*  CL 105 110  CO2 23 23  GLUCOSE 132* 72  BUN 10 7  CREATININE 0.45 0.41*  CALCIUM 9.0 8.3*   GFR: Estimated Creatinine Clearance: 72.9 mL/min (A) (by C-G formula based on SCr of 0.41 mg/dL (L)). Liver Function Tests: Recent Labs  Lab 09/18/20 1446 09/19/20 0419  AST 29 23  ALT 16 14  ALKPHOS 41 37*  BILITOT 0.6 0.4  PROT 8.1 6.8  ALBUMIN 3.7 3.1*   Recent Labs  Lab 09/18/20 1446  LIPASE 40   No results for input(s): AMMONIA in the last 168 hours. Coagulation Profile: No results for input(s): INR, PROTIME in the last 168 hours. Cardiac Enzymes: No results for input(s): CKTOTAL, CKMB, CKMBINDEX, TROPONINI in the last 168 hours. BNP (last 3 results) No results for input(s): PROBNP in the last 8760 hours. HbA1C: Recent Labs    09/18/20 1641  HGBA1C 9.2*   CBG: Recent Labs  Lab 09/19/20 1128 09/19/20 1614 09/20/20 0100 09/20/20 0731 09/20/20 1236  GLUCAP 85 100* 188* 100* 102*   Lipid Profile: Recent Labs    09/18/20 1645  TRIG 93   Thyroid Function Tests: No results for input(s): TSH, T4TOTAL, FREET4, T3FREE, THYROIDAB in the last 72 hours. Anemia  Panel: Recent Labs    09/19/20 0419  VITAMINB12 713  FOLATE 12.5  FERRITIN 192  TIBC 322  IRON 42   Sepsis Labs: No results for input(s): PROCALCITON, LATICACIDVEN in the last 168 hours.  Recent Results (from the past 240 hour(s))  Resp Panel by RT-PCR (Flu A&B, Covid) Nasopharyngeal Swab     Status: None   Collection Time: 09/18/20 10:02 PM   Specimen: Nasopharyngeal Swab; Nasopharyngeal(NP) swabs in vial transport medium  Result Value Ref Range Status   SARS Coronavirus 2 by RT PCR NEGATIVE NEGATIVE Final    Comment: (NOTE) SARS-CoV-2 target nucleic acids are NOT DETECTED.  The SARS-CoV-2 RNA is generally detectable in upper respiratory specimens during the acute phase of infection. The lowest concentration of SARS-CoV-2 viral copies this assay can detect is 138 copies/mL. A negative result does not preclude SARS-Cov-2 infection and should not be used as the sole basis for treatment or other patient management decisions. A negative result may occur with  improper specimen collection/handling, submission of specimen other than nasopharyngeal  swab, presence of viral mutation(s) within the areas targeted by this assay, and inadequate number of viral copies(<138 copies/mL). A negative result must be combined with clinical observations, patient history, and epidemiological information. The expected result is Negative.  Fact Sheet for Patients:  BloggerCourse.com  Fact Sheet for Healthcare Providers:  SeriousBroker.it  This test is no t yet approved or cleared by the [place] FDA and  has been authorized for detection and/or diagnosis of SARS-CoV-2 by FDA under an Emergency Use Authorization (EUA). This EUA will remain  in effect (meaning this test can be used) for the duration of the COVID-19 declaration under Section 564(b)(1) of the Act, 21 U.S.C.section 360bbb-3(b)(1), unless the authorization is terminated  or  revoked sooner.       Influenza A by PCR NEGATIVE NEGATIVE Final   Influenza B by PCR NEGATIVE NEGATIVE Final    Comment: (NOTE) The Xpert Xpress SARS-CoV-2/FLU/RSV plus assay is intended as an aid in the diagnosis of influenza from Nasopharyngeal swab specimens and should not be used as a sole basis for treatment. Nasal washings and aspirates are unacceptable for Xpert Xpress SARS-CoV-2/FLU/RSV testing.  Fact Sheet for Patients: BloggerCourse.com  Fact Sheet for Healthcare Providers: SeriousBroker.it  This test is not yet approved or cleared by the [place] FDA and has been authorized for detection and/or diagnosis of SARS-CoV-2 by FDA under an Emergency Use Authorization (EUA). This EUA will remain in effect (meaning this test can be used) for the duration of the COVID-19 declaration under Section 564(b)(1) of the Act, 21 U.S.C. section 360bbb-3(b)(1), unless the authorization is terminated or revoked.  Performed at [facility], [address], [place], [place] [address]          Radiology Studies: DG Chest 2 View  Result Date: 09/18/2020 CLINICAL DATA:  Abdominal and chest pain for 4 weeks. EXAM: CHEST - 2 VIEW COMPARISON:  PA and lateral chest 02/22/2020. FINDINGS: Small left pleural effusion and basilar airspace disease are seen. No right effusion. The right lung is clear. Heart size is normal. No pneumothorax. No acute or focal bony abnormality. IMPRESSION: Small left pleural effusion and basilar airspace disease which is likely atelectasis. Electronically Signed   By: Drusilla Kanner M.D.   On: 09/18/2020 18:57   NM Pulmonary Perfusion  Result Date: 09/19/2020 CLINICAL DATA:  Shortness of breath and chest pain. EXAM: NUCLEAR MEDICINE PERFUSION LUNG SCAN TECHNIQUE: Perfusion images were obtained in multiple projections after intravenous injection of radiopharmaceutical. Ventilation scans intentionally  deferred if perfusion scan and chest x-ray adequate for interpretation during COVID 19 epidemic. RADIOPHARMACEUTICALS:  4.46 mCi Tc-69m MAA IV COMPARISON:  Chest radiograph from 09/19/2020. FINDINGS: No peripheral segmental wedge-shaped perfusion defects identified to suggest acute pulmonary embolus. Asymmetric, nonsegmental decreased tracer activity localizing to the left lower lobe corresponds to left pleural effusion identified on chest radiograph from today. IMPRESSION: 1. No evidence for acute pulmonary embolus. Electronically Signed   By: Signa Kell M.D.   On: 09/19/2020 15:36   CT ABDOMEN PELVIS W CONTRAST  Result Date: 09/18/2020 CLINICAL DATA:  Abdominal and chest pain for approximately 4 weeks. The patient was diagnosed with pancreatitis and admitted to another hospital 3 weeks ago. EXAM: CT ABDOMEN AND PELVIS WITH CONTRAST TECHNIQUE: Multidetector CT imaging of the abdomen and pelvis was performed using the standard protocol following bolus administration of intravenous contrast. CONTRAST:  75 mL OMNIPAQUE IOHEXOL 300 MG/ML  SOLN COMPARISON:  CT abdomen and pelvis 08/27/2020. FINDINGS: Lower chest: The patient has  a new small left pleural effusion and associated compressive left basilar atelectasis. No right effusion or pericardial effusion. Imaged right lung is clear. Hepatobiliary: No focal liver abnormality is seen. No gallstones, gallbladder wall thickening, or biliary dilatation. Pancreas: A new fluid collection which surrounds the body and extends lateral to the tail of the pancreas measures approximately 12 cm craniocaudal by 5 cm AP by 13 cm transverse and is compatible with a pseudocyst. A second fluid collection lateral to the head of the pancreas is extends a total of 8.2 cm craniocaudal and measures up to 2.9 by 1.8 cm in the axial plane. The pancreas enhances homogeneously. Spleen: Normal in size without focal abnormality. Adrenals/Urinary Tract: Adrenal glands are unremarkable.  Kidneys are normal, without renal calculi, focal lesion, or hydronephrosis. Bladder is unremarkable. Stomach/Bowel: Stomach is within normal limits. No evidence of appendicitis. No evidence of bowel wall thickening, distention, or inflammatory changes. Vascular/Lymphatic: No significant vascular findings are present. No enlarged abdominal or pelvic lymph nodes. Reproductive: Status post hysterectomy. No adnexal masses. Other: None. Musculoskeletal: Negative. IMPRESSION: Two new fluid collections about the pancreas described above are consistent with pseudocysts. The pancreas appears normal. Negative for pancreatic necrosis. Small left pleural effusion and associated compressive atelectasis are new since the prior CT. Electronically Signed   By: Drusilla Kanner M.D.   On: 09/18/2020 17:08   DG Chest Port 1 View  Result Date: 09/19/2020 CLINICAL DATA:  Chest pain.  Shortness of breath. EXAM: PORTABLE CHEST 1 VIEW COMPARISON:  09/18/2020. FINDINGS: Mediastinum is normal. Mild cardiomegaly. Interim progression of left mid and left base lung infiltrates. Stable small left pleural effusion. No pneumothorax. IMPRESSION: 1. Interim progression of left mid and left lung base infiltrates. Stable small left pleural effusion. 2.  Mild cardiomegaly. Electronically Signed   By: Maisie Fus  Register   On: 09/19/2020 11:24   ECHOCARDIOGRAM COMPLETE  Result Date: 09/19/2020    ECHOCARDIOGRAM REPORT   Patient Name:   LENORA GOMES Date of Exam: 09/19/2020 Medical Rec #:  [id number]            Height:       60.0 in Accession #:    [id number]           Weight:       130.0 lb Date of Birth:  09/14/1975            BSA:          1.554 m Patient Age:    45 years             BP:           100/64 mmHg Patient Gender: F                    HR:           60 bpm. Exam Location:  [facility] Procedure: 2D Echo, Cardiac Doppler, Color Doppler and Strain Analysis Indications:     Chest pain R07.9  History:         Patient has no prior history of  Echocardiogram examinations.                  Risk Factors:Diabetes.  Sonographer:     Cristela Blue RDCS (AE) Referring Phys:  [id number] Gertha Calkin Diagnosing Phys: Yvonne Kendall MD  Sonographer Comments: Global longitudinal strain was attempted. IMPRESSIONS  1. Left ventricular ejection fraction, by estimation, is 60 to 65%. The left ventricle has normal function. The left ventricle has  no regional wall motion abnormalities. Left ventricular diastolic parameters were normal. The average left ventricular global longitudinal strain is -24.5 %. The global longitudinal strain is normal.  2. Right ventricular systolic function is normal. The right ventricular size is normal. There is normal pulmonary artery systolic pressure.  3. Left atrial size was mildly dilated.  4. The mitral valve is normal in structure. No evidence of mitral valve regurgitation. No evidence of mitral stenosis.  5. The aortic valve has an indeterminant number of cusps. Aortic valve regurgitation is not visualized. No aortic stenosis is present.  6. The inferior vena cava is normal in size with greater than 50% respiratory variability, suggesting right atrial pressure of 3 mmHg. FINDINGS  Left Ventricle: Left ventricular ejection fraction, by estimation, is 60 to 65%. The left ventricle has normal function. The left ventricle has no regional wall motion abnormalities. The average left ventricular global longitudinal strain is -24.5 %. The global longitudinal strain is normal. The left ventricular internal cavity size was normal in size. There is no left ventricular hypertrophy. Left ventricular diastolic parameters were normal. Right Ventricle: The right ventricular size is normal. No increase in right ventricular wall thickness. Right ventricular systolic function is normal. There is normal pulmonary artery systolic pressure. The tricuspid regurgitant velocity is 1.78 m/s, and  with an assumed right atrial pressure of 3 mmHg, the estimated right  ventricular systolic pressure is 15.7 mmHg. Left Atrium: Left atrial size was mildly dilated. Right Atrium: Right atrial size was normal in size. Pericardium: There is no evidence of pericardial effusion. Mitral Valve: The mitral valve is normal in structure. No evidence of mitral valve regurgitation. No evidence of mitral valve stenosis. Tricuspid Valve: The tricuspid valve is normal in structure. Tricuspid valve regurgitation is trivial. Aortic Valve: The aortic valve has an indeterminant number of cusps. Aortic valve regurgitation is not visualized. No aortic stenosis is present. Aortic valve mean gradient measures 5.0 mmHg. Aortic valve peak gradient measures 8.2 mmHg. Aortic valve area, by VTI measures 2.18 cm. Pulmonic Valve: The pulmonic valve was grossly normal. Pulmonic valve regurgitation is not visualized. No evidence of pulmonic stenosis. Aorta: The aortic root is normal in size and structure. Pulmonary Artery: The pulmonary artery is of normal size. Venous: The inferior vena cava is normal in size with greater than 50% respiratory variability, suggesting right atrial pressure of 3 mmHg. IAS/Shunts: No atrial level shunt detected by color flow Doppler.  LEFT VENTRICLE PLAX 2D LVIDd:         3.80 cm  Diastology LVIDs:         2.40 cm  LV e' medial:    8.16 cm/s LV PW:         1.00 cm  LV E/e' medial:  13.7 LV IVS:        0.79 cm  LV e' lateral:   14.70 cm/s LVOT diam:     2.00 cm  LV E/e' lateral: 7.6 LV SV:         66 LV SV Index:   42       2D Longitudinal Strain LVOT Area:     3.14 cm 2D Strain GLS Avg:     -24.5 %                          3D Volume EF:  3D EF:        69 %                         LV EDV:       129 ml                         LV ESV:       39 ml                         LV SV:        89 ml RIGHT VENTRICLE RV Basal diam:  3.60 cm RV S prime:     12.00 cm/s LEFT ATRIUM           Index       RIGHT ATRIUM           Index LA diam:      3.40 cm 2.19 cm/m  RA Area:      14.70 cm LA Vol (A4C): 54.3 ml 34.93 ml/m RA Volume:   37.00 ml  23.80 ml/m  AORTIC VALVE                    PULMONIC VALVE AV Area (Vmax):    2.18 cm     PV Vmax:        0.76 m/s AV Area (Vmean):   2.07 cm     PV Peak grad:   2.3 mmHg AV Area (VTI):     2.18 cm     RVOT Peak grad: 4 mmHg AV Vmax:           143.00 cm/s AV Vmean:          103.000 cm/s AV VTI:            0.301 m AV Peak Grad:      8.2 mmHg AV Mean Grad:      5.0 mmHg LVOT Vmax:         99.20 cm/s LVOT Vmean:        67.800 cm/s LVOT VTI:          0.209 m LVOT/AV VTI ratio: 0.69  AORTA Ao Root diam: 2.70 cm MITRAL VALVE                TRICUSPID VALVE MV Area (PHT): 4.15 cm     TR Peak grad:   12.7 mmHg MV Decel Time: 183 msec     TR Vmax:        178.00 cm/s MV E velocity: 112.00 cm/s MV A velocity: 65.60 cm/s   SHUNTS MV E/A ratio:  1.71         Systemic VTI:  0.21 m                             Systemic Diam: 2.00 cm Yvonne Kendallhristopher End MD Electronically signed by Yvonne Kendallhristopher End MD Signature Date/Time: 09/19/2020/5:04:20 PM    Final    US ABDOMEN LIMITED RUQ (LIVER/GB)  Result Date: 09/19/2020 CLINICAL DATA:  Initial evaluation for acute pancreatitis. EXAM: ULTRASOUND ABDOMEN LIMITED RIGHT UPPER QUADRANT COMPARISON:  CT from earlier the same day. FINDINGS: Gallbladder: Small amount of echogenic sludge seen layering within the gallbladder lumen. No frank cholelithiasis. Small 3 mm polyp seen adherent to the gallbladder wall. Gallbladder wall measures within normal limits at 1.4 mm. No sonographic Eulah PontMurphy  sign elicited on exam. Common bile duct: Diameter: 4.3 mm Liver: No focal lesion identified. Diffusely increased echogenicity within the hepatic parenchyma. Portal vein is patent on color Doppler imaging with normal direction of blood flow towards the liver. Other: Scattered small volume free fluid present within the right upper quadrant. IMPRESSION: 1. Gallbladder sludge without cholelithiasis or evidence for acute cholecystitis. No biliary  dilatation. 2. Diffusely increased echogenicity within the hepatic parenchyma, suggesting steatosis. 3. Small volume free fluid within the right upper quadrant. 4. Incidental 3 mm gallbladder polyp, almost certainly benign given size. No follow-up imaging recommended. Electronically Signed   By: Rise Mu M.D.   On: 09/19/2020 01:20        Scheduled Meds:  gabapentin  300 mg Oral TID   insulin aspart  0-15 Units Subcutaneous TID WC   insulin aspart  0-5 Units Subcutaneous QHS   pantoprazole  40 mg Oral Daily   senna-docusate  1 tablet Oral BID   Continuous Infusions:  sodium chloride 150 mL/hr at 09/19/20 2202     LOS: 2 days    Time spent: 25 minutes    Tresa Moore, MD [facility] Pager [phone]  If 7PM-7AM, please contact night-coverage 09/20/2020, 12:55 PM

## 2020-09-20 NOTE — Plan of Care (Signed)
Pt continues to have 10/10 pain. She states relief lasts for about 45 mins and then returns. She has not noticed any improvement so far in pain relief. Problem: Education: Goal: Knowledge of General Education information will improve Description: Including pain rating scale, medication(s)/side effects and non-pharmacologic comfort measures Outcome: Progressing   Problem: Health Behavior/Discharge Planning: Goal: Ability to manage health-related needs will improve Outcome: Progressing   Problem: Clinical Measurements: Goal: Ability to maintain clinical measurements within normal limits will improve Outcome: Progressing Goal: Will remain free from infection Outcome: Progressing Goal: Diagnostic test results will improve Outcome: Progressing Goal: Respiratory complications will improve Outcome: Progressing Goal: Cardiovascular complication will be avoided Outcome: Progressing   Problem: Activity: Goal: Risk for activity intolerance will decrease Outcome: Progressing   Problem: Nutrition: Goal: Adequate nutrition will be maintained Outcome: Progressing   Problem: Coping: Goal: Level of anxiety will decrease Outcome: Progressing   Problem: Elimination: Goal: Will not experience complications related to bowel motility Outcome: Progressing Goal: Will not experience complications related to urinary retention Outcome: Progressing   Problem: Pain Managment: Goal: General experience of comfort will improve Outcome: Progressing   Problem: Safety: Goal: Ability to remain free from injury will improve Outcome: Progressing   Problem: Skin Integrity: Goal: Risk for impaired skin integrity will decrease Outcome: Progressing

## 2020-09-21 LAB — CBC WITH DIFFERENTIAL/PLATELET
Abs Immature Granulocytes: 0.01 10*3/uL (ref 0.00–0.07)
Basophils Absolute: 0 10*3/uL (ref 0.0–0.1)
Basophils Relative: 0 %
Eosinophils Absolute: 0.2 10*3/uL (ref 0.0–0.5)
Eosinophils Relative: 5 %
HCT: 27.2 % — ABNORMAL LOW (ref 36.0–46.0)
Hemoglobin: 8.6 g/dL — ABNORMAL LOW (ref 12.0–15.0)
Immature Granulocytes: 0 %
Lymphocytes Relative: 41 %
Lymphs Abs: 2.1 10*3/uL (ref 0.7–4.0)
MCH: 28.8 pg (ref 26.0–34.0)
MCHC: 31.6 g/dL (ref 30.0–36.0)
MCV: 91 fL (ref 80.0–100.0)
Monocytes Absolute: 0.4 10*3/uL (ref 0.1–1.0)
Monocytes Relative: 7 %
Neutro Abs: 2.5 10*3/uL (ref 1.7–7.7)
Neutrophils Relative %: 47 %
Platelets: 392 10*3/uL (ref 150–400)
RBC: 2.99 MIL/uL — ABNORMAL LOW (ref 3.87–5.11)
RDW: 14.4 % (ref 11.5–15.5)
WBC: 5.3 10*3/uL (ref 4.0–10.5)
nRBC: 0 % (ref 0.0–0.2)

## 2020-09-21 LAB — BASIC METABOLIC PANEL
Anion gap: 4 — ABNORMAL LOW (ref 5–15)
BUN: <5 mg/dL — ABNORMAL LOW (ref 6–20)
CO2: 27 mmol/L (ref 22–32)
Calcium: 8.4 mg/dL — ABNORMAL LOW (ref 8.9–10.3)
Chloride: 109 mmol/L (ref 98–111)
Creatinine, Ser: <0.3 mg/dL — ABNORMAL LOW (ref 0.44–1.00)
Glucose, Bld: 101 mg/dL — ABNORMAL HIGH (ref 70–99)
Potassium: 3.4 mmol/L — ABNORMAL LOW (ref 3.5–5.1)
Sodium: 140 mmol/L (ref 135–145)

## 2020-09-21 LAB — GLUCOSE, CAPILLARY
Glucose-Capillary: 105 mg/dL — ABNORMAL HIGH (ref 70–99)
Glucose-Capillary: 148 mg/dL — ABNORMAL HIGH (ref 70–99)
Glucose-Capillary: 179 mg/dL — ABNORMAL HIGH (ref 70–99)
Glucose-Capillary: 234 mg/dL — ABNORMAL HIGH (ref 70–99)

## 2020-09-21 LAB — LIPASE, BLOOD: Lipase: 33 U/L (ref 11–51)

## 2020-09-21 MED ORDER — POTASSIUM CHLORIDE CRYS ER 20 MEQ PO TBCR
40.0000 meq | EXTENDED_RELEASE_TABLET | Freq: Once | ORAL | Status: AC
  Administered 2020-09-21: 40 meq via ORAL
  Filled 2020-09-21: qty 2

## 2020-09-21 MED ORDER — HYDROMORPHONE HCL 1 MG/ML IJ SOLN
0.5000 mg | INTRAMUSCULAR | Status: DC | PRN
  Administered 2020-09-21: 0.5 mg via INTRAVENOUS
  Filled 2020-09-21: qty 1

## 2020-09-21 MED ORDER — POLYETHYLENE GLYCOL 3350 17 G PO PACK
17.0000 g | PACK | Freq: Every day | ORAL | Status: DC
  Administered 2020-09-21 – 2020-09-23 (×3): 17 g via ORAL
  Filled 2020-09-21 (×3): qty 1

## 2020-09-21 MED ORDER — DICYCLOMINE HCL 20 MG PO TABS
20.0000 mg | ORAL_TABLET | Freq: Three times a day (TID) | ORAL | Status: DC
  Administered 2020-09-21 – 2020-09-23 (×9): 20 mg via ORAL
  Filled 2020-09-21 (×11): qty 1

## 2020-09-21 MED ORDER — SIMETHICONE 80 MG PO CHEW
80.0000 mg | CHEWABLE_TABLET | Freq: Four times a day (QID) | ORAL | Status: DC
  Administered 2020-09-21 – 2020-09-23 (×10): 80 mg via ORAL
  Filled 2020-09-21 (×12): qty 1

## 2020-09-21 MED ORDER — GABAPENTIN 400 MG PO CAPS
400.0000 mg | ORAL_CAPSULE | Freq: Three times a day (TID) | ORAL | Status: DC
  Administered 2020-09-21 – 2020-09-23 (×7): 400 mg via ORAL
  Filled 2020-09-21 (×7): qty 1

## 2020-09-21 NOTE — Progress Notes (Signed)
PROGRESS NOTE    Sabrina SpruceJosefina Hanna  WUJ:[id number]:[id number] DOB: [birth year] DOA: 09/18/2020 PCP: Leotis ShamesSingh, Jasmine, MD   Brief Narrative:  45 year old female recent [facility] for severe pancreatitis secondary to elevated triglycerides.  Status [facility].  [facility] with complaints of persistent abdominal and chest pain.  Imaging demonstrates pancreatic pseudocyst formation new from prior.  Patient was admitted for pain control.  GI consulted.  Per GI no intervention necessary for pancreatic pseudocyst.  If cyst wall is not mature than drain placement is not an option.  Patient is nonseptic and nontoxic appearing.  [facility] however they have no available bed so [facility] was [facility] under hospitalist service.  She was seen in consultation by [facility].  No intervention for pseudocyst is recommended.  From their standpoint no reason why patient can advance diet.  Patient continues to endorse abdominal pain and poor p.o. intake.  She is able to tolerate liquids however.  Patient continues to endorse severe 10 out of 10 abdominal pain however is often seen speaking on the phone with family or sleeping.  I explained to her the hazards of IV narcotic use and that we need to wean off of them.  I explained that her pancreas does not demonstrate signs of acute pancreatitis and the pseudocyst will resolve on their own over time.  No intervention is required.  Labs, vitals, lipase have been reassuring    Assessment & Plan:   Principal Problem:   Abdominal pain Active Problems:   HLD (hyperlipidemia)   GERD (gastroesophageal reflux disease)   Uncontrolled diabetes mellitus (HCC)   Acute pancreatitis without necrosis or infection, unspecified  Acute abdominal pain Pancreatic pseudocyst No radiographic evidence of acute pancreatitis Lipase and triglyceride level reassuring GI  consulted No intervention suggested for pseudocyst Possible contribution from mild constipation secondary to opioids Plan: No invasive intervention for pseudocyst Multimodal pain control, discontinue/de-escalate IV narcotics Diet as tolerated, advance to soft No specific indication for hospital hospital transfer.  If [facility] wants to except for continuity of care patient can transfer otherwise we will attempt to medically optimize and discharge home.  GERD PPI  Type 2 diabetes mellitus with hyperglycemia Sliding scale Repeat hemoglobin A1c Hold home metformin Consider addition of long-acting insulin once patient able to tolerate more p.o.  Hyperlipidemia Statin currently on hold  Acute on chronic anemia Unclear source No evidence of blood loss Normal coagulation studies Possible iron deficiency versus B12 deficiency Plan: Anemia work-up  Chest pain Do not suspect ACS VQ scan and echocardiogram normal       DVT prophylaxis: SCD Code Status: Full Family Communication: None today Disposition Plan: Status is: Inpatient  Remains inpatient appropriate because:Inpatient level of care appropriate due to severity of illness  Dispo: The patient is from: Home              Anticipated d/c is to: Home              Patient currently is not medically stable to d/c.   Difficult to place patient No  Patient with acute on chronic abdominal pain associated with pancreatic pseudocyst.  No radiographic evidence of acute pancreatitis.  [facility] but has been sober 3days.  Vital signs and laboratory data is reassuring.  Patient continues to endorse abdominal pain though complaints are out of proportion to exam.  Will attempt to medically  optimize and either transfer or discharge     Level of care: Med-Surg  Consultants:  GI, signed off  Procedures:  None  Antimicrobials:  None   Subjective: Patient seen and examined.  In minimal  distress due to pain.  Objective: Vitals:   09/21/20 0043 09/21/20 0436 09/21/20 0807 09/21/20 1157  BP: 106/69 102/70 118/78 106/75  Pulse: 70 66 68 61  Resp: 18 16 16 16   Temp: 98 F (36.7 C) 98.4 F (36.9 C) 98.2 F (36.8 C) 98.5 F (36.9 C)  TempSrc:      SpO2: 96% 96% 97% 98%  Weight:      Height:        Intake/Output Summary (Last 24 hours) at 09/21/2020 1247 Last data filed at 09/21/2020 1008 Gross per 24 hour  Intake 2340 ml  Output --  Net 2340 ml   Filed Weights   09/18/20 1444  Weight: 59 kg    Examination:  General exam: Sitting up in bed.  Endorsing mild pain.  No visible distress. Respiratory system: Clear to auscultation. Respiratory effort normal. Cardiovascular system: S1-S2, regular rate and rhythm, no murmurs, no pedal edema  gastrointestinal system: Soft, nondistended, mild TTP, normal bowel sounds Central nervous system: Alert and oriented. No focal neurological deficits. Extremities: Symmetric 5 x 5 power. Skin: No rashes, lesions or ulcers Psychiatry: Judgement and insight appear normal. Mood & affect appropriate.     Data Reviewed: I have personally reviewed following labs and imaging studies  CBC: Recent Labs  Lab 09/18/20 1446 09/19/20 0419 09/21/20 0754  WBC 6.0 5.4 5.3  NEUTROABS  --   --  2.5  HGB 8.8* 8.4* 8.6*  HCT 28.5* 26.2* 27.2*  MCV 91.3 91.3 91.0  PLT 588* 480* 392   Basic Metabolic Panel: Recent Labs  Lab 09/18/20 1446 09/19/20 0419 09/21/20 0754  NA 138 139 140  K 3.6 3.3* 3.4*  CL 105 110 109  CO2 23 23 27   GLUCOSE 132* 72 101*  BUN 10 7 <5*  CREATININE 0.45 0.41* <0.30*  CALCIUM 9.0 8.3* 8.4*   GFR: CrCl cannot be calculated (This lab value cannot be used to calculate CrCl because it is not a number: <0.30). Liver Function Tests: Recent Labs  Lab 09/18/20 1446 09/19/20 0419  AST 29 23  ALT 16 14  ALKPHOS 41 37*  BILITOT 0.6 0.4  PROT 8.1 6.8  ALBUMIN 3.7 3.1*   Recent Labs  Lab  09/18/20 1446 09/21/20 0754  LIPASE 40 33   No results for input(s): AMMONIA in the last 168 hours. Coagulation Profile: No results for input(s): INR, PROTIME in the last 168 hours. Cardiac Enzymes: No results for input(s): CKTOTAL, CKMB, CKMBINDEX, TROPONINI in the last 168 hours. BNP (last 3 results) No results for input(s): PROBNP in the last 8760 hours. HbA1C: Recent Labs    09/18/20 1641  HGBA1C 9.2*   CBG: Recent Labs  Lab 09/20/20 1236 09/20/20 1615 09/20/20 2124 09/21/20 0812 09/21/20 1244  GLUCAP 102* 115* 124* 105* 148*   Lipid Profile: Recent Labs    09/18/20 1645  TRIG 93   Thyroid Function Tests: No results for input(s): TSH, T4TOTAL, FREET4, T3FREE, THYROIDAB in the last 72 hours. Anemia Panel: Recent Labs    09/19/20 0419  VITAMINB12 713  FOLATE 12.5  FERRITIN 192  TIBC 322  IRON 42   Sepsis Labs: No results for input(s): PROCALCITON, LATICACIDVEN in the last 168 hours.  Recent Results (from the past 240 hour(s))  Resp Panel by RT-PCR (Flu A&B, Covid) Nasopharyngeal Swab     Status: None   Collection Time: 09/18/20 10:02 PM   Specimen: Nasopharyngeal Swab; Nasopharyngeal(NP) swabs in vial transport medium  Result Value Ref Range Status   SARS Coronavirus 2 by RT PCR NEGATIVE NEGATIVE Final    Comment: (NOTE) SARS-CoV-2 target nucleic acids are NOT DETECTED.  The SARS-CoV-2 RNA is generally detectable in upper respiratory specimens during the acute phase of infection. The lowest concentration of SARS-CoV-2 viral copies this assay can detect is 138 copies/mL. A negative result does not preclude SARS-Cov-2 infection and should not be used as the sole basis for treatment or other patient management decisions. A negative result may occur with  improper specimen collection/handling, submission of specimen other than nasopharyngeal swab, presence of viral mutation(s) within the areas targeted by this assay, and inadequate number of  viral copies(<138 copies/mL). A negative result must be combined with clinical observations, patient history, and epidemiological information. The expected result is Negative.  Fact Sheet for Patients:  BloggerCourse.com  Fact Sheet for Healthcare Providers:  SeriousBroker.it  This test is no t yet approved or cleared by the [place] FDA and  has been authorized for detection and/or diagnosis of SARS-CoV-2 by FDA under an Emergency Use Authorization (EUA). This EUA will remain  in effect (meaning this test can be used) for the duration of the COVID-19 declaration under Section 564(b)(1) of the Act, 21 U.S.C.section 360bbb-3(b)(1), unless the authorization is terminated  or revoked sooner.       Influenza A by PCR NEGATIVE NEGATIVE Final   Influenza B by PCR NEGATIVE NEGATIVE Final    Comment: (NOTE) The Xpert Xpress SARS-CoV-2/FLU/RSV plus assay is intended as an aid in the diagnosis of influenza from Nasopharyngeal swab specimens and should not be used as a sole basis for treatment. Nasal washings and aspirates are unacceptable for Xpert Xpress SARS-CoV-2/FLU/RSV testing.  Fact Sheet for Patients: BloggerCourse.com  Fact Sheet for Healthcare Providers: SeriousBroker.it  This test is not yet approved or cleared by the [place] FDA and has been authorized for detection and/or diagnosis of SARS-CoV-2 by FDA under an Emergency Use Authorization (EUA). This EUA will remain in effect (meaning this test can be used) for the duration of the COVID-19 declaration under Section 564(b)(1) of the Act, 21 U.S.C. section 360bbb-3(b)(1), unless the authorization is terminated or revoked.  Performed at [facility], [address] [place], [place] [address]          Radiology Studies: ECHOCARDIOGRAM COMPLETE  Result Date: 09/19/2020    ECHOCARDIOGRAM REPORT    Patient Name:   Sabrina Hanna Date of Exam: 09/19/2020 Medical Rec #:  [id number]            Height:       60.0 in Accession #:    [id number]           Weight:       130.0 lb Date of Birth:  1975-04-12            BSA:          1.554 m Patient Age:    45 years             BP:           100/64 mmHg Patient Gender: F                    HR:  60 bpm. Exam Location:  [facility] Procedure: 2D Echo, Cardiac Doppler, Color Doppler and Strain Analysis Indications:     Chest pain R07.9  History:         Patient has no prior history of Echocardiogram examinations.                  Risk Factors:Diabetes.  Sonographer:     Cristela Blue RDCS (AE) Referring Phys:  [id number] Gertha Calkin Diagnosing Phys: Yvonne Kendall MD  Sonographer Comments: Global longitudinal strain was attempted. IMPRESSIONS  1. Left ventricular ejection fraction, by estimation, is 60 to 65%. The left ventricle has normal function. The left ventricle has no regional wall motion abnormalities. Left ventricular diastolic parameters were normal. The average left ventricular global longitudinal strain is -24.5 %. The global longitudinal strain is normal.  2. Right ventricular systolic function is normal. The right ventricular size is normal. There is normal pulmonary artery systolic pressure.  3. Left atrial size was mildly dilated.  4. The mitral valve is normal in structure. No evidence of mitral valve regurgitation. No evidence of mitral stenosis.  5. The aortic valve has an indeterminant number of cusps. Aortic valve regurgitation is not visualized. No aortic stenosis is present.  6. The inferior vena cava is normal in size with greater than 50% respiratory variability, suggesting right atrial pressure of 3 mmHg. FINDINGS  Left Ventricle: Left ventricular ejection fraction, by estimation, is 60 to 65%. The left ventricle has normal function. The left ventricle has no regional wall motion abnormalities. The average left ventricular global longitudinal  strain is -24.5 %. The global longitudinal strain is normal. The left ventricular internal cavity size was normal in size. There is no left ventricular hypertrophy. Left ventricular diastolic parameters were normal. Right Ventricle: The right ventricular size is normal. No increase in right ventricular wall thickness. Right ventricular systolic function is normal. There is normal pulmonary artery systolic pressure. The tricuspid regurgitant velocity is 1.78 m/s, and  with an assumed right atrial pressure of 3 mmHg, the estimated right ventricular systolic pressure is 15.7 mmHg. Left Atrium: Left atrial size was mildly dilated. Right Atrium: Right atrial size was normal in size. Pericardium: There is no evidence of pericardial effusion. Mitral Valve: The mitral valve is normal in structure. No evidence of mitral valve regurgitation. No evidence of mitral valve stenosis. Tricuspid Valve: The tricuspid valve is normal in structure. Tricuspid valve regurgitation is trivial. Aortic Valve: The aortic valve has an indeterminant number of cusps. Aortic valve regurgitation is not visualized. No aortic stenosis is present. Aortic valve mean gradient measures 5.0 mmHg. Aortic valve peak gradient measures 8.2 mmHg. Aortic valve area, by VTI measures 2.18 cm. Pulmonic Valve: The pulmonic valve was grossly normal. Pulmonic valve regurgitation is not visualized. No evidence of pulmonic stenosis. Aorta: The aortic root is normal in size and structure. Pulmonary Artery: The pulmonary artery is of normal size. Venous: The inferior vena cava is normal in size with greater than 50% respiratory variability, suggesting right atrial pressure of 3 mmHg. IAS/Shunts: No atrial level shunt detected by color flow Doppler.  LEFT VENTRICLE PLAX 2D LVIDd:         3.80 cm  Diastology LVIDs:         2.40 cm  LV e' medial:    8.16 cm/s LV PW:         1.00 cm  LV E/e' medial:  13.7 LV IVS:        0.79 cm  LV e' lateral:   14.70 cm/s LVOT diam:      2.00 cm  LV E/e' lateral: 7.6 LV SV:         66 LV SV Index:   42       2D Longitudinal Strain LVOT Area:     3.14 cm 2D Strain GLS Avg:     -24.5 %                          3D Volume EF:                         3D EF:        69 %                         LV EDV:       129 ml                         LV ESV:       39 ml                         LV SV:        89 ml RIGHT VENTRICLE RV Basal diam:  3.60 cm RV S prime:     12.00 cm/s LEFT ATRIUM           Index       RIGHT ATRIUM           Index LA diam:      3.40 cm 2.19 cm/m  RA Area:     14.70 cm LA Vol (A4C): 54.3 ml 34.93 ml/m RA Volume:   37.00 ml  23.80 ml/m  AORTIC VALVE                    PULMONIC VALVE AV Area (Vmax):    2.18 cm     PV Vmax:        0.76 m/s AV Area (Vmean):   2.07 cm     PV Peak grad:   2.3 mmHg AV Area (VTI):     2.18 cm     RVOT Peak grad: 4 mmHg AV Vmax:           143.00 cm/s AV Vmean:          103.000 cm/s AV VTI:            0.301 m AV Peak Grad:      8.2 mmHg AV Mean Grad:      5.0 mmHg LVOT Vmax:         99.20 cm/s LVOT Vmean:        67.800 cm/s LVOT VTI:          0.209 m LVOT/AV VTI ratio: 0.69  AORTA Ao Root diam: 2.70 cm MITRAL VALVE                TRICUSPID VALVE MV Area (PHT): 4.15 cm     TR Peak grad:   12.7 mmHg MV Decel Time: 183 msec     TR Vmax:        178.00 cm/s MV E velocity: 112.00 cm/s MV A velocity: 65.60 cm/s   SHUNTS MV E/A ratio:  1.71         Systemic VTI:  0.21 m  Systemic Diam: 2.00 cm Yvonne Kendall MD Electronically signed by Yvonne Kendall MD Signature Date/Time: 09/19/2020/5:04:20 PM    Final         Scheduled Meds:  dicyclomine  20 mg Oral TID AC & HS   gabapentin  400 mg Oral TID   insulin aspart  0-15 Units Subcutaneous TID WC   insulin aspart  0-5 Units Subcutaneous QHS   pantoprazole  40 mg Oral Daily   polyethylene glycol  17 g Oral Daily   senna-docusate  1 tablet Oral BID   simethicone  80 mg Oral QID   Continuous Infusions:  sodium chloride 100 mL/hr at  09/21/20 0956     LOS: 3 days    Time spent: 25 minutes    Tresa Moore, MD [facility] Pager [phone]  If 7PM-7AM, please contact night-coverage 09/21/2020, 12:47 PM

## 2020-09-21 NOTE — Progress Notes (Signed)
Vitals entered manually ° °

## 2020-09-22 LAB — GLUCOSE, CAPILLARY
Glucose-Capillary: 131 mg/dL — ABNORMAL HIGH (ref 70–99)
Glucose-Capillary: 215 mg/dL — ABNORMAL HIGH (ref 70–99)
Glucose-Capillary: 255 mg/dL — ABNORMAL HIGH (ref 70–99)
Glucose-Capillary: 293 mg/dL — ABNORMAL HIGH (ref 70–99)

## 2020-09-22 MED ORDER — OXYCODONE HCL 5 MG PO TABS
10.0000 mg | ORAL_TABLET | ORAL | Status: DC | PRN
  Administered 2020-09-22 – 2020-09-23 (×7): 10 mg via ORAL
  Filled 2020-09-22 (×7): qty 2

## 2020-09-22 MED ORDER — PANTOPRAZOLE SODIUM 40 MG PO TBEC
40.0000 mg | DELAYED_RELEASE_TABLET | Freq: Two times a day (BID) | ORAL | Status: DC
  Administered 2020-09-22 – 2020-09-23 (×3): 40 mg via ORAL
  Filled 2020-09-22 (×3): qty 1

## 2020-09-22 NOTE — Progress Notes (Signed)
PROGRESS NOTE    Sabrina Hanna  HKV:[id number] DOB: 1975-09-25 DOA: 09/18/2020 PCP: Leotis Shames, MD   Brief Narrative:  45 year old female recent admission to [facility] for severe pancreatitis secondary to elevated triglycerides.  Status post plasmapheresis at [facility].  Presents to [facility] 1 week after discharge from [facility] with complaints of persistent abdominal and chest pain.  Imaging demonstrates pancreatic pseudocyst formation new from prior.  Patient was admitted for pain control.  GI consulted.  Per GI no intervention necessary for pancreatic pseudocyst.  If cyst wall is not mature than drain placement is not an option.  Patient is nonseptic and nontoxic appearing.  Apparently she was accepted by [facility] for transfer however they have no available bed so she was admitted to [facility] under hospitalist service.  She was seen in consultation by [facility] gastroenterology.  No intervention for pseudocyst is recommended.  From their standpoint no reason why patient can advance diet.  Patient continues to endorse abdominal pain and poor p.o. intake.  She is able to tolerate liquids however.  Patient continues to endorse severe 10 out of 10 abdominal pain however is often seen speaking on the phone with family or sleeping.  I explained to her the hazards of IV narcotic use and that we need to wean off of them.  I explained that her pancreas does not demonstrate signs of acute pancreatitis and the pseudocyst will resolve on their own over time.  No intervention is required.  Labs, vitals, lipase have been reassuring    Assessment & Plan:   Principal Problem:   Abdominal pain Active Problems:   HLD (hyperlipidemia)   GERD (gastroesophageal reflux disease)   Uncontrolled diabetes mellitus (HCC)   Acute pancreatitis without necrosis or infection, unspecified  Acute abdominal pain Pancreatic pseudocyst No radiographic evidence of acute pancreatitis Lipase and triglyceride level reassuring GI  consulted No intervention suggested for pseudocyst Possible contribution from mild constipation secondary to opioids Plan: No invasive intervention for pseudocyst Multimodal pain control, discontinue all IV narcotics Continue soft diet No specific indication for hospital hospital transfer.  If [facility] wants to except for continuity of care patient can transfer otherwise we will attempt to medically optimize and discharge home. Plan for discharge home 7/31  GERD PPI  Type 2 diabetes mellitus with hyperglycemia Sliding scale Repeat hemoglobin A1c Hold home metformin Consider addition of long-acting insulin once patient able to tolerate more p.o.  Hyperlipidemia Statin currently on hold  Acute on chronic anemia Unclear source No evidence of blood loss Normal coagulation studies Possible iron deficiency versus B12 deficiency Plan: Anemia work-up  Chest pain Do not suspect ACS VQ scan and echocardiogram normal       DVT prophylaxis: SCD Code Status: Full Family Communication: None today Disposition Plan: Status is: Inpatient  Remains inpatient appropriate because:Inpatient level of care appropriate due to severity of illness  Dispo: The patient is from: Home              Anticipated d/c is to: Home              Patient currently is not medically stable to d/c.   Difficult to place patient No  Patient with acute on chronic abdominal pain associated with pancreatic pseudocyst.  No radiographic evidence of acute pancreatitis.  Patient is apparently on transfer list from [facility] but has been sober 3days.  Vital signs and laboratory data is reassuring.  Patient continues to endorse abdominal pain though complaints are out of proportion to exam.  No  specific indication for transfer.  Will discontinue IV narcotics and if stable will discharge home 7/31    Level of care: Med-Surg  Consultants:  GI, signed off  Procedures:  None  Antimicrobials:   None   Subjective: Patient seen and examined.  Continues to endorse abdominal pain.  Vital stable.  Laboratory data reassuring.  Objective: Vitals:   09/21/20 1958 09/22/20 0518 09/22/20 0744 09/22/20 0953  BP: 115/77 104/72 97/67 96/63   Pulse: 75 68 71 76  Resp: 16 16 14    Temp: 97.8 F (36.6 C) 98.1 F (36.7 C) 97.9 F (36.6 C)   TempSrc:   Oral   SpO2: 98% 97% 97%   Weight:      Height:        Intake/Output Summary (Last 24 hours) at 09/22/2020 1158 Last data filed at 09/22/2020 1023 Gross per 24 hour  Intake 635 ml  Output --  Net 635 ml   Filed Weights   09/18/20 1444  Weight: 59 kg    Examination:  General exam: Sitting up in bed.  In no visible distress.  Continues to endorse abdominal pain Respiratory system: Clear to auscultation. Respiratory effort normal. Cardiovascular system: S1-S2, regular rate and rhythm, no murmurs, no pedal edema  gastrointestinal system: Soft, nondistended, mild TTP LUQ, normal bowel sounds Central nervous system: Alert and oriented. No focal neurological deficits. Extremities: Symmetric 5 x 5 power. Skin: No rashes, lesions or ulcers Psychiatry: Judgement and insight appear normal. Mood & affect appropriate.     Data Reviewed: I have personally reviewed following labs and imaging studies  CBC: Recent Labs  Lab 09/18/20 1446 09/19/20 0419 09/21/20 0754  WBC 6.0 5.4 5.3  NEUTROABS  --   --  2.5  HGB 8.8* 8.4* 8.6*  HCT 28.5* 26.2* 27.2*  MCV 91.3 91.3 91.0  PLT 588* 480* 392   Basic Metabolic Panel: Recent Labs  Lab 09/18/20 1446 09/19/20 0419 09/21/20 0754  NA 138 139 140  K 3.6 3.3* 3.4*  CL 105 110 109  CO2 23 23 27   GLUCOSE 132* 72 101*  BUN 10 7 <5*  CREATININE 0.45 0.41* <0.30*  CALCIUM 9.0 8.3* 8.4*   GFR: CrCl cannot be calculated (This lab value cannot be used to calculate CrCl because it is not a number: <0.30). Liver Function Tests: Recent Labs  Lab 09/18/20 1446 09/19/20 0419  AST 29 23   ALT 16 14  ALKPHOS 41 37*  BILITOT 0.6 0.4  PROT 8.1 6.8  ALBUMIN 3.7 3.1*   Recent Labs  Lab 09/18/20 1446 09/21/20 0754  LIPASE 40 33   No results for input(s): AMMONIA in the last 168 hours. Coagulation Profile: No results for input(s): INR, PROTIME in the last 168 hours. Cardiac Enzymes: No results for input(s): CKTOTAL, CKMB, CKMBINDEX, TROPONINI in the last 168 hours. BNP (last 3 results) No results for input(s): PROBNP in the last 8760 hours. HbA1C: No results for input(s): HGBA1C in the last 72 hours.  CBG: Recent Labs  Lab 09/21/20 1244 09/21/20 1701 09/21/20 2116 09/22/20 0851 09/22/20 1124  GLUCAP 148* 179* 234* 131* 215*   Lipid Profile: No results for input(s): CHOL, HDL, LDLCALC, TRIG, CHOLHDL, LDLDIRECT in the last 72 hours.  Thyroid Function Tests: No results for input(s): TSH, T4TOTAL, FREET4, T3FREE, THYROIDAB in the last 72 hours. Anemia Panel: No results for input(s): VITAMINB12, FOLATE, FERRITIN, TIBC, IRON, RETICCTPCT in the last 72 hours.  Sepsis Labs: No results for input(s): PROCALCITON, LATICACIDVEN in the last  168 hours.  Recent Results (from the past 240 hour(s))  Resp Panel by RT-PCR (Flu A&B, Covid) Nasopharyngeal Swab     Status: None   Collection Time: 09/18/20 10:02 PM   Specimen: Nasopharyngeal Swab; Nasopharyngeal(NP) swabs in vial transport medium  Result Value Ref Range Status   SARS Coronavirus 2 by RT PCR NEGATIVE NEGATIVE Final    Comment: (NOTE) SARS-CoV-2 target nucleic acids are NOT DETECTED.  The SARS-CoV-2 RNA is generally detectable in upper respiratory specimens during the acute phase of infection. The lowest concentration of SARS-CoV-2 viral copies this assay can detect is 138 copies/mL. A negative result does not preclude SARS-Cov-2 infection and should not be used as the sole basis for treatment or other patient management decisions. A negative result may occur with  improper specimen collection/handling,  submission of specimen other than nasopharyngeal swab, presence of viral mutation(s) within the areas targeted by this assay, and inadequate number of viral copies(<138 copies/mL). A negative result must be combined with clinical observations, patient history, and epidemiological information. The expected result is Negative.  Fact Sheet for Patients:  BloggerCourse.com  Fact Sheet for Healthcare Providers:  SeriousBroker.it  This test is no t yet approved or cleared by the [place] FDA and  has been authorized for detection and/or diagnosis of SARS-CoV-2 by FDA under an Emergency Use Authorization (EUA). This EUA will remain  in effect (meaning this test can be used) for the duration of the COVID-19 declaration under Section 564(b)(1) of the Act, 21 U.S.C.section 360bbb-3(b)(1), unless the authorization is terminated  or revoked sooner.       Influenza A by PCR NEGATIVE NEGATIVE Final   Influenza B by PCR NEGATIVE NEGATIVE Final    Comment: (NOTE) The Xpert Xpress SARS-CoV-2/FLU/RSV plus assay is intended as an aid in the diagnosis of influenza from Nasopharyngeal swab specimens and should not be used as a sole basis for treatment. Nasal washings and aspirates are unacceptable for Xpert Xpress SARS-CoV-2/FLU/RSV testing.  Fact Sheet for Patients: BloggerCourse.com  Fact Sheet for Healthcare Providers: SeriousBroker.it  This test is not yet approved or cleared by the [place] FDA and has been authorized for detection and/or diagnosis of SARS-CoV-2 by FDA under an Emergency Use Authorization (EUA). This EUA will remain in effect (meaning this test can be used) for the duration of the COVID-19 declaration under Section 564(b)(1) of the Act, 21 U.S.C. section 360bbb-3(b)(1), unless the authorization is terminated or revoked.  Performed at [facility], [address], [place], [place] [address]          Radiology Studies: No results found.      Scheduled Meds:  dicyclomine  20 mg Oral TID AC & HS   gabapentin  400 mg Oral TID   insulin aspart  0-15 Units Subcutaneous TID WC   insulin aspart  0-5 Units Subcutaneous QHS   pantoprazole  40 mg Oral BID   polyethylene glycol  17 g Oral Daily   senna-docusate  1 tablet Oral BID   simethicone  80 mg Oral QID   Continuous Infusions:     LOS: 4 days    Time spent: 25 minutes    Tresa Moore, MD [facility] Pager [phone]  If 7PM-7AM, please contact night-coverage 09/22/2020, 11:58 AM

## 2020-09-22 NOTE — Progress Notes (Signed)
Sent Dr. Georgeann Oppenheim a secure chat and let MD know that patient's blood pressure is 96/63 and patient asking for oxycodone and 10 mg ordered PRN. MD messaged back and stated it is okay to go ahead and give 10 mg oxycodone.

## 2020-09-22 NOTE — Plan of Care (Signed)
Patient continues to have persistent ABD pain. Poor PO intake. No acute changes.    Problem: Education: Goal: Knowledge of General Education information will improve Description: Including pain rating scale, medication(s)/side effects and non-pharmacologic comfort measures Outcome: Progressing   Problem: Health Behavior/Discharge Planning: Goal: Ability to manage health-related needs will improve Outcome: Progressing   Problem: Clinical Measurements: Goal: Ability to maintain clinical measurements within normal limits will improve Outcome: Progressing Goal: Will remain free from infection Outcome: Progressing Goal: Diagnostic test results will improve Outcome: Progressing Goal: Respiratory complications will improve Outcome: Progressing Goal: Cardiovascular complication will be avoided Outcome: Progressing   Problem: Activity: Goal: Risk for activity intolerance will decrease Outcome: Progressing   Problem: Nutrition: Goal: Adequate nutrition will be maintained Outcome: Progressing   Problem: Coping: Goal: Level of anxiety will decrease Outcome: Progressing   Problem: Elimination: Goal: Will not experience complications related to bowel motility Outcome: Progressing Goal: Will not experience complications related to urinary retention Outcome: Progressing   Problem: Pain Managment: Goal: General experience of comfort will improve Outcome: Progressing   Problem: Safety: Goal: Ability to remain free from injury will improve Outcome: Progressing   Problem: Skin Integrity: Goal: Risk for impaired skin integrity will decrease Outcome: Progressing

## 2020-09-23 LAB — GLUCOSE, CAPILLARY
Glucose-Capillary: 120 mg/dL — ABNORMAL HIGH (ref 70–99)
Glucose-Capillary: 154 mg/dL — ABNORMAL HIGH (ref 70–99)

## 2020-09-23 MED ORDER — OXYCODONE HCL 5 MG PO TABS: 5.0000 mg | ORAL_TABLET | ORAL | 0 refills | Status: AC | PRN

## 2020-09-23 MED ORDER — POLYETHYLENE GLYCOL 3350 17 G PO PACK: 17.0000 g | PACK | Freq: Every day | ORAL | 0 refills | Status: AC

## 2020-09-23 MED ORDER — ONDANSETRON HCL 4 MG PO TABS: 4.0000 mg | ORAL_TABLET | Freq: Three times a day (TID) | ORAL | 0 refills | Status: AC | PRN

## 2020-09-23 MED ORDER — SIMETHICONE 80 MG PO CHEW: 80.0000 mg | CHEWABLE_TABLET | Freq: Four times a day (QID) | ORAL | 0 refills | Status: AC

## 2020-09-23 MED ORDER — GABAPENTIN 300 MG PO CAPS: 300.0000 mg | ORAL_CAPSULE | Freq: Three times a day (TID) | ORAL | 0 refills | Status: AC

## 2020-09-23 MED ORDER — SENNOSIDES-DOCUSATE SODIUM 8.6-50 MG PO TABS: 1.0000 | ORAL_TABLET | Freq: Two times a day (BID) | ORAL | 0 refills | Status: AC

## 2020-09-23 MED ORDER — DICYCLOMINE HCL 20 MG PO TABS: 20.0000 mg | ORAL_TABLET | Freq: Three times a day (TID) | ORAL | 0 refills | Status: AC

## 2020-09-23 MED ORDER — PANTOPRAZOLE SODIUM 40 MG PO TBEC: 40.0000 mg | DELAYED_RELEASE_TABLET | Freq: Two times a day (BID) | ORAL | 0 refills | Status: AC

## 2020-09-23 NOTE — Plan of Care (Signed)
Patient discharged home per MD orders at this time.All discharge instructions,education and medications reviewed with patient at bedside.Pt expressed understanding and will comply with dc instructions.follow up appointments and medication pick up was also communicated to patient.no verbal c/o or any ssx of distress.patient was transported home by daughter in a private car.

## 2020-09-23 NOTE — Discharge Summary (Signed)
Physician Discharge Summary  Sabrina Hanna ZOX:[id number] DOB: Feb 10, 1976 DOA: 09/18/2020  PCP: Sabrina Shames, MD  Admit date: 09/18/2020 Discharge date: 09/23/2020  Admitted From: Home Disposition: Home  Recommendations for Outpatient Follow-up:  Follow up with PCP in 1-2 weeks Consider GI referral/follow-up  Home Health: No Equipment/Devices: None  Discharge Condition: Stable CODE STATUS: Full Diet recommendation: Soft/bland  Brief/Interim Summary:  45 year old female recent admission to [facility] for severe pancreatitis secondary to elevated triglycerides.  Status post plasmapheresis at [facility].  Presents to [facility] 1 week after discharge from [facility] with complaints of persistent abdominal and chest pain.  Imaging demonstrates pancreatic pseudocyst formation new from prior.  Patient was admitted for pain control.  GI consulted.   Per GI no intervention necessary for pancreatic pseudocyst.  If cyst wall is not mature than drain placement is not an option.  Patient is nonseptic and nontoxic appearing.  Apparently she was accepted by [facility] for transfer however they have no available bed so she was admitted to [facility] under hospitalist service.   She was seen in consultation by [facility] gastroenterology.  No intervention for pseudocyst is recommended.  From their standpoint no reason why patient can advance diet.  Patient continues to endorse abdominal pain and poor p.o. intake.  She is able to tolerate liquids however.   Patient continues to endorse severe 10 out of 10 abdominal pain however is often seen speaking on the phone with family or sleeping.  I explained to her the hazards of IV narcotic use and that we need to wean off of them.  I explained that her pancreas does not demonstrate signs of acute pancreatitis and the pseudocyst will resolve on their own over time.  No intervention is required.  Labs, vitals, lipase have been reassuring  On day of discharge patient remains hemodynamically  stable.  Continues to endorse poor p.o. intake and abdominal pain.  Labs and vitals are reassuring.  Explained that she does not have evidence of acute pancreatitis and her pain was likely caused by pancreatic pseudocyst formation which can occur after a bout of severe pancreatitis.  Per GI her symptoms may take weeks to resolve.  Multimodal pain regimen prescribed on discharge.  Follow-up outpatient PCP.  Consider referral to GI for follow-up.  Discharge Diagnoses:  Principal Problem:   Abdominal pain Active Problems:   HLD (hyperlipidemia)   GERD (gastroesophageal reflux disease)   Uncontrolled diabetes mellitus (HCC)   Acute pancreatitis without necrosis or infection, unspecified  Acute abdominal pain Pancreatic pseudocyst No radiographic evidence of acute pancreatitis Lipase and triglyceride level reassuring GI consulted No intervention suggested for pseudocyst Possible contribution from mild constipation secondary to opioids Plan: Stable for discharge at this time.  Multimodal pain control prescribed.  As needed Zofran and bowel regimen also prescribed.  Follow-up outpatient PCP.  Continue with soft bland diet for next several weeks.   GERD PPI   Type 2 diabetes mellitus with hyperglycemia Can resume home regimen   Hyperlipidemia Statin currently on hold, resumed on discharge   Acute on chronic anemia Unclear source No evidence of blood loss Normal coagulation studies Possible iron deficiency versus B12 deficiency Outpatient follow-up   Chest pain Do not suspect ACS VQ scan and echocardiogram normal  Discharge Instructions  Discharge Instructions     Diet - low sodium heart healthy   Complete by: As directed    Diet - low sodium heart healthy   Complete by: As directed    Soft, bland foods, small meals  Increase activity slowly   Complete by: As directed    Increase activity slowly   Complete by: As directed       Allergies as of 09/23/2020        Reactions   Latex Rash   Penicillin G Rash   Penicillins Rash, Other (See Comments)   Has patient had a PCN reaction causing immediate rash, facial/tongue/throat swelling, SOB or lightheadedness with hypotension: No Has patient had a PCN reaction causing severe rash involving mucus membranes or skin necrosis: No Has patient had a PCN reaction that required hospitalization No Has patient had a PCN reaction occurring within the last 10 years: No If all of the above answers are "NO", then may proceed with Cephalosporin use.        Medication List     TAKE these medications    dicyclomine 20 MG tablet Commonly known as: BENTYL Take 1 tablet (20 mg total) by mouth 4 (four) times daily -  before meals and at bedtime.   fenofibrate 145 MG tablet Commonly known as: TRICOR Take 145 mg by mouth daily.   gabapentin 300 MG capsule Commonly known as: NEURONTIN Take 1 capsule (300 mg total) by mouth 3 (three) times daily for 14 days.   HumaLOG 100 UNIT/ML injection Generic drug: insulin lispro Inject 12-16 Units into the skin 3 (three) times daily.   Lantus SoloStar 100 UNIT/ML Solostar Pen Generic drug: insulin glargine Inject 40 Units into the skin every evening.   metFORMIN 500 MG tablet Commonly known as: GLUCOPHAGE Take 1,000 mg by mouth 2 (two) times daily.   NovoLOG FlexPen 100 UNIT/ML FlexPen Generic drug: insulin aspart Inject 5-15 Units into the skin 3 (three) times daily with meals. Uses sliding scale   Omega-3 1000 MG Caps Take 1,000 mg by mouth in the morning and at bedtime.   ondansetron 4 MG tablet Commonly known as: Zofran Take 1 tablet (4 mg total) by mouth every 8 (eight) hours as needed for nausea or vomiting.   oxyCODONE 5 MG immediate release tablet Commonly known as: Oxy IR/ROXICODONE Take 1 tablet (5 mg total) by mouth every 4 (four) hours as needed for up to 5 days for moderate pain.   pantoprazole 40 MG tablet Commonly known as: Protonix Take 1  tablet (40 mg total) by mouth 2 (two) times daily before a meal. What changed: when to take this   polyethylene glycol 17 g packet Commonly known as: MIRALAX / GLYCOLAX Take 17 g by mouth daily.   rosuvastatin 40 MG tablet Commonly known as: CRESTOR Take 40 mg by mouth daily.   senna-docusate 8.6-50 MG tablet Commonly known as: Senokot-S Take 1 tablet by mouth 2 (two) times daily.   simethicone 80 MG chewable tablet Commonly known as: MYLICON Chew 1 tablet (80 mg total) by mouth 4 (four) times daily.        Allergies  Allergen Reactions   Latex Rash   Penicillin G Rash   Penicillins Rash and Other (See Comments)    Has patient had a PCN reaction causing immediate rash, facial/tongue/throat swelling, SOB or lightheadedness with hypotension: No Has patient had a PCN reaction causing severe rash involving mucus membranes or skin necrosis: No Has patient had a PCN reaction that required hospitalization No Has patient had a PCN reaction occurring within the last 10 years: No If all of the above answers are "NO", then may proceed with Cephalosporin use.     Consultations: GI   Procedures/Studies: DG Chest  2 View  Result Date: 09/18/2020 CLINICAL DATA:  Abdominal and chest pain for 4 weeks. EXAM: CHEST - 2 VIEW COMPARISON:  PA and lateral chest 02/22/2020. FINDINGS: Small left pleural effusion and basilar airspace disease are seen. No right effusion. The right lung is clear. Heart size is normal. No pneumothorax. No acute or focal bony abnormality. IMPRESSION: Small left pleural effusion and basilar airspace disease which is likely atelectasis. Electronically Signed   By: Drusilla Kanner M.D.   On: 09/18/2020 18:57   NM Pulmonary Perfusion  Result Date: 09/19/2020 CLINICAL DATA:  Shortness of breath and chest pain. EXAM: NUCLEAR MEDICINE PERFUSION LUNG SCAN TECHNIQUE: Perfusion images were obtained in multiple projections after intravenous injection of radiopharmaceutical.  Ventilation scans intentionally deferred if perfusion scan and chest x-ray adequate for interpretation during COVID 19 epidemic. RADIOPHARMACEUTICALS:  4.46 mCi Tc-79m MAA IV COMPARISON:  Chest radiograph from 09/19/2020. FINDINGS: No peripheral segmental wedge-shaped perfusion defects identified to suggest acute pulmonary embolus. Asymmetric, nonsegmental decreased tracer activity localizing to the left lower lobe corresponds to left pleural effusion identified on chest radiograph from today. IMPRESSION: 1. No evidence for acute pulmonary embolus. Electronically Signed   By: Signa Kell M.D.   On: 09/19/2020 15:36   CT ABDOMEN PELVIS W CONTRAST  Result Date: 09/18/2020 CLINICAL DATA:  Abdominal and chest pain for approximately 4 weeks. The patient was diagnosed with pancreatitis and admitted to another hospital 3 weeks ago. EXAM: CT ABDOMEN AND PELVIS WITH CONTRAST TECHNIQUE: Multidetector CT imaging of the abdomen and pelvis was performed using the standard protocol following bolus administration of intravenous contrast. CONTRAST:  75 mL OMNIPAQUE IOHEXOL 300 MG/ML  SOLN COMPARISON:  CT abdomen and pelvis 08/27/2020. FINDINGS: Lower chest: The patient has a new small left pleural effusion and associated compressive left basilar atelectasis. No right effusion or pericardial effusion. Imaged right lung is clear. Hepatobiliary: No focal liver abnormality is seen. No gallstones, gallbladder wall thickening, or biliary dilatation. Pancreas: A new fluid collection which surrounds the body and extends lateral to the tail of the pancreas measures approximately 12 cm craniocaudal by 5 cm AP by 13 cm transverse and is compatible with a pseudocyst. A second fluid collection lateral to the head of the pancreas is extends a total of 8.2 cm craniocaudal and measures up to 2.9 by 1.8 cm in the axial plane. The pancreas enhances homogeneously. Spleen: Normal in size without focal abnormality. Adrenals/Urinary Tract:  Adrenal glands are unremarkable. Kidneys are normal, without renal calculi, focal lesion, or hydronephrosis. Bladder is unremarkable. Stomach/Bowel: Stomach is within normal limits. No evidence of appendicitis. No evidence of bowel wall thickening, distention, or inflammatory changes. Vascular/Lymphatic: No significant vascular findings are present. No enlarged abdominal or pelvic lymph nodes. Reproductive: Status post hysterectomy. No adnexal masses. Other: None. Musculoskeletal: Negative. IMPRESSION: Two new fluid collections about the pancreas described above are consistent with pseudocysts. The pancreas appears normal. Negative for pancreatic necrosis. Small left pleural effusion and associated compressive atelectasis are new since the prior CT. Electronically Signed   By: Drusilla Kanner M.D.   On: 09/18/2020 17:08   CT ABDOMEN PELVIS W CONTRAST  Result Date: 08/27/2020 CLINICAL DATA:  Severe periumbilical and epigastric pain. Concern for pancreatitis. EXAM: CT ABDOMEN AND PELVIS WITH CONTRAST TECHNIQUE: Multidetector CT imaging of the abdomen and pelvis was performed using the standard protocol following bolus administration of intravenous contrast. CONTRAST:  OMNIPAQUE IOHEXOL 300 MG/ML  SOLN COMPARISON:  CT 02/28/2020 02/26/2020 FINDINGS: Lower chest: Lung bases are  clear. Hepatobiliary: Low-attenuation in the liver suggestive of hepatic steatosis gallbladder normal. No biliary duct dilatation. No radiodense gallstones. No gallbladder distension. Common bile duct is normal caliber. There is subtle enhancement of the mucosa of the common hepatic duct (image 32/series 4/coronal Pancreas: There is a rim of low-density fluid surrounding the head and mid body of the pancreas. There is no organized fluid collections. There is mild heterogeneous hypoenhancement of the head of the pancreas (image 37/2). Body and tail the pancreas enhance normally. There is no pancreatic duct dilatation. Subtle enhancement  of the common bile duct in the head of the pancreas (image 35/2) No evidence of vascular complication associated with pancreatitis Spleen: Normal spleen Adrenals/urinary tract: Adrenal glands and kidneys are normal. The ureters and bladder normal. Stomach/Bowel: Stomach, small-bowel and cecum are normal. The appendix is not identified but there is no pericecal inflammation to suggest appendicitis. The colon and rectosigmoid colon are normal. Vascular/Lymphatic: Abdominal aorta is normal caliber. No periportal or retroperitoneal adenopathy. No pelvic adenopathy. Reproductive: Post hysterectomy.  Adnexa unremarkable Other: No free fluid the pelvis.  No ascites. Musculoskeletal: No aggressive osseous lesion. IMPRESSION: 1. Mild acute pancreatitis involving the head and mid body of the pancreas. No organized fluid collections. Mild hypoenhancement of head of pancreas; the body and tail of the pancreas enhance normally. 2. Mild mucosal enhancement of the common hepatic duct and common bile duct. Cannot exclude subtle cholangitis. No biliary duct dilatation. 3. Gallbladder normal by CT imaging.  No radiodense stones. 4. Hepatic steatosis Electronically Signed   By: Genevive Bi M.D.   On: 08/27/2020 10:05   DG Chest Port 1 View  Result Date: 09/19/2020 CLINICAL DATA:  Chest pain.  Shortness of breath. EXAM: PORTABLE CHEST 1 VIEW COMPARISON:  09/18/2020. FINDINGS: Mediastinum is normal. Mild cardiomegaly. Interim progression of left mid and left base lung infiltrates. Stable small left pleural effusion. No pneumothorax. IMPRESSION: 1. Interim progression of left mid and left lung base infiltrates. Stable small left pleural effusion. 2.  Mild cardiomegaly. Electronically Signed   By: Maisie Fus  Register   On: 09/19/2020 11:24   ECHOCARDIOGRAM COMPLETE  Result Date: 09/19/2020    ECHOCARDIOGRAM REPORT   Patient Name:   Sabrina Hanna Date of Exam: 09/19/2020 Medical Rec #:  [id number]            Height:        60.0 in Accession #:    [id number]           Weight:       130.0 lb Date of Birth:  1975-09-21            BSA:          1.554 m Patient Age:    45 years             BP:           100/64 mmHg Patient Gender: F                    HR:           60 bpm. Exam Location:  [facility] Procedure: 2D Echo, Cardiac Doppler, Color Doppler and Strain Analysis Indications:     Chest pain R07.9  History:         Patient has no prior history of Echocardiogram examinations.                  Risk Factors:Diabetes.  Sonographer:     Cristela Blue RDCS (AE)  Referring Phys:  [id number] Eliezer Mccoy PATEL Diagnosing Phys: Yvonne Kendall MD  Sonographer Comments: Global longitudinal strain was attempted. IMPRESSIONS  1. Left ventricular ejection fraction, by estimation, is 60 to 65%. The left ventricle has normal function. The left ventricle has no regional wall motion abnormalities. Left ventricular diastolic parameters were normal. The average left ventricular global longitudinal strain is -24.5 %. The global longitudinal strain is normal.  2. Right ventricular systolic function is normal. The right ventricular size is normal. There is normal pulmonary artery systolic pressure.  3. Left atrial size was mildly dilated.  4. The mitral valve is normal in structure. No evidence of mitral valve regurgitation. No evidence of mitral stenosis.  5. The aortic valve has an indeterminant number of cusps. Aortic valve regurgitation is not visualized. No aortic stenosis is present.  6. The inferior vena cava is normal in size with greater than 50% respiratory variability, suggesting right atrial pressure of 3 mmHg. FINDINGS  Left Ventricle: Left ventricular ejection fraction, by estimation, is 60 to 65%. The left ventricle has normal function. The left ventricle has no regional wall motion abnormalities. The average left ventricular global longitudinal strain is -24.5 %. The global longitudinal strain is normal. The left ventricular internal cavity size was normal in  size. There is no left ventricular hypertrophy. Left ventricular diastolic parameters were normal. Right Ventricle: The right ventricular size is normal. No increase in right ventricular wall thickness. Right ventricular systolic function is normal. There is normal pulmonary artery systolic pressure. The tricuspid regurgitant velocity is 1.78 m/s, and  with an assumed right atrial pressure of 3 mmHg, the estimated right ventricular systolic pressure is 15.7 mmHg. Left Atrium: Left atrial size was mildly dilated. Right Atrium: Right atrial size was normal in size. Pericardium: There is no evidence of pericardial effusion. Mitral Valve: The mitral valve is normal in structure. No evidence of mitral valve regurgitation. No evidence of mitral valve stenosis. Tricuspid Valve: The tricuspid valve is normal in structure. Tricuspid valve regurgitation is trivial. Aortic Valve: The aortic valve has an indeterminant number of cusps. Aortic valve regurgitation is not visualized. No aortic stenosis is present. Aortic valve mean gradient measures 5.0 mmHg. Aortic valve peak gradient measures 8.2 mmHg. Aortic valve area, by VTI measures 2.18 cm. Pulmonic Valve: The pulmonic valve was grossly normal. Pulmonic valve regurgitation is not visualized. No evidence of pulmonic stenosis. Aorta: The aortic root is normal in size and structure. Pulmonary Artery: The pulmonary artery is of normal size. Venous: The inferior vena cava is normal in size with greater than 50% respiratory variability, suggesting right atrial pressure of 3 mmHg. IAS/Shunts: No atrial level shunt detected by color flow Doppler.  LEFT VENTRICLE PLAX 2D LVIDd:         3.80 cm  Diastology LVIDs:         2.40 cm  LV e' medial:    8.16 cm/s LV PW:         1.00 cm  LV E/e' medial:  13.7 LV IVS:        0.79 cm  LV e' lateral:   14.70 cm/s LVOT diam:     2.00 cm  LV E/e' lateral: 7.6 LV SV:         66 LV SV Index:   42       2D Longitudinal Strain LVOT Area:     3.14 cm  2D Strain GLS Avg:     -24.5 %  3D Volume EF:                         3D EF:        69 %                         LV EDV:       129 ml                         LV ESV:       39 ml                         LV SV:        89 ml RIGHT VENTRICLE RV Basal diam:  3.60 cm RV S prime:     12.00 cm/s LEFT ATRIUM           Index       RIGHT ATRIUM           Index LA diam:      3.40 cm 2.19 cm/m  RA Area:     14.70 cm LA Vol (A4C): 54.3 ml 34.93 ml/m RA Volume:   37.00 ml  23.80 ml/m  AORTIC VALVE                    PULMONIC VALVE AV Area (Vmax):    2.18 cm     PV Vmax:        0.76 m/s AV Area (Vmean):   2.07 cm     PV Peak grad:   2.3 mmHg AV Area (VTI):     2.18 cm     RVOT Peak grad: 4 mmHg AV Vmax:           143.00 cm/s AV Vmean:          103.000 cm/s AV VTI:            0.301 m AV Peak Grad:      8.2 mmHg AV Mean Grad:      5.0 mmHg LVOT Vmax:         99.20 cm/s LVOT Vmean:        67.800 cm/s LVOT VTI:          0.209 m LVOT/AV VTI ratio: 0.69  AORTA Ao Root diam: 2.70 cm MITRAL VALVE                TRICUSPID VALVE MV Area (PHT): 4.15 cm     TR Peak grad:   12.7 mmHg MV Decel Time: 183 msec     TR Vmax:        178.00 cm/s MV E velocity: 112.00 cm/s MV A velocity: 65.60 cm/s   SHUNTS MV E/A ratio:  1.71         Systemic VTI:  0.21 m                             Systemic Diam: 2.00 cm Yvonne Kendallhristopher End MD Electronically signed by Yvonne Kendallhristopher End MD Signature Date/Time: 09/19/2020/5:04:20 PM    Final    US ABDOMEN LIMITED RUQ (LIVER/GB)  Result Date: 09/19/2020 CLINICAL DATA:  Initial evaluation for acute pancreatitis. EXAM: ULTRASOUND ABDOMEN LIMITED RIGHT UPPER QUADRANT COMPARISON:  CT from earlier the same day. FINDINGS: Gallbladder: Small amount of echogenic sludge seen layering within the gallbladder  lumen. No frank cholelithiasis. Small 3 mm polyp seen adherent to the gallbladder wall. Gallbladder wall measures within normal limits at 1.4 mm. No sonographic Murphy sign elicited on exam. Common  bile duct: Diameter: 4.3 mm Liver: No focal lesion identified. Diffusely increased echogenicity within the hepatic parenchyma. Portal vein is patent on color Doppler imaging with normal direction of blood flow towards the liver. Other: Scattered small volume free fluid present within the right upper quadrant. IMPRESSION: 1. Gallbladder sludge without cholelithiasis or evidence for acute cholecystitis. No biliary dilatation. 2. Diffusely increased echogenicity within the hepatic parenchyma, suggesting steatosis. 3. Small volume free fluid within the right upper quadrant. 4. Incidental 3 mm gallbladder polyp, almost certainly benign given size. No follow-up imaging recommended. Electronically Signed   By: Rise Mu M.D.   On: 09/19/2020 01:20   (Echo, Carotid, EGD, Colonoscopy, ERCP)    Subjective: Seen and examined the day of discharge.  Stable no distress.  Stable for discharge home.  Discharge Exam: Vitals:   09/23/20 0347 09/23/20 0807  BP: 101/62 96/61  Pulse: 72 69  Resp: 16 16  Temp: 97.7 F (36.5 C) 98.2 F (36.8 C)  SpO2: 98% 98%   Vitals:   09/22/20 1639 09/22/20 2041 09/23/20 0347 09/23/20 0807  BP: 111/72 106/71 101/62 96/61  Pulse: 67 64 72 69  Resp: 16 17 16 16   Temp: 97.9 F (36.6 C) 98.1 F (36.7 C) 97.7 F (36.5 C) 98.2 F (36.8 C)  TempSrc: Oral     SpO2: 97% 97% 98% 98%  Weight:      Height:        General: Pt is alert, awake, not in acute distress Cardiovascular: RRR, S1/S2 +, no rubs, no gallops Respiratory: CTA bilaterally, no wheezing, no rhonchi Abdominal: Soft, NT, ND, bowel sounds + Extremities: no edema, no cyanosis    The results of significant diagnostics from this hospitalization (including imaging, microbiology, ancillary and laboratory) are listed below for reference.     Microbiology: Recent Results (from the past 240 hour(s))  Resp Panel by RT-PCR (Flu A&B, Covid) Nasopharyngeal Swab     Status: None   Collection Time:  09/18/20 10:02 PM   Specimen: Nasopharyngeal Swab; Nasopharyngeal(NP) swabs in vial transport medium  Result Value Ref Range Status   SARS Coronavirus 2 by RT PCR NEGATIVE NEGATIVE Final    Comment: (NOTE) SARS-CoV-2 target nucleic acids are NOT DETECTED.  The SARS-CoV-2 RNA is generally detectable in upper respiratory specimens during the acute phase of infection. The lowest concentration of SARS-CoV-2 viral copies this assay can detect is 138 copies/mL. A negative result does not preclude SARS-Cov-2 infection and should not be used as the sole basis for treatment or other patient management decisions. A negative result may occur with  improper specimen collection/handling, submission of specimen other than nasopharyngeal swab, presence of viral mutation(s) within the areas targeted by this assay, and inadequate number of viral copies(<138 copies/mL). A negative result must be combined with clinical observations, patient history, and epidemiological information. The expected result is Negative.  Fact Sheet for Patients:  09/20/20  Fact Sheet for Healthcare Providers:  BloggerCourse.com  This test is no t yet approved or cleared by the [place] FDA and  has been authorized for detection and/or diagnosis of SARS-CoV-2 by FDA under an Emergency Use Authorization (EUA). This EUA will remain  in effect (meaning this test can be used) for the duration of the COVID-19 declaration under Section 564(b)(1) of the Act, 21 U.S.C.section  360bbb-3(b)(1), unless the authorization is terminated  or revoked sooner.       Influenza A by PCR NEGATIVE NEGATIVE Final   Influenza B by PCR NEGATIVE NEGATIVE Final    Comment: (NOTE) The Xpert Xpress SARS-CoV-2/FLU/RSV plus assay is intended as an aid in the diagnosis of influenza from Nasopharyngeal swab specimens and should not be used as a sole basis for treatment. Nasal washings  and aspirates are unacceptable for Xpert Xpress SARS-CoV-2/FLU/RSV testing.  Fact Sheet for Patients: BloggerCourse.com  Fact Sheet for Healthcare Providers: SeriousBroker.it  This test is not yet approved or cleared by the [place] FDA and has been authorized for detection and/or diagnosis of SARS-CoV-2 by FDA under an Emergency Use Authorization (EUA). This EUA will remain in effect (meaning this test can be used) for the duration of the COVID-19 declaration under Section 564(b)(1) of the Act, 21 U.S.C. section 360bbb-3(b)(1), unless the authorization is terminated or revoked.  Performed at [facility], [address] [place], [place] [address]      Labs: BNP (last 3 results) Recent Labs    10/12/19 0300  BNP 11.8   Basic Metabolic Panel: Recent Labs  Lab 09/18/20 1446 09/19/20 0419 09/21/20 0754  NA 138 139 140  K 3.6 3.3* 3.4*  CL 105 110 109  CO2 GLUCOSE 132* 72 101*  BUN 10 7 <5*  CREATININE 0.45 0.41* <0.30*  CALCIUM 9.0 8.3* 8.4*   Liver Function Tests: Recent Labs  Lab 09/18/20 1446 09/19/20 0419  AST 29 23  ALT 16 14  ALKPHOS 41 37*  BILITOT 0.6 0.4  PROT 8.1 6.8  ALBUMIN 3.7 3.1*   Recent Labs  Lab 09/18/20 1446 09/21/20 0754  LIPASE 40 33   No results for input(s): AMMONIA in the last 168 hours. CBC: Recent Labs  Lab 09/18/20 1446 09/19/20 0419 09/21/20 0754  WBC 6.0 5.4 5.3  NEUTROABS  --   --  2.5  HGB 8.8* 8.4* 8.6*  HCT 28.5* 26.2* 27.2*  MCV 91.3 91.3 91.0  PLT 588* 480* 392   Cardiac Enzymes: No results for input(s): CKTOTAL, CKMB, CKMBINDEX, TROPONINI in the last 168 hours. BNP: Invalid input(s): POCBNP CBG: Recent Labs  Lab 09/22/20 0851 09/22/20 1124 09/22/20 1623 09/22/20 2043 09/23/20 0803  GLUCAP 131* 215* 255* 293* 120*   D-Dimer No results for input(s): DDIMER in the last 72 hours. Hgb A1c No results for input(s): HGBA1C in  the last 72 hours. Lipid Profile No results for input(s): CHOL, HDL, LDLCALC, TRIG, CHOLHDL, LDLDIRECT in the last 72 hours. Thyroid function studies No results for input(s): TSH, T4TOTAL, T3FREE, THYROIDAB in the last 72 hours.  Invalid input(s): FREET3 Anemia work up No results for input(s): VITAMINB12, FOLATE, FERRITIN, TIBC, IRON, RETICCTPCT in the last 72 hours. Urinalysis    Component Value Date/Time   COLORURINE YELLOW (A) 09/18/2020 1446   APPEARANCEUR HAZY (A) 09/18/2020 1446   LABSPEC 1.028 09/18/2020 1446   PHURINE 5.0 09/18/2020 1446   GLUCOSEU NEGATIVE 09/18/2020 1446   HGBUR NEGATIVE 09/18/2020 1446   BILIRUBINUR NEGATIVE 09/18/2020 1446   KETONESUR 5 (A) 09/18/2020 1446   PROTEINUR 30 (A) 09/18/2020 1446   NITRITE NEGATIVE 09/18/2020 1446   LEUKOCYTESUR NEGATIVE 09/18/2020 1446   Sepsis Labs Invalid input(s): PROCALCITONIN,  WBC,  LACTICIDVEN Microbiology Recent Results (from the past 240 hour(s))  Resp Panel by RT-PCR (Flu A&B, Covid) Nasopharyngeal Swab     Status: None   Collection Time: 09/18/20 10:02 PM  Specimen: Nasopharyngeal Swab; Nasopharyngeal(NP) swabs in vial transport medium  Result Value Ref Range Status   SARS Coronavirus 2 by RT PCR NEGATIVE NEGATIVE Final    Comment: (NOTE) SARS-CoV-2 target nucleic acids are NOT DETECTED.  The SARS-CoV-2 RNA is generally detectable in upper respiratory specimens during the acute phase of infection. The lowest concentration of SARS-CoV-2 viral copies this assay can detect is 138 copies/mL. A negative result does not preclude SARS-Cov-2 infection and should not be used as the sole basis for treatment or other patient management decisions. A negative result may occur with  improper specimen collection/handling, submission of specimen other than nasopharyngeal swab, presence of viral mutation(s) within the areas targeted by this assay, and inadequate number of viral copies(<138 copies/mL). A negative result  must be combined with clinical observations, patient history, and epidemiological information. The expected result is Negative.  Fact Sheet for Patients:  BloggerCourse.com  Fact Sheet for Healthcare Providers:  SeriousBroker.it  This test is no t yet approved or cleared by the [place] FDA and  has been authorized for detection and/or diagnosis of SARS-CoV-2 by FDA under an Emergency Use Authorization (EUA). This EUA will remain  in effect (meaning this test can be used) for the duration of the COVID-19 declaration under Section 564(b)(1) of the Act, 21 U.S.C.section 360bbb-3(b)(1), unless the authorization is terminated  or revoked sooner.       Influenza A by PCR NEGATIVE NEGATIVE Final   Influenza B by PCR NEGATIVE NEGATIVE Final    Comment: (NOTE) The Xpert Xpress SARS-CoV-2/FLU/RSV plus assay is intended as an aid in the diagnosis of influenza from Nasopharyngeal swab specimens and should not be used as a sole basis for treatment. Nasal washings and aspirates are unacceptable for Xpert Xpress SARS-CoV-2/FLU/RSV testing.  Fact Sheet for Patients: BloggerCourse.com  Fact Sheet for Healthcare Providers: SeriousBroker.it  This test is not yet approved or cleared by the [place] FDA and has been authorized for detection and/or diagnosis of SARS-CoV-2 by FDA under an Emergency Use Authorization (EUA). This EUA will remain in effect (meaning this test can be used) for the duration of the COVID-19 declaration under Section 564(b)(1) of the Act, 21 U.S.C. section 360bbb-3(b)(1), unless the authorization is terminated or revoked.  Performed at [facility], [address] [place], [place] [address]      Time coordinating discharge: Over 30 minutes  SIGNED:   Tresa Moore, MD  [facility] 09/23/2020, 12:15 PM Pager   If 7PM-7AM,  please contact night-coverage

## 2020-11-06 ENCOUNTER — Other Ambulatory Visit: Payer: Self-pay | Admitting: Gastroenterology

## 2020-11-06 DIAGNOSIS — K862 Cyst of pancreas: Secondary | ICD-10-CM

## 2020-11-15 ENCOUNTER — Ambulatory Visit: Admission: RE | Admit: 2020-11-15 | Payer: Medicaid Other | Source: Ambulatory Visit

## 2020-11-30 ENCOUNTER — Ambulatory Visit
Admission: RE | Admit: 2020-11-30 | Discharge: 2020-11-30 | Disposition: A | Discharge status: Home / Self Care | Payer: Medicaid Other | Source: Ambulatory Visit | Attending: Gastroenterology | Admitting: Gastroenterology

## 2020-11-30 ENCOUNTER — Other Ambulatory Visit: Payer: Self-pay

## 2020-11-30 DIAGNOSIS — K862 Cyst of pancreas: Secondary | ICD-10-CM | POA: Insufficient documentation

## 2020-11-30 MED ORDER — GADOBUTROL 1 MMOL/ML IV SOLN
6.0000 mL | Freq: Once | INTRAVENOUS | Status: AC | PRN
  Administered 2020-11-30: 6 mL via INTRAVENOUS

## 2021-06-05 ENCOUNTER — Other Ambulatory Visit: Payer: Self-pay | Admitting: Gastroenterology

## 2021-06-05 DIAGNOSIS — K862 Cyst of pancreas: Secondary | ICD-10-CM

## 2021-06-24 ENCOUNTER — Ambulatory Visit: Payer: Medicaid Other

## 2021-07-25 ENCOUNTER — Ambulatory Visit: Payer: Medicaid Other

## 2021-08-06 ENCOUNTER — Ambulatory Visit
Admission: RE | Admit: 2021-08-06 | Discharge: 2021-08-06 | Disposition: A | Discharge status: Home / Self Care | Payer: Medicaid Other | Source: Ambulatory Visit | Attending: Gastroenterology | Admitting: Gastroenterology

## 2021-08-06 DIAGNOSIS — K862 Cyst of pancreas: Secondary | ICD-10-CM | POA: Insufficient documentation

## 2021-08-06 MED ORDER — GADOBUTROL 1 MMOL/ML IV SOLN
5.0000 mL | Freq: Once | INTRAVENOUS | Status: AC | PRN
  Administered 2021-08-06: 5 mL via INTRAVENOUS

## 2021-09-05 ENCOUNTER — Encounter: Payer: Self-pay | Admitting: Emergency Medicine

## 2021-09-05 ENCOUNTER — Other Ambulatory Visit: Payer: Self-pay

## 2021-09-05 ENCOUNTER — Emergency Department: Payer: Medicaid Other

## 2021-09-05 ENCOUNTER — Emergency Department
Admission: EM | Admit: 2021-09-05 | Discharge: 2021-09-05 | Disposition: A | Discharge status: Home / Self Care | Payer: Medicaid Other | Attending: Emergency Medicine | Admitting: Emergency Medicine

## 2021-09-05 DIAGNOSIS — Z8616 Personal history of COVID-19: Secondary | ICD-10-CM | POA: Diagnosis not present

## 2021-09-05 DIAGNOSIS — R1084 Generalized abdominal pain: Secondary | ICD-10-CM | POA: Insufficient documentation

## 2021-09-05 DIAGNOSIS — E119 Type 2 diabetes mellitus without complications: Secondary | ICD-10-CM | POA: Diagnosis not present

## 2021-09-05 DIAGNOSIS — R630 Anorexia: Secondary | ICD-10-CM | POA: Diagnosis not present

## 2021-09-05 DIAGNOSIS — R110 Nausea: Secondary | ICD-10-CM | POA: Insufficient documentation

## 2021-09-05 DIAGNOSIS — K259 Gastric ulcer, unspecified as acute or chronic, without hemorrhage or perforation: Secondary | ICD-10-CM

## 2021-09-05 LAB — CBC WITH DIFFERENTIAL/PLATELET
Abs Immature Granulocytes: 0.03 10*3/uL (ref 0.00–0.07)
Basophils Absolute: 0 10*3/uL (ref 0.0–0.1)
Basophils Relative: 0 %
Eosinophils Absolute: 0.1 10*3/uL (ref 0.0–0.5)
Eosinophils Relative: 2 %
HCT: 39.9 % (ref 36.0–46.0)
Hemoglobin: 12.9 g/dL (ref 12.0–15.0)
Immature Granulocytes: 0 %
Lymphocytes Relative: 32 %
Lymphs Abs: 3.1 10*3/uL (ref 0.7–4.0)
MCH: 28 pg (ref 26.0–34.0)
MCHC: 32.3 g/dL (ref 30.0–36.0)
MCV: 86.6 fL (ref 80.0–100.0)
Monocytes Absolute: 0.5 10*3/uL (ref 0.1–1.0)
Monocytes Relative: 5 %
Neutro Abs: 5.9 10*3/uL (ref 1.7–7.7)
Neutrophils Relative %: 61 %
Platelets: 253 10*3/uL (ref 150–400)
RBC: 4.61 MIL/uL (ref 3.87–5.11)
RDW: 13.2 % (ref 11.5–15.5)
WBC: 9.6 10*3/uL (ref 4.0–10.5)
nRBC: 0 % (ref 0.0–0.2)

## 2021-09-05 LAB — COMPREHENSIVE METABOLIC PANEL
ALT: 45 U/L — ABNORMAL HIGH (ref 0–44)
AST: 30 U/L (ref 15–41)
Albumin: 4.2 g/dL (ref 3.5–5.0)
Alkaline Phosphatase: 69 U/L (ref 38–126)
Anion gap: 12 (ref 5–15)
BUN: 10 mg/dL (ref 6–20)
CO2: 22 mmol/L (ref 22–32)
Calcium: 9.6 mg/dL (ref 8.9–10.3)
Chloride: 100 mmol/L (ref 98–111)
Creatinine, Ser: 0.44 mg/dL (ref 0.44–1.00)
GFR, Estimated: >60 mL/min (ref >60)
Glucose, Bld: 248 mg/dL — ABNORMAL HIGH (ref 70–99)
Potassium: 3.6 mmol/L (ref 3.5–5.1)
Sodium: 134 mmol/L — ABNORMAL LOW (ref 135–145)
Total Bilirubin: 0.6 mg/dL (ref 0.3–1.2)
Total Protein: 8.2 g/dL — ABNORMAL HIGH (ref 6.5–8.1)

## 2021-09-05 LAB — URINALYSIS, ROUTINE W REFLEX MICROSCOPIC
Bacteria, UA: NONE SEEN
Bilirubin Urine: NEGATIVE
Glucose, UA: >=500 mg/dL — AB
Hgb urine dipstick: NEGATIVE
Ketones, ur: 5 mg/dL — AB
Leukocytes,Ua: NEGATIVE
Nitrite: NEGATIVE
Protein, ur: 30 mg/dL — AB
Specific Gravity, Urine: 1.037 — ABNORMAL HIGH (ref 1.005–1.030)
pH: 5 (ref 5.0–8.0)

## 2021-09-05 LAB — PREGNANCY, URINE: Preg Test, Ur: NEGATIVE

## 2021-09-05 LAB — LIPASE, BLOOD: Lipase: 28 U/L (ref 11–51)

## 2021-09-05 MED ORDER — ONDANSETRON HCL 4 MG/2ML IJ SOLN
4.0000 mg | Freq: Once | INTRAMUSCULAR | Status: AC
  Administered 2021-09-05: 4 mg via INTRAVENOUS
  Filled 2021-09-05: qty 2

## 2021-09-05 MED ORDER — MORPHINE SULFATE (PF) 4 MG/ML IV SOLN
4.0000 mg | Freq: Once | INTRAVENOUS | Status: AC
  Administered 2021-09-05: 4 mg via INTRAVENOUS
  Filled 2021-09-05: qty 1

## 2021-09-05 MED ORDER — SODIUM CHLORIDE 0.9 % IV BOLUS
1000.0000 mL | Freq: Once | INTRAVENOUS | Status: AC
  Administered 2021-09-05: 1000 mL via INTRAVENOUS

## 2021-09-05 MED ORDER — ONDANSETRON HCL 4 MG PO TABS: 4.0000 mg | ORAL_TABLET | Freq: Every day | ORAL | 0 refills | Status: AC | PRN

## 2021-09-05 MED ORDER — SUCRALFATE 1 GM/10ML PO SUSP: 1.0000 g | Freq: Four times a day (QID) | ORAL | 1 refills | Status: AC

## 2021-09-05 MED ORDER — OMEPRAZOLE MAGNESIUM 20 MG PO TBEC: 20.0000 mg | DELAYED_RELEASE_TABLET | Freq: Every day | ORAL | 1 refills | Status: AC

## 2021-09-05 MED ORDER — FAMOTIDINE IN NACL 20-0.9 MG/50ML-% IV SOLN
20.0000 mg | Freq: Once | INTRAVENOUS | Status: AC
  Administered 2021-09-05: 20 mg via INTRAVENOUS
  Filled 2021-09-05: qty 50

## 2021-09-05 MED ORDER — ACETAMINOPHEN 325 MG PO TABS
650.0000 mg | ORAL_TABLET | Freq: Once | ORAL | Status: AC
  Administered 2021-09-05: 650 mg via ORAL
  Filled 2021-09-05: qty 2

## 2021-09-05 MED ORDER — IOHEXOL 300 MG/ML  SOLN
100.0000 mL | Freq: Once | INTRAMUSCULAR | Status: AC | PRN
  Administered 2021-09-05: 100 mL via INTRAVENOUS

## 2021-09-05 MED ORDER — ALUM & MAG HYDROXIDE-SIMETH 200-200-20 MG/5ML PO SUSP
30.0000 mL | Freq: Once | ORAL | Status: AC
  Administered 2021-09-05: 30 mL via ORAL
  Filled 2021-09-05: qty 30

## 2021-09-05 NOTE — ED Provider Notes (Signed)
Patient complaining of improved abdominal pain after GI cocktail, fluids.  She denies any nausea or vomiting.  Pain is mild. Physical Exam  BP 133/80   Pulse 63   Temp 99.1 F (37.3 C) (Oral)   Resp 17   Ht 5' (1.524 m)   Wt 65.8 kg   SpO2 96%   BMI 28.32 kg/m   Physical Exam Abdomen soft nontender nondistended.  Patient in no distress.  Tolerating p.o. fluids well. Procedures  Procedures  ED Course / MDM    Medical Decision Making Amount and/or Complexity of Data Reviewed Labs: ordered. Radiology: ordered.  Risk OTC drugs. Prescription drug management.   46 year old female with abdominal pain.  Blood work, CT scan within normal limits.  Cardiac work-up negative.  Patient did see some relief with GI cocktail.  Will treat with omeprazole and sucralfate.  Prescription sent to the pharmacy.  She will follow-up PCP or GI specialist.  She understands signs and symptoms return to the ER for.       Sabrina Hanna 09/05/21 2137    Georga Hacking, MD 09/06/21 [phone]

## 2021-09-05 NOTE — ED Notes (Addendum)
Interpreter assisting this RN for d/c education. [id number].

## 2021-09-05 NOTE — ED Triage Notes (Signed)
Pt via POV from home. Pt c/o generalized abd pain. States it feels like a burning sensation. Pt has a hx of non-alcoholic pancreaitits, and states this feels similar. Denies NVD, urinary symptoms, or fever. Pt is A&OX4 and NAD  Spanish interpreter needed.

## 2021-09-05 NOTE — Discharge Instructions (Signed)
Please take medications as prescribed and return to the ER for any worsening symptoms or urgent changes in your health.

## 2021-09-05 NOTE — ED Provider Triage Note (Signed)
Emergency Medicine Provider Triage Evaluation Note  Sabrina Hanna , a 46 y.o. female  was evaluated in triage.  Pt complains of abdominal pain that began today. Feels like her pancreatitis. No N/V. H/o hysterectomy. Does not drink alcohol  Spanish speaking, using interpretor    Review of Systems  Positive: Abd pain Negative: Fever, N/V/D  Physical Exam  There were no vitals taken for this visit. Gen:   Awake, no distress   Resp:  Normal effort  MSK:   Moves extremities without difficulty  Other:    Medical Decision Making  Medically screening exam initiated at 4:18 PM.  Appropriate orders placed.  Konnie Cassells was informed that the remainder of the evaluation will be completed by another provider, this initial triage assessment does not replace that evaluation, and the importance of remaining in the ED until their evaluation is complete.    Jackelyn Hoehn, PA-C 09/05/21 1622

## 2021-09-05 NOTE — ED Provider Notes (Signed)
[facility] Provider Note    Event Date/Time   First MD Initiated Contact with Patient 09/05/21 1758     (approximate)   History   Abdominal Pain   HPI  Sabrina Hanna is a 46 y.o. female past medical history of diabetes, hypertriglyceridemia, pancreatitis who presents with abdominal pain.  Symptoms started today pain is diffuse describes it as burning and she feels distended.  Is constant has had decreased appetite nausea but no vomiting denies diarrhea constipation last BM was last night.  Denies urinary symptoms fevers chills.  Says this feels similar to when she had pancreatitis in the past.    Past Medical History:  Diagnosis Date   Diabetes mellitus without complication (HCC)    Hypertriglyceridemia    Pancreatitis     Patient Active Problem List   Diagnosis Date Noted   Abdominal pain 09/18/2020   DKA (diabetic ketoacidosis) (HCC) 02/23/2020   Pancreatitis 02/23/2020   HLD (hyperlipidemia)    GERD (gastroesophageal reflux disease)    Abnormal LFTs    Leukocytosis    Hypertriglyceridemia    Pneumonia due to COVID-19 virus 10/12/2019   Uncontrolled diabetes mellitus 04/24/2019   Pancreatitis, recurrent 06/28/2017   Acute pancreatitis without necrosis or infection, unspecified 06/17/2017   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 --       Excl. in GC? --     Most recent vital signs: Vitals:   09/05/21 1621 09/05/21 1813  BP: 124/87 120/80  Pulse: 87 80  Resp: 20 18  Temp: 99.1 F (37.3 C)   SpO2: 97% 98%     General: Awake, patient appears uncomfortable CV:  Good peripheral perfusion.  Resp:  Normal effort.  Abd:  Mildly distended, tender to palpation throughout but soft Neuro:             Awake, Alert, Oriented x 3  Other:     ED Results / Procedures / Treatments  Labs (all labs ordered are listed, but only abnormal results are displayed) Labs Reviewed  COMPREHENSIVE METABOLIC PANEL - Abnormal; Notable for the following components:      Result Value   Sodium 134 (*)    Glucose, Bld 248 (*)    Total Protein 8.2 (*)    ALT 45 (*)    All other components within normal limits  URINALYSIS, ROUTINE W REFLEX MICROSCOPIC - Abnormal; Notable for the following components:   Color, Urine YELLOW (*)    APPearance CLEAR (*)    Specific Gravity, Urine 1.037 (*)    Glucose, UA >=500 (*)    Ketones, ur 5 (*)  Protein, ur 30 (*)    All other components within normal limits  LIPASE, BLOOD  CBC WITH DIFFERENTIAL/PLATELET  PREGNANCY, URINE     EKG     RADIOLOGY I reviewed the CT abdomen pelvis and interpreted which shows no free air    PROCEDURES:  Critical Care performed: No  Procedures    MEDICATIONS ORDERED IN ED: Medications  famotidine (PEPCID) IVPB 20 mg premix (20 mg Intravenous New Bag/Given 09/05/21 1958)  alum & mag hydroxide-simeth (MAALOX/MYLANTA) 200-200-20 MG/5ML suspension 30 mL (has no administration in time range)  acetaminophen (TYLENOL) tablet 650 mg (650 mg Oral Given 09/05/21 1624)  morphine (PF) 4 MG/ML injection 4 mg (4 mg Intravenous Given 09/05/21 1857)  sodium chloride 0.9 % bolus 1,000 mL (1,000 mLs Intravenous New Bag/Given 09/05/21 1857)  ondansetron (ZOFRAN) injection 4 mg (4 mg Intravenous Given 09/05/21 1857)  iohexol (OMNIPAQUE) 300 MG/ML solution 100 mL (100 mLs  Intravenous Contrast Given 09/05/21 1909)     IMPRESSION / MDM / ASSESSMENT AND PLAN / ED COURSE  I reviewed the triage vital signs and the nursing notes.                              Patient's presentation is most consistent with acute presentation with potential threat to life or bodily function.  Differential diagnosis includes, but is not limited to, pancreatitis, bowel obstruction, bowel perforation, kidney stone, pyelonephritis, colitis, mesenteric ischemia  The patient is a 45 year old female presents with 1 day of abdominal pain nausea.  Pain is diffuse described as burning associated with distension.  Vitals are within normal limits.  Labs are overall reassuring she has no leukocytosis lipase and LFTs are normal UA does not suggest infection there is no significant hematuria.  Patient however looks uncomfortable she is mildly distended in her abdomen is tender throughout but exam is nonperitoneal.  Will obtain CT abdomen pelvis to rule out acute surgical process.  We will give IV morphine fluid bolus and Zofran.   Patient CT abdomen pelvis is essentially negative for acute pathology.  She has some dilated jejunal loops which could be normal versus mild ileus.  There is question of recently ruptured right-sided ovarian cyst which I do not think is related to her symptoms.  There is no significant intra-abdominal free fluid.  On reassessment patient still continuing to describe a burning sensation throughout her abdomen.  Suspect that this is potentially GERD/gastritis.  Will treat with Pepcid and GI cocktail.  Patient is signed out to oncoming provider pending reassessment.   FINAL CLINICAL IMPRESSION(S) / ED DIAGNOSES   Final diagnoses:  Generalized abdominal pain     Rx / DC Orders   ED Discharge Orders     None        Note:  This document was prepared using Dragon voice recognition software and may include unintentional dictation errors.   Georga Hacking,  MD 09/05/21 [phone]

## 2021-12-26 ENCOUNTER — Other Ambulatory Visit: Payer: Self-pay

## 2021-12-26 ENCOUNTER — Emergency Department
Admission: EM | Admit: 2021-12-26 | Discharge: 2021-12-26 | Disposition: A | Discharge status: Home / Self Care | Payer: Medicaid Other | Attending: Emergency Medicine | Admitting: Emergency Medicine

## 2021-12-26 ENCOUNTER — Emergency Department: Payer: Medicaid Other

## 2021-12-26 DIAGNOSIS — R519 Headache, unspecified: Secondary | ICD-10-CM | POA: Diagnosis present

## 2021-12-26 DIAGNOSIS — E1165 Type 2 diabetes mellitus with hyperglycemia: Secondary | ICD-10-CM | POA: Insufficient documentation

## 2021-12-26 DIAGNOSIS — R739 Hyperglycemia, unspecified: Secondary | ICD-10-CM

## 2021-12-26 DIAGNOSIS — Z8616 Personal history of COVID-19: Secondary | ICD-10-CM | POA: Diagnosis not present

## 2021-12-26 LAB — BASIC METABOLIC PANEL
Anion gap: 10 (ref 5–15)
BUN: 7 mg/dL (ref 6–20)
CO2: 20 mmol/L — ABNORMAL LOW (ref 22–32)
Calcium: 8.8 mg/dL — ABNORMAL LOW (ref 8.9–10.3)
Chloride: 107 mmol/L (ref 98–111)
Creatinine, Ser: 0.5 mg/dL (ref 0.44–1.00)
GFR, Estimated: >60 mL/min (ref >60)
Glucose, Bld: 253 mg/dL — ABNORMAL HIGH (ref 70–99)
Potassium: 3.8 mmol/L (ref 3.5–5.1)
Sodium: 137 mmol/L (ref 135–145)

## 2021-12-26 LAB — URINALYSIS, ROUTINE W REFLEX MICROSCOPIC
Bilirubin Urine: NEGATIVE
Glucose, UA: >=500 mg/dL — AB
Ketones, ur: 5 mg/dL — AB
Leukocytes,Ua: NEGATIVE
Nitrite: NEGATIVE
Protein, ur: 30 mg/dL — AB
Specific Gravity, Urine: 1.03 (ref 1.005–1.030)
pH: 6 (ref 5.0–8.0)

## 2021-12-26 LAB — CBC
HCT: 40.6 % (ref 36.0–46.0)
Hemoglobin: 13.4 g/dL (ref 12.0–15.0)
MCH: 29.1 pg (ref 26.0–34.0)
MCHC: 33 g/dL (ref 30.0–36.0)
MCV: 88.3 fL (ref 80.0–100.0)
Platelets: 248 10*3/uL (ref 150–400)
RBC: 4.6 MIL/uL (ref 3.87–5.11)
RDW: 12.8 % (ref 11.5–15.5)
WBC: 9 10*3/uL (ref 4.0–10.5)
nRBC: 0 % (ref 0.0–0.2)

## 2021-12-26 LAB — POC URINE PREG, ED: Preg Test, Ur: NEGATIVE

## 2021-12-26 LAB — CBG MONITORING, ED: Glucose-Capillary: 267 mg/dL — ABNORMAL HIGH (ref 70–99)

## 2021-12-26 MED ORDER — KETOROLAC TROMETHAMINE 15 MG/ML IJ SOLN
15.0000 mg | Freq: Once | INTRAMUSCULAR | Status: AC
  Administered 2021-12-26: 15 mg via INTRAVENOUS
  Filled 2021-12-26: qty 1

## 2021-12-26 MED ORDER — METOCLOPRAMIDE HCL 5 MG/ML IJ SOLN
10.0000 mg | Freq: Once | INTRAMUSCULAR | Status: AC
  Administered 2021-12-26: 10 mg via INTRAVENOUS
  Filled 2021-12-26: qty 2

## 2021-12-26 MED ORDER — SODIUM CHLORIDE 0.9 % IV BOLUS
1000.0000 mL | Freq: Once | INTRAVENOUS | Status: AC
  Administered 2021-12-26: 1000 mL via INTRAVENOUS

## 2021-12-26 NOTE — ED Provider Notes (Signed)
[facility] Provider Note    Event Date/Time   First MD Initiated Contact with Patient 12/26/21 1638     (approximate)   History   Headache and Hyperglycemia   HPI  Sabrina Hanna is a 46 y.o. female with a past medical history of diabetes, DKA, pancreatitis, GERD, hyperlipidemia who presents today for evaluation of headache and hyperglycemia.  Patient reports that her headache began yesterday, gradually and has worsened today.  She has not taken anything for her symptoms.  She reports that her urine is very strong smelling, but no burning with urination.  She has not had any fevers or chills.  No neck pain.  No double vision.  No trauma to her head.  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      Excl. in GC?     Most recent vital signs: Vitals:   12/26/21 1513  BP: 128/84  Pulse: 97  Resp: 18  Temp: 98.3 F (36.8 C)  SpO2: 98%    Physical Exam Vitals and nursing note reviewed.  Constitutional:       General: Awake and alert. No acute distress.    Appearance: Normal appearance. The patient is normal weight.  HENT:     Head: Normocephalic and atraumatic.     Mouth: Mucous membranes are moist.  Eyes:     General: PERRL. Normal EOMs        Right eye: No discharge.        Left eye: No discharge.     Conjunctiva/sclera: Conjunctivae normal.  Cardiovascular:     Rate and Rhythm: Normal rate and regular rhythm.     Pulses: Normal pulses.     Heart sounds: Normal heart sounds Pulmonary:     Effort: Pulmonary effort is normal. No respiratory distress.     Breath sounds: Normal breath sounds.  Abdominal:     Abdomen is soft. There is no abdominal tenderness. No rebound or guarding. No distention. Musculoskeletal:        General: No swelling. Normal range of motion.     Cervical back: Normal range of motion and neck supple.  Skin:    General: Skin is warm and dry.     Capillary Refill: Capillary refill takes less  than 2 seconds.     Findings: No rash.  Neurological:     Mental Status: The patient is awake and alert.  Neurological: GCS 15 alert and oriented x3 Normal speech, no expressive or receptive aphasia or dysarthria Cranial nerves II through XII intact Normal visual fields 5 out of 5 strength in all 4 extremities with intact sensation throughout No extremity drift Normal finger-to-nose testing, no limb or truncal ataxia     ED Results / Procedures / Treatments   Labs (all labs ordered are listed, but only abnormal results are displayed) Labs Reviewed  BASIC METABOLIC PANEL - Abnormal; Notable for the following components:      Result Value   CO2 20 (*)    Glucose, Bld 253 (*)    Calcium 8.8 (*)    All other components within normal limits  URINALYSIS, ROUTINE W REFLEX MICROSCOPIC - Abnormal; Notable for the following components:   Color, Urine YELLOW (*)    APPearance HAZY (*)    Glucose, UA >=500 (*)    Hgb urine dipstick SMALL (*)    Ketones, ur 5 (*)     Protein, ur 30 (*)    Bacteria, UA RARE (*)    All other components within normal limits  CBG MONITORING, ED - Abnormal; Notable for the following components:   Glucose-Capillary 267 (*)    All other components within normal limits  RESP PANEL BY RT-PCR (FLU A&B, COVID) ARPGX2  CBC  POC URINE PREG, ED  CBG MONITORING, ED  CBG MONITORING, ED     EKG     RADIOLOGY I independently reviewed and interpreted imaging and agree with radiologists findings.     PROCEDURES:  Critical Care performed:   Procedures   MEDICATIONS ORDERED IN ED: Medications  sodium chloride 0.9 % bolus 1,000 mL (1,000 mLs Intravenous New Bag/Given 12/26/21 1839)  metoCLOPramide (REGLAN) injection 10 mg (10 mg Intravenous Given 12/26/21 1839)  ketorolac (TORADOL) 15 MG/ML injection 15 mg (15 mg Intravenous Given 12/26/21 1840)     IMPRESSION / MDM / ASSESSMENT AND PLAN / ED COURSE  I reviewed the triage vital signs and the nursing notes.   Differential diagnosis includes, but is not limited to, hyperglycemia, DKA, hypertension, URI.  Patient is awake and alert, hemodynamically stable and afebrile.  Her headache was gradual in onset, without history or physical exam findings to suggest encephalopathy; no altered mental status, fever or meningismus, vision changes, vomiting or focal neurological deficit and improved with treatment in the emergency department. Therefore, I have low suspicion for concerning process that would require urgent or emergent imaging or diagnostic/therapeutic procedural intervention such as lumbar puncture. Doubt meningitis as there is no fever, photophobia, neck symptoms, altered mental status. Additionally the patient is not known to be immunocompromised. No history of trauma, doubt subdural or epidural hematoma. No dizziness or other neurologic symptoms so cerebellar infarction or other hemorrhagic stroke are unlikely. Intracranial mass unlikely given that the headache is not  getting progressively worse, is not worse in the morning, there are no other neurologic symptoms, and the neurologic exam is grossly normal. Unlikely to be giant cell arteritis as there is no tenderness over temporal artery or vision changes. Doubt CO toxicity as no known exposure and no other family members have a headache. No neck pain and was not sudden onset or associated with movement of the neck and no dizziness,  doubt carotid artery dissection. No occipital tenderness so occipital neuralgia seems less likely.  Labs are overall reassuring.  Patient was found to be hyperglycemic, though no increased anion gap, do not suspect DKA.  She was treated with a liter of normal saline, Toradol, and Reglan.  Upon reevaluation, she reports that her headache has resolved.  We discussed the importance of maintaining control of her glucose levels, and she instructed to follow-up with her outpatient provider for further management.  Return precautions discussed, patient to follow-up closely with outpatient provider.  Patient understands and agrees with plan.  She was discharged in stable condition.  Full history, exam, and recommendations/findings were discussed with the use of the Spanish interpreter.  Patient's presentation is most consistent with acute complicated illness / injury requiring diagnostic workup.   Clinical Course as of 12/26/21 1855  Thu Dec 26, 2021  1853 Patient reexamined with interpreter.  Patient reports that she is significantly improved and is ready to be discharged [JP]    Clinical Course User Index [JP] Amontae Ng, Clarnce Flock, PA-C     FINAL CLINICAL IMPRESSION(S) / ED DIAGNOSES   Final diagnoses:  Hyperglycemia  Acute nonintractable headache, unspecified headache type     Rx / DC Orders   ED Discharge Orders     None        Note:  This document was prepared using Dragon voice recognition software and may include unintentional dictation errors.   Marquette Old,  PA-C 12/26/21 1859    Lucillie Garfinkel, MD 12/27/21 2023

## 2021-12-26 NOTE — ED Triage Notes (Signed)
Pt comes with c/o headache and hyperglycemia. Pt states her CBG was 390. Pt states headache that started yesterday. Pt also states she had been shakey.

## 2021-12-26 NOTE — Discharge Instructions (Signed)
Your CT scan was normal.  Your blood test showed hyperglycemia, though you are not in DKA.  You were given medicines to help bring down your blood sugar and also help your headache.  Please return for any new, worsening, or change in symptoms or other concerns.  Please follow-up with your outpatient provider as soon as possible to make sure that your glucose is under control.  It was a pleasure caring for you today.

## 2022-11-14 ENCOUNTER — Other Ambulatory Visit: Payer: Self-pay

## 2022-11-14 ENCOUNTER — Emergency Department
Admission: EM | Admit: 2022-11-14 | Discharge: 2022-11-14 | Disposition: A | Discharge status: Home / Self Care | Payer: Medicaid Other | Attending: Emergency Medicine | Admitting: Emergency Medicine

## 2022-11-14 ENCOUNTER — Emergency Department: Payer: Medicaid Other

## 2022-11-14 DIAGNOSIS — G4486 Cervicogenic headache: Secondary | ICD-10-CM | POA: Diagnosis not present

## 2022-11-14 DIAGNOSIS — R42 Dizziness and giddiness: Secondary | ICD-10-CM | POA: Insufficient documentation

## 2022-11-14 DIAGNOSIS — R519 Headache, unspecified: Secondary | ICD-10-CM | POA: Diagnosis present

## 2022-11-14 LAB — CBC
HCT: 40.7 % (ref 36.0–46.0)
Hemoglobin: 13.2 g/dL (ref 12.0–15.0)
MCH: 29.5 pg (ref 26.0–34.0)
MCHC: 32.4 g/dL (ref 30.0–36.0)
MCV: 91.1 fL (ref 80.0–100.0)
Platelets: 211 10*3/uL (ref 150–400)
RBC: 4.47 MIL/uL (ref 3.87–5.11)
RDW: 12.6 % (ref 11.5–15.5)
WBC: 10.9 10*3/uL — ABNORMAL HIGH (ref 4.0–10.5)
nRBC: 0 % (ref 0.0–0.2)

## 2022-11-14 LAB — COMPREHENSIVE METABOLIC PANEL
ALT: 23 U/L (ref 0–44)
AST: 26 U/L (ref 15–41)
Albumin: 4 g/dL (ref 3.5–5.0)
Alkaline Phosphatase: 70 U/L (ref 38–126)
Anion gap: 11 (ref 5–15)
BUN: 9 mg/dL (ref 6–20)
CO2: 24 mmol/L (ref 22–32)
Calcium: 8.8 mg/dL — ABNORMAL LOW (ref 8.9–10.3)
Chloride: 102 mmol/L (ref 98–111)
Creatinine, Ser: 0.43 mg/dL — ABNORMAL LOW (ref 0.44–1.00)
GFR, Estimated: >60 mL/min (ref >60)
Glucose, Bld: 279 mg/dL — ABNORMAL HIGH (ref 70–99)
Potassium: 3.7 mmol/L (ref 3.5–5.1)
Sodium: 137 mmol/L (ref 135–145)
Total Bilirubin: 0.7 mg/dL (ref 0.3–1.2)
Total Protein: 7.6 g/dL (ref 6.5–8.1)

## 2022-11-14 MED ORDER — CYCLOBENZAPRINE HCL 10 MG PO TABS
5.0000 mg | ORAL_TABLET | Freq: Once | ORAL | Status: AC
  Administered 2022-11-14: 5 mg via ORAL
  Filled 2022-11-14: qty 1

## 2022-11-14 MED ORDER — KETOROLAC TROMETHAMINE 60 MG/2ML IM SOLN
60.0000 mg | Freq: Once | INTRAMUSCULAR | Status: AC
  Administered 2022-11-14: 60 mg via INTRAMUSCULAR
  Filled 2022-11-14: qty 2

## 2022-11-14 MED ORDER — CYCLOBENZAPRINE HCL 5 MG PO TABS: 5.0000 mg | ORAL_TABLET | Freq: Three times a day (TID) | ORAL | 0 refills | Status: AC | PRN

## 2022-11-14 MED ORDER — KETOROLAC TROMETHAMINE 10 MG PO TABS: 10.0000 mg | ORAL_TABLET | Freq: Four times a day (QID) | ORAL | 0 refills | Status: AC | PRN

## 2022-11-14 NOTE — ED Triage Notes (Addendum)
Pt here with dizziness and a headache that has been going since Sept 1st. Pt states she went to work today and had to leave due to the headache pain. Pt denies fall or injury. Pt denies hx of migraines. Pt has hx of DM.

## 2022-11-14 NOTE — ED Provider Notes (Signed)
[facility] Provider Note   Event Date/Time   First MD Initiated Contact with Patient 11/14/22 1156     (approximate) History  Dizziness and Headache  HPI Sabrina Hanna is a 47 y.o. female with no stated past medical history who presents complaining of bilateral neck and posterior head pain is been present over the last 2 days and is described as 7/10 in severity and unrelenting.  Patient states that it feels somewhat like a throbbing pain or tightness to the posterior scalp where.  Patient denies any exacerbating relieving factors. ROS: Patient currently denies any vision changes, tinnitus, difficulty speaking, facial droop, sore throat, chest pain, shortness of breath, abdominal pain, nausea/vomiting/diarrhea, dysuria, or weakness/numbness/paresthesias in any extremity   Physical Exam  Triage Vital Signs: ED Triage Vitals  Encounter Vitals Group     BP 11/14/22 1104 (!) 122/90     Systolic BP Percentile --      Diastolic BP Percentile --      Pulse Rate 11/14/22 1104 88     Resp 11/14/22 1104 17     Temp 11/14/22 1104 98.1 F (36.7 C)     Temp Source 11/14/22 1104 Oral     SpO2 11/14/22 1104 97 %     Weight 11/14/22 1105 145 lb 1 oz (65.8 kg)     Height 11/14/22 1105 5' (1.524 m)     Head Circumference --      Peak Flow --      Pain Score 11/14/22 1105 7     Pain Loc --      Pain Education --      Exclude from Growth Chart --    Most recent vital signs: Vitals:   11/14/22 1104  BP: (!) 122/90  Pulse: 88  Resp: 17  Temp: 98.1 F (36.7 C)  SpO2: 97%   General: Awake, oriented x4. CV:  Good peripheral perfusion.  Resp:  Normal effort.  Abd:  No distention.  Other:  Middle-aged overweight Hispanic female laying resting comfortably in no acute distress ED Results / Procedures / Treatments  Labs (all labs ordered are listed, but only abnormal results are displayed) Labs Reviewed  CBC - Abnormal; Notable for the following components:       Result Value   WBC 10.9 (*)    All other components within normal limits  COMPREHENSIVE METABOLIC PANEL - Abnormal; Notable for the following components:   Glucose, Bld 279 (*)    Creatinine, Ser 0.43 (*)    Calcium 8.8 (*)    All other components within normal limits   EKG ED ECG REPORT I, Merwyn Katos, the attending physician, personally viewed and interpreted this ECG. Date: 11/14/2022 EKG Time: 1106 Rate: 84 Rhythm: normal sinus rhythm QRS Axis: normal Intervals: normal ST/T Wave abnormalities: normal Narrative Interpretation: no evidence of acute ischemia RADIOLOGY ED MD interpretation: CT of the head without contrast interpreted by me shows no evidence of acute abnormalities including no intracerebral hemorrhage, obvious masses, or significant edema -Agree with radiology assessment Official radiology report(s): CT HEAD WO CONTRAST ( )  Result Date: 11/14/2022 CLINICAL DATA:  47 year old female with persistent dizziness and headache this month. EXAM: CT HEAD WITHOUT CONTRAST TECHNIQUE: Contiguous axial images were obtained from the base of the skull through the vertex without intravenous contrast. RADIATION DOSE REDUCTION: This exam was performed according to the departmental dose-optimization program which includes automated exposure control, adjustment of the mA and/or kV according to patient size and/or use of  iterative reconstruction technique. COMPARISON:  Head CT 12/26/2021. FINDINGS: Brain: Cerebral volume is stable and within normal limits. No midline shift, ventriculomegaly, mass effect, evidence of mass lesion, intracranial hemorrhage or evidence of cortically based acute infarction. Gray-white matter differentiation is within normal limits throughout the brain. Vascular: No suspicious intracranial vascular hyperdensity. Skull: Intact, negative. Sinuses/Orbits: Visualized paranasal sinuses and mastoids are clear. Tympanic cavities are clear. Other: Disconjugate gaze  but otherwise negative orbit and scalp soft tissues. IMPRESSION: Disconjugate gaze but otherwise Normal noncontrast Head CT. Electronically Signed   By: Odessa Fleming M.D.   On: 11/14/2022 11:33   PROCEDURES: Critical Care performed: No Procedures MEDICATIONS ORDERED IN ED: Medications  ketorolac (TORADOL) injection 60 mg (60 mg Intramuscular Given 11/14/22 1242)  cyclobenzaprine (FLEXERIL) tablet 5 mg (5 mg Oral Given 11/14/22 1242)   IMPRESSION / MDM / ASSESSMENT AND PLAN / ED COURSE  I reviewed the triage vital signs and the nursing notes.                             The patient is on the cardiac monitor to evaluate for evidence of arrhythmia and/or significant heart rate changes. Patient's presentation is most consistent with acute presentation with potential threat to life or bodily function. This patient presents with a headache most consistent with benign headache from either tension type headache vs migraine. No headache red flags. Neurologic exam without evidence of meningismus, AMS, focal neurologic findings so doubt meningitis, encephalitis, stroke. Presentation not consistent with acute intracranial bleed to include SAH (lack of risk factors, headache history). No history of trauma so doubt ICH. Given history and physical temporal arteritis unlikely, as is acute angle closure glaucoma. Doubt carotid artery dissection given no focal neuro deficits, no neck trauma or recent neck strain. Patient with no signs of increased intracranial pressure or weight loss and history and physical suggest more benign headache so less likely mass effect in brain from tumor or abscess or idiopathic intracranial hypertension. Pain was controlled with headache cocktail and patient discharged home with PMD follow up.   FINAL CLINICAL IMPRESSION(S) / ED DIAGNOSES   Final diagnoses:  Cervicogenic headache   Rx / DC Orders   ED Discharge Orders          Ordered    ketorolac (TORADOL) 10 MG tablet  Every 6 hours  PRN       Note to Pharmacy: Patient given an IM/IV loading dose in emergency department   11/14/22 1217    cyclobenzaprine (FLEXERIL) 5 MG tablet  3 times daily PRN        11/14/22 1217           Note:  This document was prepared using Dragon voice recognition software and may include unintentional dictation errors.   Merwyn Katos, MD 11/14/22 [phone]

## 2023-07-26 ENCOUNTER — Emergency Department
Admission: EM | Admit: 2023-07-26 | Discharge: 2023-07-26 | Disposition: A | Discharge status: Home / Self Care | Attending: Emergency Medicine | Admitting: Emergency Medicine

## 2023-07-26 DIAGNOSIS — E1065 Type 1 diabetes mellitus with hyperglycemia: Secondary | ICD-10-CM | POA: Insufficient documentation

## 2023-07-26 DIAGNOSIS — R739 Hyperglycemia, unspecified: Secondary | ICD-10-CM

## 2023-07-26 DIAGNOSIS — Z794 Long term (current) use of insulin: Secondary | ICD-10-CM | POA: Insufficient documentation

## 2023-07-26 DIAGNOSIS — Z7984 Long term (current) use of oral hypoglycemic drugs: Secondary | ICD-10-CM | POA: Insufficient documentation

## 2023-07-26 DIAGNOSIS — R519 Headache, unspecified: Secondary | ICD-10-CM | POA: Insufficient documentation

## 2023-07-26 LAB — CBC
HCT: 39.2 % (ref 36.0–46.0)
Hemoglobin: 12.7 g/dL (ref 12.0–15.0)
MCH: 29.4 pg (ref 26.0–34.0)
MCHC: 32.4 g/dL (ref 30.0–36.0)
MCV: 90.7 fL (ref 80.0–100.0)
Platelets: 240 10*3/uL (ref 150–400)
RBC: 4.32 MIL/uL (ref 3.87–5.11)
RDW: 12.4 % (ref 11.5–15.5)
WBC: 9.8 10*3/uL (ref 4.0–10.5)
nRBC: 0 % (ref 0.0–0.2)

## 2023-07-26 LAB — BASIC METABOLIC PANEL WITH GFR
Anion gap: 8 (ref 5–15)
BUN: 11 mg/dL (ref 6–20)
CO2: 24 mmol/L (ref 22–32)
Calcium: 9.4 mg/dL (ref 8.9–10.3)
Chloride: 105 mmol/L (ref 98–111)
Creatinine, Ser: 0.57 mg/dL (ref 0.44–1.00)
GFR, Estimated: >60 mL/min (ref >60)
Glucose, Bld: 364 mg/dL — ABNORMAL HIGH (ref 70–99)
Potassium: 3.9 mmol/L (ref 3.5–5.1)
Sodium: 137 mmol/L (ref 135–145)

## 2023-07-26 LAB — CBG MONITORING, ED
Glucose-Capillary: 139 mg/dL — ABNORMAL HIGH (ref 70–99)
Glucose-Capillary: 382 mg/dL — ABNORMAL HIGH (ref 70–99)

## 2023-07-26 MED ORDER — SODIUM CHLORIDE 0.9 % IV BOLUS (SEPSIS)
1000.0000 mL | Freq: Once | INTRAVENOUS | Status: AC
  Administered 2023-07-26: 1000 mL via INTRAVENOUS

## 2023-07-26 MED ORDER — PROCHLORPERAZINE EDISYLATE 10 MG/2ML IJ SOLN
10.0000 mg | Freq: Once | INTRAMUSCULAR | Status: AC
  Administered 2023-07-26: 10 mg via INTRAVENOUS
  Filled 2023-07-26: qty 2

## 2023-07-26 MED ORDER — KETOROLAC TROMETHAMINE 30 MG/ML IJ SOLN
30.0000 mg | Freq: Once | INTRAMUSCULAR | Status: AC
  Administered 2023-07-26: 30 mg via INTRAVENOUS
  Filled 2023-07-26: qty 1

## 2023-07-26 NOTE — ED Triage Notes (Signed)
 Pt to ED via POV C/O hyperglycemia. Pt reports around an hr ago checked her sugar and it was 354. Gave herself some insulin . Checked again about 30 mins checked again and it was 410. Was hositalized about a month ago in [place]  for DKA. Pt denies any abd pain, N/VD, cp, sob. Endorses headache.

## 2023-07-26 NOTE — ED Notes (Signed)
CBG 139. 

## 2023-07-26 NOTE — ED Provider Notes (Signed)
 [facility] Provider Note    Event Date/Time   First MD Initiated Contact with Patient 07/26/23 [phone]     (approximate)   History   Hyperglycemia   HPI  Sabrina Hanna is a 48 y.o. female with history of insulin -dependent diabetes, hypertriglyceridemia, pancreatitis who presents to the emergency department with complaints of hyperglycemia.  States her blood sugar at home was 410.  Reports she has been taking her Humalog  and metformin  as prescribed.  She has not had any diet changes but does state that she is no longer exercising and is wondering if this is causing her blood sugars to rise.  No vomiting or diarrhea.  Also complaining of generalized headache.  No head injury.  Not on blood thinners.  No fever or meningismus.  No new numbness, tingling or weakness.   History provided by patient using Spanish interpreter.    Past Medical History:  Diagnosis Date   Diabetes mellitus without complication (HCC)    Hypertriglyceridemia    Pancreatitis     Past Surgical History:  Procedure Laterality Date   ABDOMINAL HYSTERECTOMY     LAPAROSCOPIC UNILATERAL SALPINGO OOPHERECTOMY Right     MEDICATIONS:  Prior to Admission medications   Medication Sig Start Date End Date Taking? Authorizing Provider  dicyclomine  (BENTYL ) 20 MG tablet Take 1 tablet (20 mg total) by mouth 4 (four) times daily -  before meals and at bedtime. 09/23/20 10/23/20  Tiajuana Fluke, MD  fenofibrate  (TRICOR ) 145 MG tablet Take 145 mg by mouth daily. 09/13/20   [provider]  gabapentin  (NEURONTIN ) 300 MG capsule Take 1 capsule (300 mg total) by mouth 3 (three) times daily for 14 days. 09/23/20 10/07/20  Tiajuana Fluke, MD  HUMALOG  100 UNIT/ML injection Inject 12-16 Units into the skin 3 (three) times daily. 09/13/20   [provider]  insulin  aspart (NOVOLOG  FLEXPEN) 100 UNIT/ML FlexPen Inject 5-15 Units into the skin 3 (three) times daily with meals. Uses  sliding scale 10/16/19   Ozell Blunt, MD  ketorolac  (TORADOL ) 10 MG tablet Take 1 tablet (10 mg total) by mouth every 6 (six) hours as needed. 11/14/22   Bradler, Evan K, MD  LANTUS  SOLOSTAR 100 UNIT/ML Solostar Pen Inject 40 Units into the skin every evening. 09/13/20   [provider]  metFORMIN  (GLUCOPHAGE ) 500 MG tablet Take 1,000 mg by mouth 2 (two) times daily. 07/24/19   [provider]  omeprazole  (PRILOSEC  OTC) 20 MG tablet Take 1 tablet (20 mg total) by mouth daily. 09/05/21 09/05/22  Coralyn Derry, PA-C  pantoprazole  (PROTONIX ) 40 MG tablet Take 1 tablet (40 mg total) by mouth 2 (two) times daily before a meal. 09/23/20 10/23/20  Margery Sheets B, MD  polyethylene glycol (MIRALAX  / GLYCOLAX ) 17 g packet Take 17 g by mouth daily. 09/23/20   Tiajuana Fluke, MD  rosuvastatin (CRESTOR) 40 MG tablet Take 40 mg by mouth daily. 09/12/20   [provider]  simethicone  (MYLICON) 80 MG chewable tablet Chew 1 tablet (80 mg total) by mouth 4 (four) times daily. 09/23/20   Tiajuana Fluke, MD  sucralfate  (CARAFATE ) 1 GM/10ML suspension Take 10 mLs (1 g total) by mouth 4 (four) times daily. 09/05/21 09/05/22  Coralyn Derry, PA-C    Physical Exam   Triage Vital Signs: ED Triage Vitals  Encounter Vitals Group     BP 07/26/23 0024 (!) 101/59     Systolic BP Percentile --  Diastolic BP Percentile --      Pulse Rate 07/26/23 0024 69     Resp 07/26/23 0024 18     Temp 07/26/23 0024 98.6 F (37 C)     Temp src --      SpO2 07/26/23 0024 99 %     Weight --      Height --      Head Circumference --      Peak Flow --      Pain Score 07/26/23 0226 10     Pain Loc --      Pain Education --      Exclude from Growth Chart --     Most recent vital signs: Vitals:   07/26/23 0231 07/26/23 0429  BP: 130/81 121/79  Pulse: 73 78  Resp: 16 16  Temp:  98 F (36.7 C)  SpO2: 96% 98%    CONSTITUTIONAL: Alert, responds appropriately to questions.  Well-appearing; well-nourished HEAD: Normocephalic, atraumatic EYES: Conjunctivae clear, pupils appear equal, sclera nonicteric ENT: normal nose; moist mucous membranes NECK: Supple, normal ROM, no meningismus CARD: RRR; S1 and S2 appreciated RESP: Normal chest excursion without splinting or tachypnea; breath sounds clear and equal bilaterally; no wheezes, no rhonchi, no rales, no hypoxia or respiratory distress, speaking full sentences ABD/GI: Non-distended; soft, non-tender, no rebound, no guarding, no peritoneal signs BACK: The back appears normal EXT: Normal ROM in all joints; no deformity noted, no edema SKIN: Normal color for age and race; warm; no rash on exposed skin NEURO: Moves all extremities equally, normal speech, normal sensation, normal gait, no facial asymmetry PSYCH: The patient's mood and manner are appropriate.   ED Results / Procedures / Treatments   LABS: (all labs ordered are listed, but only abnormal results are displayed) Labs Reviewed  BASIC METABOLIC PANEL WITH GFR - Abnormal; Notable for the following components:      Result Value   Glucose, Bld 364 (*)    All other components within normal limits  CBG MONITORING, ED - Abnormal; Notable for the following components:   Glucose-Capillary 382 (*)    All other components within normal limits  CBG MONITORING, ED - Abnormal; Notable for the following components:   Glucose-Capillary 139 (*)    All other components within normal limits  CBC  CBG MONITORING, ED     EKG:     RADIOLOGY: My personal review and interpretation of imaging:    I have personally reviewed all radiology reports.   No results found.   PROCEDURES:  Critical Care performed: No      Procedures    IMPRESSION / MDM / ASSESSMENT AND PLAN / ED COURSE  I reviewed the triage vital signs and the nursing notes.    Patient here with hyperglycemia, headache.    DIFFERENTIAL DIAGNOSIS (includes but not limited to):    Hyperglycemia, DKA, doubt HHS.  Tension headache, migraine headache, doubt CVT, CVA, intracranial hemorrhage, meningitis.   Patient's presentation is most consistent with acute presentation with potential threat to life or bodily function.   PLAN: Labs from triage show blood glucose of 364.  Normal bicarb and anion gap.  Patient not in DKA.  Normal white count, hemoglobin.  Will give IV fluids and recheck blood sugar.  Will give Toradol , Compazine for headache.  No indication for emergent imaging.   MEDICATIONS GIVEN IN ED: Medications  sodium chloride  0.9 % bolus 1,000 mL (0 mLs Intravenous Stopped 07/26/23 0430)  sodium chloride  0.9 % bolus 1,000 mL (  0 mLs Intravenous Stopped 07/26/23 0430)  ketorolac  (TORADOL ) 30 MG/ML injection 30 mg (30 mg Intravenous Given 07/26/23 0257)  prochlorperazine (COMPAZINE) injection 10 mg (10 mg Intravenous Given 07/26/23 0256)     ED COURSE: Blood sugar improved to 139.  Patient feeling better and headache is now down to a 2/10.  I feel she is safe for discharge with follow-up with her doctor.   At this time, I do not feel there is any life-threatening condition present. I reviewed all nursing notes, vitals, pertinent previous records.  All lab and urine results, EKGs, imaging ordered have been independently reviewed and interpreted by myself.  I reviewed all available radiology reports from any imaging ordered this visit.  Based on my assessment, I feel the patient is safe to be discharged home without further emergent workup and can continue workup as an outpatient as needed. Discussed all findings, treatment plan as well as usual and customary return precautions.  They verbalize understanding and are comfortable with this plan.  Outpatient follow-up has been provided as needed.  All questions have been answered.   CONSULTS: Admission considered but patient not in DKA, low suspicion for HHS.   OUTSIDE RECORDS REVIEWED: Reviewed prior admission for DKA on  06/08/2023.       FINAL CLINICAL IMPRESSION(S) / ED DIAGNOSES   Final diagnoses:  Hyperglycemia  Generalized headache     Rx / DC Orders   ED Discharge Orders     None        Note:  This document was prepared using Dragon voice recognition software and may include unintentional dictation errors.   Alyscia Carmon, Clover Dao, DO 07/26/23 [phone]

## 2023-07-26 NOTE — Discharge Instructions (Addendum)
 You may alternate Tylenol 1000 mg every 6 hours as needed for pain, fever and Ibuprofen 800 mg every 6-8 hours as needed for pain, fever.  Please take Ibuprofen with food.  Do not take more than 4000 mg of Tylenol (acetaminophen) in a 24 hour period.    Puede alternar Tylenol 1000 mg cada 6 horas, segn sea necesario, para el dolor y la fiebre, e ibuprofeno 800 mg cada 6-8 horas, segn sea necesario, para el dolor y Insurance account manager. Tome el ibuprofeno con alimentos. No tome ms de 4000 mg de Tylenol (paracetamol) en un perodo de 24 horas.

## 2023-08-14 ENCOUNTER — Encounter: Payer: Self-pay | Admitting: Internal Medicine

## 2023-09-17 ENCOUNTER — Encounter: Payer: Self-pay | Admitting: Internal Medicine

## 2023-09-29 ENCOUNTER — Ambulatory Visit: Admission: RE | Admit: 2023-09-29 | Payer: Self-pay | Source: Home / Self Care | Admitting: Internal Medicine

## 2023-09-29 ENCOUNTER — Encounter: Admission: RE | Payer: Self-pay | Source: Home / Self Care

## 2023-09-29 SURGERY — COLONOSCOPY
Anesthesia: General
